# Patient Record
Sex: Male | Born: 1955 | Race: White | Hispanic: No | Smoking: Light tobacco smoker
Health system: Southern US, Community
[De-identification: ages and names within clinical notes are randomized; demographics above are authoritative.]

## PROBLEM LIST (undated history)

## (undated) DIAGNOSIS — G039 Meningitis, unspecified: Secondary | ICD-10-CM

## (undated) DIAGNOSIS — Z720 Tobacco use: Secondary | ICD-10-CM

## (undated) DIAGNOSIS — E039 Hypothyroidism, unspecified: Secondary | ICD-10-CM

## (undated) DIAGNOSIS — J189 Pneumonia, unspecified organism: Secondary | ICD-10-CM

## (undated) DIAGNOSIS — I471 Supraventricular tachycardia, unspecified: Secondary | ICD-10-CM

## (undated) DIAGNOSIS — I639 Cerebral infarction, unspecified: Secondary | ICD-10-CM

## (undated) DIAGNOSIS — I739 Peripheral vascular disease, unspecified: Secondary | ICD-10-CM

## (undated) DIAGNOSIS — I10 Essential (primary) hypertension: Secondary | ICD-10-CM

## (undated) HISTORY — DX: Meningitis, unspecified: G03.9

## (undated) HISTORY — PX: NO PAST SURGERIES: SHX2092

## (undated) HISTORY — DX: Pneumonia, unspecified organism: J18.9

## (undated) HISTORY — DX: Essential (primary) hypertension: I10

---

## 1999-02-28 ENCOUNTER — Encounter: Payer: Self-pay | Admitting: Emergency Medicine

## 1999-02-28 ENCOUNTER — Emergency Department (HOSPITAL_COMMUNITY): Admission: EM | Admit: 1999-02-28 | Discharge: 1999-02-28 | Payer: Self-pay | Admitting: Emergency Medicine

## 2016-03-15 ENCOUNTER — Ambulatory Visit: Payer: Self-pay | Admitting: Physician Assistant

## 2016-03-21 ENCOUNTER — Encounter: Payer: Self-pay | Admitting: Physician Assistant

## 2016-03-21 ENCOUNTER — Ambulatory Visit: Payer: Self-pay | Admitting: Physician Assistant

## 2016-03-21 VITALS — BP 146/88 | HR 82 | Temp 98.1°F | Ht 68.0 in | Wt 221.6 lb

## 2016-03-21 DIAGNOSIS — R609 Edema, unspecified: Secondary | ICD-10-CM | POA: Insufficient documentation

## 2016-03-21 DIAGNOSIS — F17219 Nicotine dependence, cigarettes, with unspecified nicotine-induced disorders: Secondary | ICD-10-CM | POA: Insufficient documentation

## 2016-03-21 DIAGNOSIS — R030 Elevated blood-pressure reading, without diagnosis of hypertension: Secondary | ICD-10-CM

## 2016-03-21 DIAGNOSIS — E039 Hypothyroidism, unspecified: Secondary | ICD-10-CM

## 2016-03-21 DIAGNOSIS — I739 Peripheral vascular disease, unspecified: Secondary | ICD-10-CM | POA: Insufficient documentation

## 2016-03-21 DIAGNOSIS — E669 Obesity, unspecified: Secondary | ICD-10-CM

## 2016-03-21 NOTE — Patient Instructions (Signed)
Smoking Cessation, Tips for Success If you are ready to quit smoking, congratulations! You have chosen to help yourself be healthier. Cigarettes bring nicotine, tar, carbon monoxide, and other irritants into your body. Your lungs, heart, and blood vessels will be able to work better without these poisons. There are many different ways to quit smoking. Nicotine gum, nicotine patches, a nicotine inhaler, or nicotine nasal spray can help with physical craving. Hypnosis, support groups, and medicines help break the habit of smoking. WHAT THINGS CAN I DO TO MAKE QUITTING EASIER?  Here are some tips to help you quit for good:  Pick a date when you will quit smoking completely. Tell all of your friends and family about your plan to quit on that date.  Do not try to slowly cut down on the number of cigarettes you are smoking. Pick a quit date and quit smoking completely starting on that day.  Throw away all cigarettes.   Clean and remove all ashtrays from your home, work, and car.  On a card, write down your reasons for quitting. Carry the card with you and read it when you get the urge to smoke.  Cleanse your body of nicotine. Drink enough water and fluids to keep your urine clear or pale yellow. Do this after quitting to flush the nicotine from your body.  Learn to predict your moods. Do not let a bad situation be your excuse to have a cigarette. Some situations in your life might tempt you into wanting a cigarette.  Never have "just one" cigarette. It leads to wanting another and another. Remind yourself of your decision to quit.  Change habits associated with smoking. If you smoked while driving or when feeling stressed, try other activities to replace smoking. Stand up when drinking your coffee. Brush your teeth after eating. Sit in a different chair when you read the paper. Avoid alcohol while trying to quit, and try to drink fewer caffeinated beverages. Alcohol and caffeine may urge you to  smoke.  Avoid foods and drinks that can trigger a desire to smoke, such as sugary or spicy foods and alcohol.  Ask people who smoke not to smoke around you.  Have something planned to do right after eating or having a cup of coffee. For example, plan to take a walk or exercise.  Try a relaxation exercise to calm you down and decrease your stress. Remember, you may be tense and nervous for the first 2 weeks after you quit, but this will pass.  Find new activities to keep your hands busy. Play with a pen, coin, or rubber band. Doodle or draw things on paper.  Brush your teeth right after eating. This will help cut down on the craving for the taste of tobacco after meals. You can also try mouthwash.   Use oral substitutes in place of cigarettes. Try using lemon drops, carrots, cinnamon sticks, or chewing gum. Keep them handy so they are available when you have the urge to smoke.  When you have the urge to smoke, try deep breathing.  Designate your home as a nonsmoking area.  If you are a heavy smoker, ask your health care provider about a prescription for nicotine chewing gum. It can ease your withdrawal from nicotine.  Reward yourself. Set aside the cigarette money you save and buy yourself something nice.  Look for support from others. Join a support group or smoking cessation program. Ask someone at home or at work to help you with your plan   to quit smoking.  Always ask yourself, "Do I need this cigarette or is this just a reflex?" Tell yourself, "Today, I choose not to smoke," or "I do not want to smoke." You are reminding yourself of your decision to quit.  Do not replace cigarette smoking with electronic cigarettes (commonly called e-cigarettes). The safety of e-cigarettes is unknown, and some may contain harmful chemicals.  If you relapse, do not give up! Plan ahead and think about what you will do the next time you get the urge to smoke. HOW WILL I FEEL WHEN I QUIT SMOKING? You  may have symptoms of withdrawal because your body is used to nicotine (the addictive substance in cigarettes). You may crave cigarettes, be irritable, feel very hungry, cough often, get headaches, or have difficulty concentrating. The withdrawal symptoms are only temporary. They are strongest when you first quit but will go away within 10-14 days. When withdrawal symptoms occur, stay in control. Think about your reasons for quitting. Remind yourself that these are signs that your body is healing and getting used to being without cigarettes. Remember that withdrawal symptoms are easier to treat than the major diseases that smoking can cause.  Even after the withdrawal is over, expect periodic urges to smoke. However, these cravings are generally short lived and will go away whether you smoke or not. Do not smoke! WHAT RESOURCES ARE AVAILABLE TO HELP ME QUIT SMOKING? Your health care provider can direct you to community resources or hospitals for support, which may include:  Group support.  Education.  Hypnosis.  Therapy.   This information is not intended to replace advice given to you by your health care provider. Make sure you discuss any questions you have with your health care provider.   Document Released: 09/01/2004 Document Revised: 12/25/2014 Document Reviewed: 05/22/2013 Elsevier Interactive Patient Education 2016 [employer]  

## 2016-03-21 NOTE — Progress Notes (Signed)
BP 146/88 mmHg  Pulse 82  Temp(Src) 98.1 F (36.7 C)  Ht  (1.727 m)  Wt 221 lb 9.6 oz (100.517 kg)  BMI 33.70 kg/m2  SpO2 97%   Subjective:    Patient ID: David Mosley, male    DOB: 12-Jul-1956, 60 y.o.   MRN: [id number]  HPI: David Mosley is a 60 y.o. male presenting on 03/21/2016 for Hypothyroidism   HPI   Pt given iFOBT 08/17/2015 and never returned it  He says he is doing well mostly  Relevant past medical, surgical, family and social history reviewed and updated as indicated. Interim medical history since our last visit reviewed. Allergies and medications reviewed and updated.   Current outpatient prescriptions:  .  levothyroxine (SYNTHROID, LEVOTHROID) 100 MCG tablet, Take 100 mcg by mouth daily., Disp: , Rfl:    Review of Systems  Constitutional: Negative for fever, chills, diaphoresis, appetite change, fatigue and unexpected weight change.  HENT: Positive for hearing loss, sneezing and voice change. Negative for congestion, dental problem, drooling, ear pain, facial swelling, mouth sores, sore throat and trouble swallowing.   Eyes: Negative for pain, discharge, redness, itching and visual disturbance.  Respiratory: Negative for cough, choking, shortness of breath and wheezing.   Cardiovascular: Positive for palpitations and leg swelling. Negative for chest pain.  Gastrointestinal: Positive for constipation. Negative for vomiting, abdominal pain, diarrhea and blood in stool.  Endocrine: Negative for cold intolerance, heat intolerance and polydipsia.  Genitourinary: Negative for dysuria, hematuria and decreased urine volume.  Musculoskeletal: Positive for arthralgias. Negative for back pain and gait problem.  Skin: Negative for rash.  Allergic/Immunologic: Negative for environmental allergies.  Neurological: Negative for seizures, syncope, light-headedness and headaches.  Hematological: Negative for adenopathy.  Psychiatric/Behavioral: Negative for suicidal ideas,  dysphoric mood and agitation. The patient is not nervous/anxious.     Per HPI unless specifically indicated above     Objective:    BP 146/88 mmHg  Pulse 82  Temp(Src) 98.1 F (36.7 C)  Ht  (1.727 m)  Wt 221 lb 9.6 oz (100.517 kg)  BMI 33.70 kg/m2  SpO2 97%  Wt Readings from Last 3 Encounters:  03/21/16 221 lb 9.6 oz (100.517 kg)    Physical Exam  Constitutional: He is oriented to person, place, and time. He appears well-developed and well-nourished.  HENT:  Head: Normocephalic and atraumatic.  Neck: Neck supple.  Cardiovascular: Normal rate and regular rhythm.   Pulses:      Dorsalis pedis pulses are 2+ on the right side, and 2+ on the left side.       Posterior tibial pulses are 2+ on the right side, and 2+ on the left side.  Pulmonary/Chest: Effort normal and breath sounds normal. He has no wheezes.  Abdominal: Soft. Bowel sounds are normal. There is no hepatosplenomegaly. There is no tenderness.  Musculoskeletal:       Right lower leg: He exhibits edema.       Left lower leg: He exhibits edema.  Varicosities BLE, L >> R  Lymphadenopathy:    He has no cervical adenopathy.  Neurological: He is alert and oriented to person, place, and time.  Skin: Skin is warm and dry.  Psychiatric: He has a normal mood and affect. His behavior is normal.  Vitals reviewed.    No results found for this or any previous visit.    Assessment & Plan:   Encounter Diagnoses  Name Primary?  . Hypothyroidism, unspecified hypothyroidism type Yes  . Edema,  unspecified type   . PVD (peripheral vascular disease) (HCC)   . Cigarette nicotine dependence with nicotine-induced disorder   . Obesity, unspecified   . Elevated blood pressure reading without diagnosis of hypertension     -Order Echo due to edema -counseled on Smoking cessation- pt not interested in stopping -Gave [employer] discount application.  Pt is to notify our office when he turns it in so his echo can be scheduled -pt to  Elevate legs. Walk regularly -f/u 1 month to  recheck bp and review echo.  Check tsh before appointment.

## 2016-04-12 ENCOUNTER — Other Ambulatory Visit: Payer: Self-pay

## 2016-04-12 DIAGNOSIS — E039 Hypothyroidism, unspecified: Secondary | ICD-10-CM

## 2016-04-12 DIAGNOSIS — R030 Elevated blood-pressure reading, without diagnosis of hypertension: Secondary | ICD-10-CM

## 2016-04-12 DIAGNOSIS — I739 Peripheral vascular disease, unspecified: Secondary | ICD-10-CM

## 2016-04-12 DIAGNOSIS — R609 Edema, unspecified: Secondary | ICD-10-CM

## 2016-04-20 ENCOUNTER — Ambulatory Visit: Payer: Self-pay | Admitting: Physician Assistant

## 2016-04-20 VITALS — BP 160/80 | HR 72 | Temp 98.4°F | Ht 68.0 in | Wt 221.6 lb

## 2016-04-20 DIAGNOSIS — R609 Edema, unspecified: Secondary | ICD-10-CM

## 2016-04-20 DIAGNOSIS — I10 Essential (primary) hypertension: Secondary | ICD-10-CM

## 2016-04-20 DIAGNOSIS — F17219 Nicotine dependence, cigarettes, with unspecified nicotine-induced disorders: Secondary | ICD-10-CM

## 2016-04-20 MED ORDER — LISINOPRIL 10 MG PO TABS: 10.0000 mg | ORAL_TABLET | Freq: Every day | ORAL | Status: DC

## 2016-04-20 MED ORDER — LEVOTHYROXINE SODIUM 100 MCG PO TABS: 100.0000 ug | ORAL_TABLET | Freq: Every day | ORAL | Status: DC

## 2016-04-20 NOTE — Progress Notes (Signed)
BP 160/80 mmHg  Pulse 72  Temp(Src) 98.4 F (36.9 C)  Ht 5\' 8"  (1.727 m)  Wt 221 lb 9.6 oz (100.517 kg)  BMI 33.70 kg/m2  SpO2 98%   Subjective:    Patient ID: David Mosley, male    DOB: 08/08/1956, 60 y.o.   MRN: [id number]  HPI: David Mosley is a 60 y.o. male presenting on 04/20/2016 for Hypertension and Edema   HPI  Pt says he was on medication for bp but it was a long time ago and ihe doesn't remember what it was.  He is still smoking  Pt says he sent his son to turn in the [employer] discount application right after last OV.  He says he hasn't heard anything on it yet.  Relevant past medical, surgical, family and social history reviewed and updated as indicated. Interim medical history since our last visit reviewed. Allergies and medications reviewed and updated.   Current outpatient prescriptions:  .  levothyroxine (SYNTHROID, LEVOTHROID) 100 MCG tablet, Take 100 mcg by mouth daily., Disp: , Rfl:    Review of Systems  Constitutional: Negative for fever, chills, diaphoresis, appetite change, fatigue and unexpected weight change.  HENT: Negative for congestion, dental problem, drooling, ear pain, facial swelling, hearing loss, mouth sores, sneezing, sore throat, trouble swallowing and voice change.   Eyes: Negative for pain, discharge, redness, itching and visual disturbance.  Respiratory: Negative for cough, choking, shortness of breath and wheezing.   Cardiovascular: Negative for chest pain, palpitations and leg swelling.  Gastrointestinal: Negative for vomiting, abdominal pain, diarrhea, constipation and blood in stool.  Endocrine: Negative for cold intolerance, heat intolerance and polydipsia.  Genitourinary: Negative for dysuria, hematuria and decreased urine volume.  Musculoskeletal: Negative for back pain, arthralgias and gait problem.  Skin: Negative for rash.  Allergic/Immunologic: Negative for environmental allergies.  Neurological: Negative for seizures, syncope,  light-headedness and headaches.  Hematological: Negative for adenopathy.  Psychiatric/Behavioral: Negative for suicidal ideas, dysphoric mood and agitation. The patient is not nervous/anxious.        Per HPI unless specifically indicated above     Objective:    BP 160/80 mmHg  Pulse 72  Temp(Src) 98.4 F (36.9 C)  Ht 5\' 8"  (1.727 m)  Wt 221 lb 9.6 oz (100.517 kg)  BMI 33.70 kg/m2  SpO2 98%  Wt Readings from Last 3 Encounters:  04/20/16 221 lb 9.6 oz (100.517 kg)  03/21/16 221 lb 9.6 oz (100.517 kg)    Physical Exam  Constitutional: He is oriented to person, place, and time. He appears well-developed and well-nourished.  HENT:  Head: Normocephalic and atraumatic.  Neck: Neck supple.  Cardiovascular: Normal rate and regular rhythm.   Pulmonary/Chest: Effort normal and breath sounds normal. He has no wheezes.  Abdominal: Soft. Bowel sounds are normal. There is no hepatosplenomegaly. There is no tenderness.  Musculoskeletal: He exhibits edema.  Lymphadenopathy:    He has no cervical adenopathy.  Neurological: He is alert and oriented to person, place, and time.  Skin: Skin is warm and dry.  Psychiatric: He has a normal mood and affect. His behavior is normal.  Vitals reviewed.   No results found for this or any previous visit.    Assessment & Plan:   Encounter Diagnoses  Name Primary?  . Essential hypertension, benign Yes  . Edema, unspecified type   . Cigarette nicotine dependence with nicotine-induced disorder    -rx lisinopril -Get labs drawn -Schedule echo -F/u 1 month recheck bp and review labs, echo

## 2016-04-21 ENCOUNTER — Telehealth: Payer: Self-pay | Admitting: Student

## 2016-04-21 LAB — COMPLETE METABOLIC PANEL WITH GFR
ALT: 27 U/L (ref 9–46)
AST: 24 U/L (ref 10–35)
Albumin: 3.6 g/dL (ref 3.6–5.1)
Alkaline Phosphatase: 45 U/L (ref 40–115)
BUN: 15 mg/dL (ref 7–25)
CO2: 29 mmol/L (ref 20–31)
Calcium: 8.9 mg/dL (ref 8.6–10.3)
Chloride: 100 mmol/L (ref 98–110)
Creat: 1.12 mg/dL (ref 0.70–1.33)
GFR, EST AFRICAN AMERICAN: 83 mL/min (ref >=60)
GFR, Est Non African American: 72 mL/min (ref >=60)
Glucose, Bld: 95 mg/dL (ref 65–99)
Potassium: 4.5 mmol/L (ref 3.5–5.3)
Sodium: 137 mmol/L (ref 135–146)
Total Bilirubin: 0.5 mg/dL (ref 0.2–1.2)
Total Protein: 6.9 g/dL (ref 6.1–8.1)

## 2016-04-21 LAB — TSH: TSH: 1.87 mIU/L (ref 0.40–4.50)

## 2016-04-21 NOTE — Telephone Encounter (Signed)
-----   Message from Jacquelin HawkingShannon McElroy, [place] sent at 04/20/2016  1:59 PM EDT ----- Please schedule his echo- he says he turned in his [employer] discount application. thanks

## 2016-04-21 NOTE — Telephone Encounter (Signed)
[facility] and is to register at 1015. Pt has been notified of appt via phone.

## 2016-04-22 ENCOUNTER — Encounter: Payer: Self-pay | Admitting: Physician Assistant

## 2016-04-26 ENCOUNTER — Ambulatory Visit (HOSPITAL_COMMUNITY)
Admission: RE | Admit: 2016-04-26 | Discharge: 2016-04-26 | Disposition: A | Discharge status: Home / Self Care | Payer: Self-pay | Source: Ambulatory Visit | Attending: Physician Assistant | Admitting: Physician Assistant

## 2016-04-26 DIAGNOSIS — I517 Cardiomegaly: Secondary | ICD-10-CM | POA: Insufficient documentation

## 2016-04-26 DIAGNOSIS — R609 Edema, unspecified: Secondary | ICD-10-CM | POA: Insufficient documentation

## 2016-04-26 DIAGNOSIS — I059 Rheumatic mitral valve disease, unspecified: Secondary | ICD-10-CM | POA: Insufficient documentation

## 2016-04-26 DIAGNOSIS — Z720 Tobacco use: Secondary | ICD-10-CM | POA: Insufficient documentation

## 2016-04-26 DIAGNOSIS — I358 Other nonrheumatic aortic valve disorders: Secondary | ICD-10-CM | POA: Insufficient documentation

## 2016-05-11 ENCOUNTER — Other Ambulatory Visit: Payer: Self-pay | Admitting: Physician Assistant

## 2016-05-23 ENCOUNTER — Ambulatory Visit: Payer: Self-pay | Admitting: Physician Assistant

## 2016-05-23 ENCOUNTER — Other Ambulatory Visit: Payer: Self-pay | Admitting: Physician Assistant

## 2016-05-23 MED ORDER — LEVOTHYROXINE SODIUM 100 MCG PO TABS: 100.0000 ug | ORAL_TABLET | Freq: Every day | ORAL | Status: DC

## 2016-05-29 ENCOUNTER — Ambulatory Visit: Payer: Self-pay | Admitting: Physician Assistant

## 2016-05-29 ENCOUNTER — Encounter: Payer: Self-pay | Admitting: Physician Assistant

## 2016-05-29 VITALS — BP 150/90 | HR 83 | Temp 98.1°F | Ht 68.0 in | Wt 223.8 lb

## 2016-05-29 DIAGNOSIS — I739 Peripheral vascular disease, unspecified: Secondary | ICD-10-CM

## 2016-05-29 DIAGNOSIS — I10 Essential (primary) hypertension: Secondary | ICD-10-CM | POA: Insufficient documentation

## 2016-05-29 DIAGNOSIS — F17219 Nicotine dependence, cigarettes, with unspecified nicotine-induced disorders: Secondary | ICD-10-CM

## 2016-05-29 DIAGNOSIS — R609 Edema, unspecified: Secondary | ICD-10-CM

## 2016-05-29 NOTE — Progress Notes (Signed)
BP 150/90 mmHg  Pulse 83  Temp(Src) 98.1 F (36.7 C)  Ht  (1.727 m)  Wt 223 lb 12.8 oz (101.515 kg)  BMI 34.04 kg/m2  SpO2 97%   Subjective:    Patient ID: David Mosley, male    DOB: 1956-06-22, 60 y.o.   MRN: [id number]  HPI: David Mosley is a 60 y.o. male presenting on 05/29/2016 for Hypertension   HPI Pt attributes high bp today to he just ate bacn and sausage  Relevant past medical, surgical, family and social history reviewed and updated as indicated. Interim medical history since our last visit reviewed. Allergies and medications reviewed and updated.   Current outpatient prescriptions:  .  levothyroxine (SYNTHROID, LEVOTHROID) 100 MCG tablet, Take 1 tablet (100 mcg total) by mouth daily., Disp: 30 tablet, Rfl: 1 .  lisinopril (ZESTRIL) 10 MG tablet, Take 1 tablet (10 mg total) by mouth daily., Disp: 30 tablet, Rfl: 1   Review of Systems  Constitutional: Negative for fever, chills, diaphoresis, appetite change, fatigue and unexpected weight change.  HENT: Negative for congestion, dental problem, drooling, ear pain, facial swelling, hearing loss, mouth sores, sneezing, sore throat, trouble swallowing and voice change.   Eyes: Negative for pain, discharge, redness, itching and visual disturbance.  Respiratory: Negative for cough, choking, shortness of breath and wheezing.   Cardiovascular: Positive for leg swelling. Negative for chest pain and palpitations.  Gastrointestinal: Negative for vomiting, abdominal pain, diarrhea, constipation and blood in stool.  Endocrine: Negative for cold intolerance, heat intolerance and polydipsia.  Genitourinary: Negative for dysuria, hematuria and decreased urine volume.  Musculoskeletal: Negative for back pain, arthralgias and gait problem.  Skin: Negative for rash.  Allergic/Immunologic: Negative for environmental allergies.  Neurological: Negative for seizures, syncope, light-headedness and headaches.  Hematological: Negative for  adenopathy.  Psychiatric/Behavioral: Negative for suicidal ideas, dysphoric mood and agitation. The patient is not nervous/anxious.     Per HPI unless specifically indicated above     Objective:    BP 150/90 mmHg  Pulse 83  Temp(Src) 98.1 F (36.7 C)  Ht  (1.727 m)  Wt 223 lb 12.8 oz (101.515 kg)  BMI 34.04 kg/m2  SpO2 97%  Wt Readings from Last 3 Encounters:  05/29/16 223 lb 12.8 oz (101.515 kg)  04/20/16 221 lb 9.6 oz (100.517 kg)  03/21/16 221 lb 9.6 oz (100.517 kg)    Physical Exam  Constitutional: He is oriented to person, place, and time. He appears well-developed and well-nourished.  HENT:  Head: Normocephalic and atraumatic.  Neck: Neck supple.  Cardiovascular: Normal rate and regular rhythm.   Pulmonary/Chest: Effort normal and breath sounds normal. He has no wheezes.  Abdominal: Soft. Bowel sounds are normal. There is no hepatosplenomegaly. There is no tenderness.  Musculoskeletal: He exhibits edema (trace - 1+ BLE edema).  Lymphadenopathy:    He has no cervical adenopathy.  Neurological: He is alert and oriented to person, place, and time.  Skin: Skin is warm and dry.  Psychiatric: He has a normal mood and affect. His behavior is normal.  Vitals reviewed.   Results for orders placed or performed in visit on 04/12/16  COMPLETE METABOLIC PANEL WITH GFR  Result Value Ref Range   Sodium 137 135 - 146 mmol/L   Potassium 4.5 3.5 - 5.3 mmol/L   Chloride 100 98 - 110 mmol/L   CO2 29 20 - 31 mmol/L   Glucose, Bld 95 65 - 99 mg/dL   BUN 15 7 - 25 mg/dL  Creat 1.12 0.70 - 1.33 mg/dL   Total Bilirubin 0.5 0.2 - 1.2 mg/dL   Alkaline Phosphatase 45 40 - 115 U/L   AST 24 10 - 35 U/L   ALT 27 9 - 46 U/L   Total Protein 6.9 6.1 - 8.1 g/dL   Albumin 3.6 3.6 - 5.1 g/dL   Calcium 8.9 8.6 - 52.810.3 mg/dL   GFR, Est African American 83 >=60 mL/min   GFR, Est Non African American 72 >=60 mL/min  TSH  Result Value Ref Range   TSH 1.87 0.40 - 4.50 mIU/L       Assessment & Plan:   Encounter Diagnoses  Name Primary?  . Edema, unspecified type Yes  . Essential hypertension, benign   . PVD (peripheral vascular disease) (HCC)   . Cigarette nicotine dependence with nicotine-induced disorder     -reviewed echo results with pt  -Counseled on diet (ie avoid sausage and bacon) -Continue current meds -counseled on diet and exercise for edema and recommended elevate legs when seated -F/u for recheck bp 1 month

## 2016-05-29 NOTE — Patient Instructions (Signed)
Heart-Healthy Eating Plan °Many factors influence your heart health, including eating and exercise habits. Heart (coronary) risk increases with abnormal blood fat (lipid) levels. Heart-healthy meal planning includes limiting unhealthy fats, increasing healthy fats, and making other small dietary changes. This includes maintaining a healthy body weight to help keep lipid levels within a normal range. °WHAT IS MY PLAN?  °Your health care provider recommends that you: °· Get no more than _________% of the total calories in your daily diet from fat. °· Limit your intake of saturated fat to less than _________% of your total calories each day. °· Limit the amount of cholesterol in your diet to less than _________ mg per day. °WHAT TYPES OF FAT SHOULD I CHOOSE? °· Choose healthy fats more often. Choose monounsaturated and polyunsaturated fats, such as olive oil and canola oil, flaxseeds, walnuts, almonds, and seeds. °· Eat more omega-3 fats. Good choices include salmon, mackerel, sardines, tuna, flaxseed oil, and ground flaxseeds. Aim to eat fish at least two times each week. °· Limit saturated fats. Saturated fats are primarily found in animal products, such as meats, butter, and cream. Plant sources of saturated fats include palm oil, palm kernel oil, and coconut oil. °· Avoid foods with partially hydrogenated oils in them. These contain trans fats. Examples of foods that contain trans fats are stick margarine, some tub margarines, cookies, crackers, and other baked goods. °WHAT GENERAL GUIDELINES DO I NEED TO FOLLOW? °· Check food labels carefully to identify foods with trans fats or high amounts of saturated fat. °· Fill one half of your plate with vegetables and green salads. Eat 4-5 servings of vegetables per day. A serving of vegetables equals 1 cup of raw leafy vegetables, ½ cup of raw or cooked cut-up vegetables, or ½ cup of vegetable juice. °· Fill one fourth of your plate with whole grains. Look for the word  "whole" as the first word in the ingredient list. °· Fill one fourth of your plate with lean protein foods. °· Eat 4-5 servings of fruit per day. A serving of fruit equals one medium whole fruit, ¼ cup of dried fruit, ½ cup of fresh, frozen, or canned fruit, or ½ cup of 100% fruit juice. °· Eat more foods that contain soluble fiber. Examples of foods that contain this type of fiber are apples, broccoli, carrots, beans, peas, and barley. Aim to get 20-30 g of fiber per day. °· Eat more home-cooked food and less restaurant, buffet, and fast food. °· Limit or avoid alcohol. °· Limit foods that are high in starch and sugar. °· Avoid fried foods. °· Cook foods by using methods other than frying. Baking, boiling, grilling, and broiling are all great options. Other fat-reducing suggestions include: °¨ Removing the skin from poultry. °¨ Removing all visible fats from meats. °¨ Skimming the fat off of stews, soups, and gravies before serving them. °¨ Steaming vegetables in water or broth. °· Lose weight if you are overweight. Losing just 5-10% of your initial body weight can help your overall health and prevent diseases such as diabetes and heart disease. °· Increase your consumption of nuts, legumes, and seeds to 4-5 servings per week. One serving of dried beans or legumes equals ½ cup after being cooked, one serving of nuts equals 1½ ounces, and one serving of seeds equals ½ ounce or 1 tablespoon. °· You may need to monitor your salt (sodium) intake, especially if you have high blood pressure. Talk with your health care provider or dietitian to get   more information about reducing sodium. °WHAT FOODS CAN I EAT? °Grains °Breads, including French, white, pita, wheat, raisin, rye, oatmeal, and Italian. Tortillas that are neither fried nor made with lard or trans fat. Low-fat rolls, including hotdog and hamburger buns and English muffins. Biscuits. Muffins. Waffles. Pancakes. Light popcorn. Whole-grain cereals. Flatbread. Melba  toast. Pretzels. Breadsticks. Rusks. Low-fat snacks and crackers, including oyster, saltine, matzo, graham, animal, and rye. Rice and pasta, including brown rice and those that are made with whole wheat. °Vegetables °All vegetables. °Fruits °All fruits, but limit coconut. °Meats and Other Protein Sources °Lean, well-trimmed beef, veal, pork, and lamb. Chicken and turkey without skin. All fish and shellfish. Wild duck, rabbit, pheasant, and venison. Egg whites or low-cholesterol egg substitutes. Dried beans, peas, lentils, and tofu. Seeds and most nuts. °Dairy °Low-fat or nonfat cheeses, including ricotta, string, and mozzarella. Skim or 1% milk that is liquid, powdered, or evaporated. Buttermilk that is made with low-fat milk. Nonfat or low-fat yogurt. °Beverages °Mineral water. Diet carbonated beverages. °Sweets and Desserts °Sherbets and fruit ices. Honey, jam, marmalade, jelly, and syrups. Meringues and gelatins. Pure sugar candy, such as hard candy, jelly beans, gumdrops, mints, marshmallows, and small amounts of dark chocolate. Angel food cake. °Eat all sweets and desserts in moderation. °Fats and Oils °Nonhydrogenated (trans-free) margarines. Vegetable oils, including soybean, sesame, sunflower, olive, peanut, safflower, corn, canola, and cottonseed. Salad dressings or mayonnaise that are made with a vegetable oil. Limit added fats and oils that you use for cooking, baking, salads, and as spreads. °Other °Cocoa powder. Coffee and tea. All seasonings and condiments. °The items listed above may not be a complete list of recommended foods or beverages. Contact your dietitian for more options. °WHAT FOODS ARE NOT RECOMMENDED? °Grains °Breads that are made with saturated or trans fats, oils, or whole milk. Croissants. Butter rolls. Cheese breads. Sweet rolls. Donuts. Buttered popcorn. Chow mein noodles. High-fat crackers, such as cheese or butter crackers. °Meats and Other Protein Sources °Fatty meats, such as  hotdogs, short ribs, sausage, spareribs, bacon, ribeye roast or steak, and mutton. High-fat deli meats, such as salami and bologna. Caviar. Domestic duck and goose. Organ meats, such as kidney, liver, sweetbreads, brains, gizzard, chitterlings, and heart. °Dairy °Cream, sour cream, cream cheese, and creamed cottage cheese. Whole milk cheeses, including blue (bleu), Monterey Jack, Brie, [place], American, Havarti, Swiss, cheddar, Camembert, and [place].  Whole or 2% milk that is liquid, evaporated, or condensed. Whole buttermilk. Cream sauce or high-fat cheese sauce. Yogurt that is made from whole milk. °Beverages °Regular sodas and drinks with added sugar. °Sweets and Desserts °Frosting. Pudding. Cookies. Cakes other than angel food cake. Candy that has milk chocolate or white chocolate, hydrogenated fat, butter, coconut, or unknown ingredients. Buttered syrups. Full-fat ice cream or ice cream drinks. °Fats and Oils °Gravy that has suet, meat fat, or shortening. Cocoa butter, hydrogenated oils, palm oil, coconut oil, palm kernel oil. These can often be found in baked products, candy, fried foods, nondairy creamers, and whipped toppings. Solid fats and shortenings, including bacon fat, salt pork, lard, and butter. Nondairy cream substitutes, such as coffee creamers and sour cream substitutes. Salad dressings that are made of unknown oils, cheese, or sour cream. °The items listed above may not be a complete list of foods and beverages to avoid. Contact your dietitian for more information. °  °This information is not intended to replace advice given to you by your health care provider. Make sure you discuss any questions you have with your health   care provider. °  °Document Released: 09/12/2008 Document Revised: 12/25/2014 Document Reviewed: 05/28/2014 °Elsevier Interactive Patient Education ©2016 [employer] ° °

## 2016-06-17 ENCOUNTER — Other Ambulatory Visit: Payer: Self-pay | Admitting: Physician Assistant

## 2016-06-28 ENCOUNTER — Encounter: Payer: Self-pay | Admitting: Physician Assistant

## 2016-06-28 ENCOUNTER — Ambulatory Visit: Payer: Self-pay | Admitting: Physician Assistant

## 2016-06-28 VITALS — BP 112/66 | HR 81 | Temp 98.1°F | Ht 68.0 in | Wt 220.0 lb

## 2016-06-28 DIAGNOSIS — I10 Essential (primary) hypertension: Secondary | ICD-10-CM

## 2016-06-28 DIAGNOSIS — E039 Hypothyroidism, unspecified: Secondary | ICD-10-CM

## 2016-06-28 DIAGNOSIS — F17210 Nicotine dependence, cigarettes, uncomplicated: Secondary | ICD-10-CM

## 2016-06-28 MED ORDER — LISINOPRIL 10 MG PO TABS: 10.0000 mg | ORAL_TABLET | Freq: Every day | ORAL | Status: DC

## 2016-06-28 NOTE — Progress Notes (Signed)
   BP 112/66 mmHg  Pulse 81  Temp(Src) 98.1 F (36.7 C)  Ht 5\' 8"  (1.727 m)  Wt 220 lb (99.791 kg)  BMI 33.46 kg/m2  SpO2 98%   Subjective:    Patient ID: David Mosley, male    DOB: 11/15/1956, 60 y.o.   MRN: [id number]  HPI: David Mosley is a 60 y.o. male presenting on 06/28/2016 for Hypertension   HPI   Pt is feeling well today.  He says he is eating low-sodium diet now.  Relevant past medical, surgical, family and social history reviewed and updated as indicated. Interim medical history since our last visit reviewed. Allergies and medications reviewed and updated.  Current outpatient prescriptions:  .  Fexofenadine HCl (ALLEGRA PO), Take 2 tablets by mouth daily., Disp: , Rfl:  .  levothyroxine (SYNTHROID, LEVOTHROID) 100 MCG tablet, TAKE ONE TABLET BY MOUTH ONCE DAILY, Disp: 30 tablet, Rfl: 0 .  lisinopril (PRINIVIL,ZESTRIL) 10 MG tablet, Take 1 tablet (10 mg total) by mouth daily., Disp: 30 tablet, Rfl: 3  Review of Systems  Constitutional: Negative for fever, chills, diaphoresis, appetite change, fatigue and unexpected weight change.  HENT: Negative for congestion, dental problem, drooling, ear pain, facial swelling, hearing loss, mouth sores, sneezing, sore throat, trouble swallowing and voice change.   Eyes: Negative for pain, discharge, redness, itching and visual disturbance.  Respiratory: Negative for cough, choking, shortness of breath and wheezing.   Cardiovascular: Negative for chest pain, palpitations and leg swelling.  Gastrointestinal: Negative for vomiting, abdominal pain, diarrhea, constipation and blood in stool.  Endocrine: Negative for cold intolerance, heat intolerance and polydipsia.  Genitourinary: Negative for dysuria, hematuria and decreased urine volume.  Musculoskeletal: Negative for back pain, arthralgias and gait problem.  Skin: Negative for rash.  Allergic/Immunologic: Positive for environmental allergies.  Neurological: Negative for seizures,  syncope, light-headedness and headaches.  Hematological: Negative for adenopathy.  Psychiatric/Behavioral: Negative for suicidal ideas, dysphoric mood and agitation. The patient is not nervous/anxious.     Per HPI unless specifically indicated above     Objective:    BP 112/66 mmHg  Pulse 81  Temp(Src) 98.1 F (36.7 C)  Ht 5\' 8"  (1.727 m)  Wt 220 lb (99.791 kg)  BMI 33.46 kg/m2  SpO2 98%  Wt Readings from Last 3 Encounters:  06/28/16 220 lb (99.791 kg)  05/29/16 223 lb 12.8 oz (101.515 kg)  04/20/16 221 lb 9.6 oz (100.517 kg)    Physical Exam  Constitutional: He is oriented to person, place, and time. He appears well-developed and well-nourished.  HENT:  Head: Normocephalic and atraumatic.  Neck: Neck supple.  Cardiovascular: Normal rate and regular rhythm.   Pulmonary/Chest: Effort normal and breath sounds normal. He has no wheezes.  Abdominal: Soft. Bowel sounds are normal. There is no hepatosplenomegaly. There is no tenderness.  Musculoskeletal: He exhibits no edema.  Lymphadenopathy:    He has no cervical adenopathy.  Neurological: He is alert and oriented to person, place, and time.  Skin: Skin is warm and dry.  Psychiatric: He has a normal mood and affect. His behavior is normal.  Vitals reviewed.       Assessment & Plan:   Encounter Diagnoses  Name Primary?  . Essential hypertension, benign Yes  . Hypothyroidism, unspecified hypothyroidism type   . Cigarette nicotine dependence without complication     -continue current medications. Stay off salt -f/u 3 months.  RTO sooner prn.

## 2016-07-14 ENCOUNTER — Other Ambulatory Visit: Payer: Self-pay | Admitting: Physician Assistant

## 2016-09-18 ENCOUNTER — Ambulatory Visit: Payer: Self-pay

## 2016-09-28 ENCOUNTER — Ambulatory Visit: Payer: Self-pay | Admitting: Physician Assistant

## 2016-10-03 ENCOUNTER — Ambulatory Visit: Payer: Self-pay | Admitting: Physician Assistant

## 2016-10-03 ENCOUNTER — Encounter: Payer: Self-pay | Admitting: Physician Assistant

## 2016-10-03 VITALS — BP 150/72 | HR 77 | Temp 98.1°F | Ht 68.0 in | Wt 218.6 lb

## 2016-10-03 DIAGNOSIS — F17210 Nicotine dependence, cigarettes, uncomplicated: Secondary | ICD-10-CM

## 2016-10-03 DIAGNOSIS — I10 Essential (primary) hypertension: Secondary | ICD-10-CM

## 2016-10-03 DIAGNOSIS — I739 Peripheral vascular disease, unspecified: Secondary | ICD-10-CM

## 2016-10-03 DIAGNOSIS — Z125 Encounter for screening for malignant neoplasm of prostate: Secondary | ICD-10-CM

## 2016-10-03 DIAGNOSIS — K219 Gastro-esophageal reflux disease without esophagitis: Secondary | ICD-10-CM

## 2016-10-03 DIAGNOSIS — L0291 Cutaneous abscess, unspecified: Secondary | ICD-10-CM

## 2016-10-03 DIAGNOSIS — E039 Hypothyroidism, unspecified: Secondary | ICD-10-CM

## 2016-10-03 LAB — HEMOGLOBIN: HEMOGLOBIN: 13.6 g/dL (ref 13.2–17.1)

## 2016-10-03 MED ORDER — SULFAMETHOXAZOLE-TRIMETHOPRIM 800-160 MG PO TABS: 1.0000 | ORAL_TABLET | Freq: Two times a day (BID) | ORAL | 0 refills | Status: AC

## 2016-10-03 MED ORDER — RANITIDINE HCL 300 MG PO TABS: 300.0000 mg | ORAL_TABLET | Freq: Every day | ORAL | 4 refills | Status: DC

## 2016-10-03 NOTE — Progress Notes (Signed)
BP (!) 150/72 (BP Location: Left Arm, Patient Position: Sitting, Cuff Size: Normal)   Pulse 77   Temp 98.1 F (36.7 C)   Ht 5\' 8"  (1.727 m)   Wt 218 lb 9.6 oz (99.2 kg)   SpO2 98%   BMI 33.24 kg/m    Subjective:    Patient ID: David Mosley, male    DOB: 02/05/1956, 60 y.o.   MRN: [id number]  HPI: David Mosley is a 60 y.o. male presenting on 10/03/2016 for Hypertension and Thyroid Problem   HPI  Pt says he is not eating salt except what is already in food.  Pt c/o "boil" on back that began hurting 2-3 days ago.  Discussed risks/benefits of I&D and pt requests to proceed.  Informed consent is signed and witnessed by his ex-wife who is with him today.   Relevant past medical, surgical, family and social history reviewed and updated as indicated. Interim medical history since our last visit reviewed. Allergies and medications reviewed and updated.   Current Outpatient Prescriptions:  .  Fexofenadine HCl (ALLEGRA PO), Take 2 tablets by mouth daily., Disp: , Rfl:  .  levothyroxine (SYNTHROID, LEVOTHROID) 100 MCG tablet, TAKE ONE TABLET BY MOUTH ONCE DAILY, Disp: 30 tablet, Rfl: 4 .  lisinopril (PRINIVIL,ZESTRIL) 10 MG tablet, Take 1 tablet (10 mg total) by mouth daily., Disp: 30 tablet, Rfl: 3  Review of Systems  Constitutional: Positive for fatigue. Negative for appetite change, chills, diaphoresis, fever and unexpected weight change.  HENT: Positive for hearing loss and sneezing. Negative for congestion, dental problem, drooling, ear pain, facial swelling, mouth sores, sore throat, trouble swallowing and voice change.   Eyes: Negative for pain, discharge, redness, itching and visual disturbance.  Respiratory: Negative for cough, choking, shortness of breath and wheezing.   Cardiovascular: Positive for leg swelling. Negative for chest pain and palpitations.  Gastrointestinal: Negative for abdominal pain, blood in stool, constipation, diarrhea and vomiting.  Endocrine: Negative for  cold intolerance, heat intolerance and polydipsia.  Genitourinary: Negative for decreased urine volume, dysuria and hematuria.  Musculoskeletal: Positive for arthralgias. Negative for back pain and gait problem.  Skin: Negative for rash.  Allergic/Immunologic: Negative for environmental allergies.  Neurological: Negative for seizures, syncope, light-headedness and headaches.  Hematological: Negative for adenopathy.  Psychiatric/Behavioral: Negative for agitation, dysphoric mood and suicidal ideas. The patient is not nervous/anxious.     Per HPI unless specifically indicated above     Objective:    BP (!) 150/72 (BP Location: Left Arm, Patient Position: Sitting, Cuff Size: Normal)   Pulse 77   Temp 98.1 F (36.7 C)   Ht 5\' 8"  (1.727 m)   Wt 218 lb 9.6 oz (99.2 kg)   SpO2 98%   BMI 33.24 kg/m   Wt Readings from Last 3 Encounters:  10/03/16 218 lb 9.6 oz (99.2 kg)  06/28/16 220 lb (99.8 kg)  05/29/16 223 lb 12.8 oz (101.5 kg)    Physical Exam  Constitutional: He is oriented to person, place, and time. He appears well-developed and well-nourished.  HENT:  Head: Normocephalic and atraumatic.  Neck: Neck supple.  Cardiovascular: Normal rate and regular rhythm.   Pulmonary/Chest: Effort normal and breath sounds normal. He has no wheezes.  Abdominal: Soft. Bowel sounds are normal. There is no hepatosplenomegaly. There is no tenderness.  Musculoskeletal: He exhibits edema (trace BLE edema).  Lymphadenopathy:    He has no cervical adenopathy.  Neurological: He is alert and oriented to person, place, and time.  Skin:  Skin is warm and dry.     1.5 cm abscess.  Area cleaned with hibiclens. Instilled 1% lidocaine. Opened with #11 blade. Small pus expressed. Packed with 1/4 inch gauze.  Dry sterile dressing applied. Pt tolerated well and left room ambulating without assistance. He states improvement in pain level.   Psychiatric: He has a normal mood and affect. His behavior is normal.    Vitals reviewed.       Assessment & Plan:    Encounter Diagnoses  Name Primary?  . Essential hypertension, benign Yes  . PVD (peripheral vascular disease) (HCC)   . Cigarette nicotine dependence without complication   . Cutaneous abscess, unspecified site   . Gastroesophageal reflux disease, esophagitis presence not specified   . Hypothyroidism, unspecified type   . Screening for prostate cancer      -pt counseled to apply warm compresses to back/abscess.  rx septra. Remove packing 24-48 hours. Tylenol if needed. -check labs -follow up in 6 weeks to recheck the bp.  No changes in medication today.  RTO sooner prn

## 2016-10-03 NOTE — Patient Instructions (Signed)

## 2016-10-04 LAB — BASIC METABOLIC PANEL
BUN: 10 mg/dL (ref 7–25)
CHLORIDE: 105 mmol/L (ref 98–110)
CO2: 27 mmol/L (ref 20–31)
Calcium: 9.2 mg/dL (ref 8.6–10.3)
Creat: 1.17 mg/dL (ref 0.70–1.33)
Glucose, Bld: 106 mg/dL — ABNORMAL HIGH (ref 65–99)
POTASSIUM: 4.3 mmol/L (ref 3.5–5.3)
SODIUM: 138 mmol/L (ref 135–146)

## 2016-10-04 LAB — PSA: PSA: 1.3 ng/mL (ref <=4.0)

## 2016-10-04 LAB — TSH: TSH: 2.69 m[IU]/L (ref 0.40–4.50)

## 2016-11-03 ENCOUNTER — Other Ambulatory Visit: Payer: Self-pay | Admitting: Physician Assistant

## 2016-11-14 ENCOUNTER — Ambulatory Visit: Payer: Self-pay | Admitting: Physician Assistant

## 2016-11-30 ENCOUNTER — Ambulatory Visit: Payer: Self-pay | Admitting: Physician Assistant

## 2016-11-30 ENCOUNTER — Encounter: Payer: Self-pay | Admitting: Physician Assistant

## 2016-11-30 VITALS — BP 140/78 | HR 70 | Temp 98.1°F | Wt 218.5 lb

## 2016-11-30 DIAGNOSIS — K219 Gastro-esophageal reflux disease without esophagitis: Secondary | ICD-10-CM

## 2016-11-30 DIAGNOSIS — E039 Hypothyroidism, unspecified: Secondary | ICD-10-CM

## 2016-11-30 DIAGNOSIS — I10 Essential (primary) hypertension: Secondary | ICD-10-CM

## 2016-11-30 DIAGNOSIS — F17210 Nicotine dependence, cigarettes, uncomplicated: Secondary | ICD-10-CM

## 2016-11-30 MED ORDER — RANITIDINE HCL 300 MG PO TABS: 300.0000 mg | ORAL_TABLET | Freq: Every day | ORAL | 4 refills | Status: DC

## 2016-11-30 MED ORDER — LISINOPRIL 10 MG PO TABS: 10.0000 mg | ORAL_TABLET | Freq: Every day | ORAL | 3 refills | Status: DC

## 2016-11-30 MED ORDER — LEVOTHYROXINE SODIUM 100 MCG PO TABS: 100.0000 ug | ORAL_TABLET | Freq: Every day | ORAL | 4 refills | Status: DC

## 2016-11-30 NOTE — Progress Notes (Signed)
BP 140/78 (BP Location: Left Arm, Patient Position: Sitting, Cuff Size: Large)   Pulse 70   Temp 98.1 F (36.7 C)   Wt 218 lb 8 oz (99.1 kg)   SpO2 97%   BMI 33.22 kg/m    Subjective:    Patient ID: David Mosley, male    DOB: 03/15/1956, 60 y.o.   MRN: [id number]  HPI: David Mosley is a 60 y.o. male presenting on 11/30/2016 for Follow-up   HPI  Pt took his last lisinopril yesterday so he has not had his dose yet today.   He is feeling well.  Relevant past medical, surgical, family and social history reviewed and updated as indicated. Interim medical history since our last visit reviewed. Allergies and medications reviewed and updated.   Current Outpatient Prescriptions:  .  levothyroxine (SYNTHROID, LEVOTHROID) 100 MCG tablet, TAKE ONE TABLET BY MOUTH ONCE DAILY, Disp: 30 tablet, Rfl: 4 .  ranitidine (ZANTAC) 300 MG tablet, Take 1 tablet (300 mg total) by mouth at bedtime., Disp: 30 tablet, Rfl: 4 .  lisinopril (PRINIVIL,ZESTRIL) 10 MG tablet, TAKE ONE TABLET BY MOUTH ONCE DAILY (Patient not taking: Reported on 11/30/2016), Disp: 30 tablet, Rfl: 3   Review of Systems  Constitutional: Negative for appetite change, chills, diaphoresis, fatigue, fever and unexpected weight change.  HENT: Negative for congestion, dental problem, drooling, ear pain, facial swelling, hearing loss, mouth sores, sneezing, sore throat, trouble swallowing and voice change.   Eyes: Positive for visual disturbance. Negative for pain, discharge, redness and itching.  Respiratory: Negative for cough, choking, shortness of breath and wheezing.   Cardiovascular: Negative for chest pain, palpitations and leg swelling.  Gastrointestinal: Negative for abdominal pain, blood in stool, constipation, diarrhea and vomiting.  Endocrine: Negative for cold intolerance, heat intolerance and polydipsia.  Genitourinary: Negative for decreased urine volume, dysuria and hematuria.  Musculoskeletal: Negative for arthralgias,  back pain and gait problem.  Skin: Negative for rash.  Allergic/Immunologic: Negative for environmental allergies.  Neurological: Negative for seizures, syncope, light-headedness and headaches.  Hematological: Negative for adenopathy.  Psychiatric/Behavioral: Negative for agitation, dysphoric mood and suicidal ideas. The patient is not nervous/anxious.     Per HPI unless specifically indicated above     Objective:    BP 140/78 (BP Location: Left Arm, Patient Position: Sitting, Cuff Size: Large)   Pulse 70   Temp 98.1 F (36.7 C)   Wt 218 lb 8 oz (99.1 kg)   SpO2 97%   BMI 33.22 kg/m   Wt Readings from Last 3 Encounters:  11/30/16 218 lb 8 oz (99.1 kg)  10/03/16 218 lb 9.6 oz (99.2 kg)  06/28/16 220 lb (99.8 kg)    Physical Exam  Constitutional: He is oriented to person, place, and time. He appears well-developed and well-nourished.  HENT:  Head: Normocephalic and atraumatic.  Neck: Neck supple.  Cardiovascular: Normal rate and regular rhythm.   Pulmonary/Chest: Effort normal and breath sounds normal. He has no wheezes.  Abdominal: Soft. Bowel sounds are normal. There is no hepatosplenomegaly. There is no tenderness.  Musculoskeletal: He exhibits no edema.  Lymphadenopathy:    He has no cervical adenopathy.  Neurological: He is alert and oriented to person, place, and time.  Skin: Skin is warm and dry.  Psychiatric: He has a normal mood and affect. His behavior is normal.  Vitals reviewed.       Assessment & Plan:   Encounter Diagnoses  Name Primary?  . Essential hypertension, benign Yes  . Hypothyroidism, unspecified  type   . Gastroesophageal reflux disease, esophagitis presence not specified   . Cigarette nicotine dependence without complication     -Pt counseled to Go to eye dr to get vision/glasses checked -pt to continue current medications. Refills given -F/u 3 months with labs before appt

## 2017-02-20 ENCOUNTER — Other Ambulatory Visit: Payer: Self-pay

## 2017-02-20 DIAGNOSIS — I10 Essential (primary) hypertension: Secondary | ICD-10-CM

## 2017-02-20 DIAGNOSIS — E039 Hypothyroidism, unspecified: Secondary | ICD-10-CM

## 2017-02-23 LAB — BASIC METABOLIC PANEL
BUN: 13 mg/dL (ref 7–25)
CHLORIDE: 103 mmol/L (ref 98–110)
CO2: 26 mmol/L (ref 20–31)
Calcium: 8.9 mg/dL (ref 8.6–10.3)
Creat: 1.4 mg/dL — ABNORMAL HIGH (ref 0.70–1.33)
Glucose, Bld: 94 mg/dL (ref 65–99)
POTASSIUM: 4.6 mmol/L (ref 3.5–5.3)
SODIUM: 135 mmol/L (ref 135–146)

## 2017-02-23 LAB — TSH: TSH: 3.67 m[IU]/L (ref 0.40–4.50)

## 2017-03-01 ENCOUNTER — Ambulatory Visit: Payer: Self-pay | Admitting: Physician Assistant

## 2017-03-01 ENCOUNTER — Encounter: Payer: Self-pay | Admitting: Physician Assistant

## 2017-03-01 VITALS — BP 164/82 | HR 71 | Temp 98.1°F | Wt 227.2 lb

## 2017-03-01 DIAGNOSIS — K219 Gastro-esophageal reflux disease without esophagitis: Secondary | ICD-10-CM

## 2017-03-01 DIAGNOSIS — E039 Hypothyroidism, unspecified: Secondary | ICD-10-CM

## 2017-03-01 DIAGNOSIS — R002 Palpitations: Secondary | ICD-10-CM

## 2017-03-01 DIAGNOSIS — R609 Edema, unspecified: Secondary | ICD-10-CM

## 2017-03-01 DIAGNOSIS — F17210 Nicotine dependence, cigarettes, uncomplicated: Secondary | ICD-10-CM

## 2017-03-01 DIAGNOSIS — I10 Essential (primary) hypertension: Secondary | ICD-10-CM

## 2017-03-01 MED ORDER — METOPROLOL TARTRATE 25 MG PO TABS: 12.5000 mg | ORAL_TABLET | Freq: Two times a day (BID) | ORAL | 1 refills | Status: DC

## 2017-03-01 NOTE — Progress Notes (Signed)
BP (!) 164/82 (BP Location: Left Arm, Patient Position: Sitting, Cuff Size: Normal)   Pulse 71   Temp 98.1 F (36.7 C)   Wt 227 lb 4 oz (103.1 kg)   SpO2 96%   BMI 34.55 kg/m    Subjective:    Patient ID: David Mosley, male    DOB: 06/22/1956, 61 y.o.   MRN: [id number]  HPI: David NewcomerDonnie Mosley is a 61 y.o. male presenting on 03/01/2017 for Hypertension and Thyroid Problem   HPI   Pt c/o palpitations.  His Ex-wife who is with him today states his  pulse is 140 sometimes when he is complaining of palpitations.  Pt states he got adenosine while incarcerated.  ("they gave me a shot that killed me").  Suspect history of SVT although records unavailable  Pt drinks a lot of caffeine- coffee and tea.   Pt had echo last year for edema  Relevant past medical, surgical, family and social history reviewed and updated as indicated. Interim medical history since our last visit reviewed. Allergies and medications reviewed and updated.   Current Outpatient Prescriptions:  .  Fexofenadine HCl (ALLEGRA ALLERGY PO), Take 1 tablet by mouth as needed. , Disp: , Rfl:  .  levothyroxine (SYNTHROID, LEVOTHROID) 100 MCG tablet, Take 1 tablet (100 mcg total) by mouth daily., Disp: 30 tablet, Rfl: 4 .  lisinopril (PRINIVIL,ZESTRIL) 10 MG tablet, Take 1 tablet (10 mg total) by mouth daily., Disp: 30 tablet, Rfl: 3 .  ranitidine (ZANTAC) 300 MG tablet, Take 1 tablet (300 mg total) by mouth at bedtime., Disp: 30 tablet, Rfl: 4  Review of Systems  Constitutional: Negative for appetite change, chills, diaphoresis, fatigue, fever and unexpected weight change.  HENT: Positive for hearing loss. Negative for congestion, dental problem, drooling, ear pain, facial swelling, mouth sores, sneezing, sore throat, trouble swallowing and voice change.   Eyes: Positive for visual disturbance. Negative for pain, discharge, redness and itching.  Respiratory: Negative for cough, choking, shortness of breath and wheezing.    Cardiovascular: Negative for chest pain, palpitations and leg swelling.  Gastrointestinal: Negative for abdominal pain, blood in stool, constipation, diarrhea and vomiting.  Endocrine: Negative for cold intolerance, heat intolerance and polydipsia.  Genitourinary: Negative for decreased urine volume, dysuria and hematuria.  Musculoskeletal: Positive for arthralgias. Negative for back pain and gait problem.  Skin: Negative for rash.  Allergic/Immunologic: Negative for environmental allergies.  Neurological: Negative for seizures, syncope, light-headedness and headaches.  Hematological: Negative for adenopathy.  Psychiatric/Behavioral: Negative for agitation, dysphoric mood and suicidal ideas. The patient is not nervous/anxious.     Per HPI unless specifically indicated above     Objective:    BP (!) 164/82 (BP Location: Left Arm, Patient Position: Sitting, Cuff Size: Normal)   Pulse 71   Temp 98.1 F (36.7 C)   Wt 227 lb 4 oz (103.1 kg)   SpO2 96%   BMI 34.55 kg/m   Wt Readings from Last 3 Encounters:  03/01/17 227 lb 4 oz (103.1 kg)  11/30/16 218 lb 8 oz (99.1 kg)  10/03/16 218 lb 9.6 oz (99.2 kg)    Physical Exam  Constitutional: He is oriented to person, place, and time. He appears well-developed and well-nourished.  HENT:  Head: Normocephalic and atraumatic.  Neck: Neck supple.  Cardiovascular: Normal rate and regular rhythm.   Pulmonary/Chest: Effort normal and breath sounds normal. He has no wheezes.  Abdominal: Soft. Bowel sounds are normal. There is no hepatosplenomegaly. There is no tenderness.  Musculoskeletal:  He exhibits edema (LLE edema).  Lymphadenopathy:    He has no cervical adenopathy.  Neurological: He is alert and oriented to person, place, and time.  Skin: Skin is warm and dry.  Psychiatric: He has a normal mood and affect. His behavior is normal.  Vitals reviewed.     EKG-  Sinus rhythm. 1st deg AV block. No previous for comparison  Results for  orders placed or performed in visit on 02/20/17  TSH  Result Value Ref Range   TSH 3.67 0.40 - 4.50 mIU/L  Basic Metabolic Panel (BMET)  Result Value Ref Range   Sodium 135 135 - 146 mmol/L   Potassium 4.6 3.5 - 5.3 mmol/L   Chloride 103 98 - 110 mmol/L   CO2 26 20 - 31 mmol/L   Glucose, Bld 94 65 - 99 mg/dL   BUN 13 7 - 25 mg/dL   Creat 1.61 (H) 0.96 - 1.33 mg/dL   Calcium 8.9 8.6 - 04.5 mg/dL      Assessment & Plan:   Encounter Diagnoses  Name Primary?  . Essential hypertension, benign Yes  . Palpitations   . Hypothyroidism, unspecified type   . Cigarette nicotine dependence without complication   . Edema, unspecified type   . Gastroesophageal reflux disease, esophagitis presence not specified     -reviewed labs with pt -pt counseled to Avoid caffeine -pt counseled to Increase water -Refer to cardiology -Add metoprolol - gave low-dose as pt worried about medication making him feel bad -pt to continue current medications -pt was given [facility] discount application -pt to follow up 1 month.  RTO sooner prn worsening or new symptoms -pt was counseled to go to ER if feels bad with palpitations and fast pulse rate

## 2017-03-13 ENCOUNTER — Encounter: Payer: Self-pay | Admitting: Cardiovascular Disease

## 2017-03-28 ENCOUNTER — Encounter: Payer: Self-pay | Admitting: Cardiovascular Disease

## 2017-04-05 ENCOUNTER — Ambulatory Visit: Payer: Self-pay | Admitting: Physician Assistant

## 2017-04-19 ENCOUNTER — Encounter: Payer: Self-pay | Admitting: Physician Assistant

## 2017-07-11 ENCOUNTER — Encounter: Payer: Self-pay | Admitting: Physician Assistant

## 2017-07-11 ENCOUNTER — Other Ambulatory Visit (HOSPITAL_COMMUNITY)
Admission: RE | Admit: 2017-07-11 | Discharge: 2017-07-11 | Disposition: A | Discharge status: Home / Self Care | Payer: Self-pay | Source: Ambulatory Visit | Attending: Physician Assistant | Admitting: Physician Assistant

## 2017-07-11 ENCOUNTER — Ambulatory Visit: Payer: Self-pay | Admitting: Physician Assistant

## 2017-07-11 VITALS — BP 148/78 | HR 79 | Temp 98.4°F | Ht 68.0 in | Wt 218.5 lb

## 2017-07-11 DIAGNOSIS — L819 Disorder of pigmentation, unspecified: Secondary | ICD-10-CM

## 2017-07-11 DIAGNOSIS — I10 Essential (primary) hypertension: Secondary | ICD-10-CM

## 2017-07-11 DIAGNOSIS — F17210 Nicotine dependence, cigarettes, uncomplicated: Secondary | ICD-10-CM

## 2017-07-11 DIAGNOSIS — E039 Hypothyroidism, unspecified: Secondary | ICD-10-CM

## 2017-07-11 LAB — BASIC METABOLIC PANEL
ANION GAP: 7 (ref 5–15)
BUN: 15 mg/dL (ref 6–20)
CHLORIDE: 102 mmol/L (ref 101–111)
CO2: 24 mmol/L (ref 22–32)
Calcium: 9.2 mg/dL (ref 8.9–10.3)
Creatinine, Ser: 1.34 mg/dL — ABNORMAL HIGH (ref 0.61–1.24)
GFR calc Af Amer: >60 mL/min (ref >60)
GFR, EST NON AFRICAN AMERICAN: 56 mL/min — AB (ref >60)
GLUCOSE: 107 mg/dL — AB (ref 65–99)
POTASSIUM: 3.8 mmol/L (ref 3.5–5.1)
Sodium: 133 mmol/L — ABNORMAL LOW (ref 135–145)

## 2017-07-11 MED ORDER — LISINOPRIL 20 MG PO TABS: 20.0000 mg | ORAL_TABLET | Freq: Every day | ORAL | 1 refills | Status: DC

## 2017-07-11 NOTE — Progress Notes (Signed)
BP (!) 148/78 (BP Location: Left Arm, Patient Position: Sitting, Cuff Size: Large)   Pulse 79   Temp 98.4 F (36.9 C) (Other (Comment))   Ht 5\' 8"  (1.727 m)   Wt 218 lb 8 oz (99.1 kg)   SpO2 97%   BMI 33.22 kg/m    Subjective:    Patient ID: David Mosley, male    DOB: 04/20/1956, 61 y.o.   MRN: [id number]  HPI: David NewcomerDonnie Handyside is a 61 y.o. male presenting on 07/11/2017 for Follow-up   HPI  Pt says the he only has the palpitations every once in a while. Maybe once/month.    Pt was referred to cardiology for palpitations/suspected SVT in march and pt wouldn't return their call/attempts to contact him to scheduled appt.  Pt says he didn't want to go to the cardiologist.  Pt didn't follow up with me in April either.   Pt still smoking.  He took his metoprolol twice only.  He says it made him sob so he didn't take it again.    Relevant past medical, surgical, family and social history reviewed and updated as indicated. Interim medical history since our last visit reviewed. Allergies and medications reviewed and updated.   Current Outpatient Prescriptions:  .  Fexofenadine HCl (ALLEGRA ALLERGY PO), Take 1 tablet by mouth as needed. , Disp: , Rfl:  .  levothyroxine (SYNTHROID, LEVOTHROID) 100 MCG tablet, Take 1 tablet (100 mcg total) by mouth daily., Disp: 30 tablet, Rfl: 4 .  lisinopril (PRINIVIL,ZESTRIL) 10 MG tablet, Take 1 tablet (10 mg total) by mouth daily., Disp: 30 tablet, Rfl: 3 .  ranitidine (ZANTAC) 300 MG tablet, Take 1 tablet (300 mg total) by mouth at bedtime., Disp: 30 tablet, Rfl: 4 .  metoprolol tartrate (LOPRESSOR) 25 MG tablet, Take 0.5 tablets (12.5 mg total) by mouth 2 (two) times daily. (Patient not taking: Reported on 07/11/2017), Disp: 30 tablet, Rfl: 1   Review of Systems  Constitutional: Negative for appetite change, chills, diaphoresis, fatigue, fever and unexpected weight change.  HENT: Negative for congestion, dental problem, drooling, ear pain, facial  swelling, hearing loss, mouth sores, sneezing, sore throat, trouble swallowing and voice change.   Eyes: Positive for visual disturbance. Negative for pain, discharge, redness and itching.  Respiratory: Negative for cough, choking, shortness of breath and wheezing.   Cardiovascular: Positive for leg swelling. Negative for chest pain and palpitations.  Gastrointestinal: Negative for abdominal pain, blood in stool, constipation, diarrhea and vomiting.  Endocrine: Negative for cold intolerance, heat intolerance and polydipsia.  Genitourinary: Negative for decreased urine volume, dysuria and hematuria.  Musculoskeletal: Negative for arthralgias, back pain and gait problem.  Skin: Negative for rash.  Allergic/Immunologic: Negative for environmental allergies.  Neurological: Negative for seizures, syncope, light-headedness and headaches.  Hematological: Negative for adenopathy.  Psychiatric/Behavioral: Negative for agitation, dysphoric mood and suicidal ideas. The patient is not nervous/anxious.     Per HPI unless specifically indicated above     Objective:    BP (!) 148/78 (BP Location: Left Arm, Patient Position: Sitting, Cuff Size: Large)   Pulse 79   Temp 98.4 F (36.9 C) (Other (Comment))   Ht 5\' 8"  (1.727 m)   Wt 218 lb 8 oz (99.1 kg)   SpO2 97%   BMI 33.22 kg/m   Wt Readings from Last 3 Encounters:  07/11/17 218 lb 8 oz (99.1 kg)  03/01/17 227 lb 4 oz (103.1 kg)  11/30/16 218 lb 8 oz (99.1 kg)    Physical  Exam  Constitutional: He is oriented to person, place, and time. He appears well-developed and well-nourished.  HENT:  Head: Normocephalic and atraumatic.  Neck: Neck supple.  Cardiovascular: Normal rate and regular rhythm.   Pulmonary/Chest: Effort normal and breath sounds normal. He has no wheezes.  Abdominal: Soft. Bowel sounds are normal. There is no hepatosplenomegaly. There is no tenderness.  Musculoskeletal: He exhibits no edema.  Lymphadenopathy:    He has no  cervical adenopathy.  Neurological: He is alert and oriented to person, place, and time.  Skin: Skin is warm and dry. Lesion noted.  Larger than 1 cm irregular skin lesion over L tragus  Psychiatric: He has a normal mood and affect. His behavior is normal.  Vitals reviewed.       Assessment & Plan:   Encounter Diagnoses  Name Primary?  . Essential hypertension, benign Yes  . Hypothyroidism, unspecified type   . Cigarette nicotine dependence without complication   . Atypical pigmented skin lesion      -will Refer to dermatology for L ear lesion -will Check bmp today.  Will call pt with results -increase lisinopril for bp -pt to follow up in one month to recheck bp.  Pt counseled to RTO sooner for any more abnormal heart beating

## 2017-08-11 ENCOUNTER — Other Ambulatory Visit: Payer: Self-pay | Admitting: Physician Assistant

## 2017-08-15 ENCOUNTER — Ambulatory Visit: Payer: Self-pay | Admitting: Physician Assistant

## 2017-08-22 ENCOUNTER — Encounter: Payer: Self-pay | Admitting: Physician Assistant

## 2017-12-22 ENCOUNTER — Other Ambulatory Visit: Payer: Self-pay | Admitting: Physician Assistant

## 2018-01-29 ENCOUNTER — Encounter: Payer: Self-pay | Admitting: Physician Assistant

## 2018-01-29 ENCOUNTER — Other Ambulatory Visit (HOSPITAL_COMMUNITY)
Admission: RE | Admit: 2018-01-29 | Discharge: 2018-01-29 | Disposition: A | Discharge status: Home / Self Care | Payer: Self-pay | Source: Ambulatory Visit | Attending: Physician Assistant | Admitting: Physician Assistant

## 2018-01-29 ENCOUNTER — Ambulatory Visit: Payer: Self-pay | Admitting: Physician Assistant

## 2018-01-29 VITALS — BP 175/99 | HR 79 | Temp 97.7°F | Ht 68.0 in | Wt 216.0 lb

## 2018-01-29 DIAGNOSIS — F17210 Nicotine dependence, cigarettes, uncomplicated: Secondary | ICD-10-CM

## 2018-01-29 DIAGNOSIS — I10 Essential (primary) hypertension: Secondary | ICD-10-CM

## 2018-01-29 DIAGNOSIS — Z125 Encounter for screening for malignant neoplasm of prostate: Secondary | ICD-10-CM

## 2018-01-29 DIAGNOSIS — L309 Dermatitis, unspecified: Secondary | ICD-10-CM

## 2018-01-29 DIAGNOSIS — E039 Hypothyroidism, unspecified: Secondary | ICD-10-CM

## 2018-01-29 DIAGNOSIS — I8393 Asymptomatic varicose veins of bilateral lower extremities: Secondary | ICD-10-CM

## 2018-01-29 LAB — BASIC METABOLIC PANEL
ANION GAP: 10 (ref 5–15)
BUN: 13 mg/dL (ref 6–20)
CALCIUM: 9.3 mg/dL (ref 8.9–10.3)
CO2: 24 mmol/L (ref 22–32)
Chloride: 101 mmol/L (ref 101–111)
Creatinine, Ser: 1.34 mg/dL — ABNORMAL HIGH (ref 0.61–1.24)
GFR, EST NON AFRICAN AMERICAN: 56 mL/min — AB (ref >60)
Glucose, Bld: 91 mg/dL (ref 65–99)
POTASSIUM: 3.8 mmol/L (ref 3.5–5.1)
Sodium: 135 mmol/L (ref 135–145)

## 2018-01-29 LAB — TSH: TSH: 5.965 u[IU]/mL — AB (ref 0.350–4.500)

## 2018-01-29 MED ORDER — LISINOPRIL 20 MG PO TABS: 20.0000 mg | ORAL_TABLET | Freq: Every day | ORAL | 1 refills | Status: DC

## 2018-01-29 MED ORDER — TRIAMCINOLONE ACETONIDE 0.1 % EX OINT: 1.0000 "application " | TOPICAL_OINTMENT | Freq: Two times a day (BID) | CUTANEOUS | 0 refills | Status: DC

## 2018-01-29 MED ORDER — LEVOTHYROXINE SODIUM 100 MCG PO TABS: 100.0000 ug | ORAL_TABLET | Freq: Every day | ORAL | 1 refills | Status: DC

## 2018-01-29 NOTE — Progress Notes (Signed)
BP (!) 175/99 (BP Location: Right Arm, Patient Position: Sitting, Cuff Size: Normal)   Pulse 79   Temp 97.7 F (36.5 C)   Ht 5\' 8"  (1.727 m)   Wt 216 lb (98 kg)   SpO2 97%   BMI 32.84 kg/m    Subjective:    Patient ID: David Mosley, male    DOB: September 21, 1956, 62 y.o.   MRN: [id number]  HPI: David Mosley is a 62 y.o. male presenting on 01/29/2018 for Follow-up   HPI   Pt hasn't been in to office since july.  He was due for follow up august 2018.   Relevant past medical, surgical, family and social history reviewed and updated as indicated. Interim medical history since our last visit reviewed. Allergies and medications reviewed and updated.  CURRENT MEDS: Allegra Zantac  Review of Systems  Constitutional: Negative for appetite change, chills, diaphoresis, fatigue, fever and unexpected weight change.  HENT: Negative for congestion, dental problem, drooling, ear pain, facial swelling, hearing loss, mouth sores, sneezing, sore throat, trouble swallowing and voice change.   Eyes: Positive for visual disturbance. Negative for pain, discharge, redness and itching.  Respiratory: Negative for cough, choking, shortness of breath and wheezing.   Cardiovascular: Negative for chest pain, palpitations and leg swelling.  Gastrointestinal: Negative for abdominal pain, blood in stool, constipation, diarrhea and vomiting.  Endocrine: Negative for cold intolerance, heat intolerance and polydipsia.  Genitourinary: Negative for decreased urine volume, dysuria and hematuria.  Musculoskeletal: Negative for arthralgias, back pain and gait problem.  Skin: Negative for rash.  Allergic/Immunologic: Negative for environmental allergies.  Neurological: Negative for seizures, syncope, light-headedness and headaches.  Hematological: Negative for adenopathy.  Psychiatric/Behavioral: Negative for agitation, dysphoric mood and suicidal ideas. The patient is not nervous/anxious.     Per HPI unless  specifically indicated above     Objective:    BP (!) 175/99 (BP Location: Right Arm, Patient Position: Sitting, Cuff Size: Normal)   Pulse 79   Temp 97.7 F (36.5 C)   Ht 5\' 8"  (1.727 m)   Wt 216 lb (98 kg)   SpO2 97%   BMI 32.84 kg/m   Wt Readings from Last 3 Encounters:  01/29/18 216 lb (98 kg)  07/11/17 218 lb 8 oz (99.1 kg)  03/01/17 227 lb 4 oz (103.1 kg)    Physical Exam  Constitutional: He is oriented to person, place, and time. He appears well-developed and well-nourished.  HENT:  Head: Normocephalic and atraumatic.  Neck: Neck supple.  Cardiovascular: Normal rate and regular rhythm.  Pulses:      Dorsalis pedis pulses are 2+ on the right side, and 2+ on the left side.  Varicosities BLE  Pulmonary/Chest: Effort normal and breath sounds normal. He has no wheezes.  Abdominal: Soft. Bowel sounds are normal. There is no hepatosplenomegaly. There is no tenderness.  Musculoskeletal: He exhibits no edema.       Feet:  Skin L medial ankle is dry, cracking, irritated.  No discrete lesion.  The area is mildly swelled.  No erythema.   Lymphadenopathy:    He has no cervical adenopathy.  Neurological: He is alert and oriented to person, place, and time.  Skin: Skin is warm and dry.  Psychiatric: He has a normal mood and affect. His behavior is normal.  Vitals reviewed.       Assessment & Plan:    Encounter Diagnoses  Name Primary?  . Essential hypertension Yes  . Hypothyroidism, unspecified type   . Screening  for prostate cancer   . Cigarette nicotine dependence without complication   . Dermatitis   . Varicose veins of both lower extremities, unspecified whether complicated     -Check bmp, tsh, psa -rx lisinopril, levothyroxine -rx TAC ointment for L ankle and pt counseled to elevate the foot when seated -counseled smoking cessation -pt to follow up 4 weeks. RTO sooner prn

## 2018-01-30 LAB — PSA: Prostatic Specific Antigen: 2.53 ng/mL (ref 0.00–4.00)

## 2018-02-26 ENCOUNTER — Encounter: Payer: Self-pay | Admitting: Physician Assistant

## 2018-02-26 ENCOUNTER — Ambulatory Visit: Payer: Self-pay | Admitting: Physician Assistant

## 2018-02-26 VITALS — BP 176/86 | HR 87 | Temp 97.9°F | Ht 68.0 in | Wt 215.0 lb

## 2018-02-26 DIAGNOSIS — N189 Chronic kidney disease, unspecified: Secondary | ICD-10-CM

## 2018-02-26 DIAGNOSIS — I10 Essential (primary) hypertension: Secondary | ICD-10-CM

## 2018-02-26 DIAGNOSIS — F17210 Nicotine dependence, cigarettes, uncomplicated: Secondary | ICD-10-CM

## 2018-02-26 DIAGNOSIS — E039 Hypothyroidism, unspecified: Secondary | ICD-10-CM

## 2018-02-26 MED ORDER — AMLODIPINE BESYLATE 5 MG PO TABS: 5.0000 mg | ORAL_TABLET | Freq: Every day | ORAL | 1 refills | Status: DC

## 2018-02-26 NOTE — Progress Notes (Signed)
BP (!) 176/86 (BP Location: Right Arm, Patient Position: Sitting, Cuff Size: Normal)   Pulse 87   Temp 97.9 F (36.6 C)   Ht 5\' 8"  (1.727 m)   Wt 215 lb (97.5 kg)   SpO2 95%   BMI 32.69 kg/m    Subjective:    Patient ID: David Mosley, male    DOB: 1956-09-18, 62 y.o.   MRN: [id number]  HPI: David Mosley is a 62 y.o. male presenting on 02/26/2018 for Hypertension (pt states he forgot to take his bp med this morning.) and Dermatitis   HPI   He has been taking his meds, just not today  he States ankle improved  Relevant past medical, surgical, family and social history reviewed and updated as indicated. Interim medical history since our last visit reviewed. Allergies and medications reviewed and updated.   Current Outpatient Medications:  .  Fexofenadine HCl (ALLEGRA ALLERGY PO), Take 1 tablet by mouth as needed. , Disp: , Rfl:  .  levothyroxine (SYNTHROID, LEVOTHROID) 100 MCG tablet, Take 1 tablet (100 mcg total) by mouth daily., Disp: 30 tablet, Rfl: 1 .  lisinopril (PRINIVIL,ZESTRIL) 20 MG tablet, Take 1 tablet (20 mg total) by mouth daily., Disp: 30 tablet, Rfl: 1 .  triamcinolone ointment (KENALOG) 0.1 %, Apply 1 application topically 2 (two) times daily., Disp: 30 g, Rfl: 0 .  ranitidine (ZANTAC) 300 MG tablet, Take 1 tablet (300 mg total) by mouth at bedtime. (Patient not taking: Reported on 02/26/2018), Disp: 30 tablet, Rfl: 4   Review of Systems  Constitutional: Negative for appetite change, chills, diaphoresis, fatigue, fever and unexpected weight change.  HENT: Negative for congestion, dental problem, drooling, ear pain, facial swelling, hearing loss, mouth sores, sneezing, sore throat, trouble swallowing and voice change.   Eyes: Positive for visual disturbance. Negative for pain, discharge, redness and itching.  Respiratory: Negative for cough, choking, shortness of breath and wheezing.   Cardiovascular: Positive for leg swelling. Negative for chest pain and  palpitations.  Gastrointestinal: Negative for abdominal pain, blood in stool, constipation, diarrhea and vomiting.  Endocrine: Negative for cold intolerance, heat intolerance and polydipsia.  Genitourinary: Negative for decreased urine volume, dysuria and hematuria.  Musculoskeletal: Negative for arthralgias, back pain and gait problem.  Skin: Negative for rash.  Allergic/Immunologic: Negative for environmental allergies.  Neurological: Negative for seizures, syncope, light-headedness and headaches.  Hematological: Negative for adenopathy.  Psychiatric/Behavioral: Negative for agitation, dysphoric mood and suicidal ideas. The patient is not nervous/anxious.     Per HPI unless specifically indicated above     Objective:    BP (!) 176/86 (BP Location: Right Arm, Patient Position: Sitting, Cuff Size: Normal)   Pulse 87   Temp 97.9 F (36.6 C)   Ht 5\' 8"  (1.727 m)   Wt 215 lb (97.5 kg)   SpO2 95%   BMI 32.69 kg/m   Wt Readings from Last 3 Encounters:  02/26/18 215 lb (97.5 kg)  01/29/18 216 lb (98 kg)  07/11/17 218 lb 8 oz (99.1 kg)    Physical Exam  Constitutional: He is oriented to person, place, and time. He appears well-developed and well-nourished.  HENT:  Head: Normocephalic and atraumatic.  Neck: Neck supple.  Cardiovascular: Normal rate and regular rhythm.  Pulmonary/Chest: Effort normal and breath sounds normal. He has no wheezes.  Abdominal: Soft. Bowel sounds are normal. There is no hepatosplenomegaly. There is no tenderness.  Musculoskeletal: He exhibits no edema.  Lymphadenopathy:    He has no cervical adenopathy.  Neurological: He is alert and oriented to person, place, and time.  Skin: Skin is warm and dry.  Skin L medial ankle still hyperpigmented but no cracking.  Appears smooth and like it is improved  Psychiatric: He has a normal mood and affect. His behavior is normal.  Vitals reviewed.   Results for orders placed or performed during the hospital  encounter of 01/29/18  PSA  Result Value Ref Range   Prostatic Specific Antigen 2.53 0.00 - 4.00 ng/mL  Basic metabolic panel  Result Value Ref Range   Sodium 135 135 - 145 mmol/L   Potassium 3.8 3.5 - 5.1 mmol/L   Chloride 101 101 - 111 mmol/L   CO2 24 22 - 32 mmol/L   Glucose, Bld 91 65 - 99 mg/dL   BUN 13 6 - 20 mg/dL   Creatinine, Ser 1.611.34 (H) 0.61 - 1.24 mg/dL   Calcium 9.3 8.9 - 09.610.3 mg/dL   GFR calc non Af Amer 56 (L) >60 mL/min   GFR calc Af Amer >60 >60 mL/min   Anion gap 10 5 - 15  TSH  Result Value Ref Range   TSH 5.965 (H) 0.350 - 4.500 uIU/mL      Assessment & Plan:    Encounter Diagnoses  Name Primary?  . Essential hypertension Yes  . Hypothyroidism, unspecified type   . Chronic kidney disease, unspecified CKD stage   . Cigarette nicotine dependence without complication     -Reviewed labs with pt -Add amlodipine -Continue lisinopril -counseled pt on need to control BP in light of impaired renal function -follow up 4 weeks to recheck bp.  RTO sooner prn

## 2018-03-26 ENCOUNTER — Other Ambulatory Visit: Payer: Self-pay

## 2018-03-26 ENCOUNTER — Encounter (HOSPITAL_COMMUNITY): Payer: Self-pay | Admitting: *Deleted

## 2018-03-26 ENCOUNTER — Emergency Department (HOSPITAL_COMMUNITY): Payer: Self-pay

## 2018-03-26 ENCOUNTER — Encounter: Payer: Self-pay | Admitting: Physician Assistant

## 2018-03-26 ENCOUNTER — Ambulatory Visit: Payer: Self-pay | Admitting: Physician Assistant

## 2018-03-26 ENCOUNTER — Inpatient Hospital Stay (HOSPITAL_COMMUNITY)
Admission: EM | Admit: 2018-03-26 | Discharge: 2018-03-27 | DRG: 310 | Disposition: A | Discharge status: Home / Self Care | Payer: Self-pay | Attending: Internal Medicine | Admitting: Internal Medicine

## 2018-03-26 VITALS — BP 131/100 | HR 141 | Temp 98.1°F | Wt 219.8 lb

## 2018-03-26 DIAGNOSIS — I1310 Hypertensive heart and chronic kidney disease without heart failure, with stage 1 through stage 4 chronic kidney disease, or unspecified chronic kidney disease: Secondary | ICD-10-CM | POA: Diagnosis present

## 2018-03-26 DIAGNOSIS — Z79890 Hormone replacement therapy (postmenopausal): Secondary | ICD-10-CM

## 2018-03-26 DIAGNOSIS — Z79899 Other long term (current) drug therapy: Secondary | ICD-10-CM

## 2018-03-26 DIAGNOSIS — R000 Tachycardia, unspecified: Secondary | ICD-10-CM

## 2018-03-26 DIAGNOSIS — N189 Chronic kidney disease, unspecified: Secondary | ICD-10-CM | POA: Diagnosis present

## 2018-03-26 DIAGNOSIS — I10 Essential (primary) hypertension: Secondary | ICD-10-CM

## 2018-03-26 DIAGNOSIS — F17210 Nicotine dependence, cigarettes, uncomplicated: Secondary | ICD-10-CM | POA: Diagnosis present

## 2018-03-26 DIAGNOSIS — Z8042 Family history of malignant neoplasm of prostate: Secondary | ICD-10-CM

## 2018-03-26 DIAGNOSIS — Z9114 Patient's other noncompliance with medication regimen: Secondary | ICD-10-CM

## 2018-03-26 DIAGNOSIS — R609 Edema, unspecified: Secondary | ICD-10-CM

## 2018-03-26 DIAGNOSIS — Z820 Family history of epilepsy and other diseases of the nervous system: Secondary | ICD-10-CM

## 2018-03-26 DIAGNOSIS — Z880 Allergy status to penicillin: Secondary | ICD-10-CM

## 2018-03-26 DIAGNOSIS — Z8661 Personal history of infections of the central nervous system: Secondary | ICD-10-CM

## 2018-03-26 DIAGNOSIS — E039 Hypothyroidism, unspecified: Secondary | ICD-10-CM | POA: Diagnosis present

## 2018-03-26 DIAGNOSIS — I739 Peripheral vascular disease, unspecified: Secondary | ICD-10-CM | POA: Diagnosis present

## 2018-03-26 DIAGNOSIS — I471 Supraventricular tachycardia, unspecified: Secondary | ICD-10-CM | POA: Diagnosis present

## 2018-03-26 DIAGNOSIS — Z716 Tobacco abuse counseling: Secondary | ICD-10-CM

## 2018-03-26 DIAGNOSIS — R600 Localized edema: Secondary | ICD-10-CM | POA: Diagnosis present

## 2018-03-26 DIAGNOSIS — I472 Ventricular tachycardia: Secondary | ICD-10-CM | POA: Diagnosis present

## 2018-03-26 HISTORY — DX: Hypothyroidism, unspecified: E03.9

## 2018-03-26 LAB — COMPREHENSIVE METABOLIC PANEL
ALBUMIN: 3.8 g/dL (ref 3.5–5.0)
ALK PHOS: 40 U/L (ref 38–126)
ALT: 43 U/L (ref 17–63)
ANION GAP: 10 (ref 5–15)
AST: 33 U/L (ref 15–41)
BUN: 9 mg/dL (ref 6–20)
CO2: 23 mmol/L (ref 22–32)
Calcium: 9 mg/dL (ref 8.9–10.3)
Chloride: 102 mmol/L (ref 101–111)
Creatinine, Ser: 1.24 mg/dL (ref 0.61–1.24)
GFR calc Af Amer: >60 mL/min (ref >60)
GFR calc non Af Amer: >60 mL/min (ref >60)
GLUCOSE: 95 mg/dL (ref 65–99)
Potassium: 3.5 mmol/L (ref 3.5–5.1)
SODIUM: 135 mmol/L (ref 135–145)
Total Bilirubin: 0.5 mg/dL (ref 0.3–1.2)
Total Protein: 7.2 g/dL (ref 6.5–8.1)

## 2018-03-26 LAB — I-STAT CHEM 8, ED
BUN: 8 mg/dL (ref 6–20)
CHLORIDE: 104 mmol/L (ref 101–111)
Calcium, Ion: 1.19 mmol/L (ref 1.15–1.40)
Creatinine, Ser: 1.3 mg/dL — ABNORMAL HIGH (ref 0.61–1.24)
GLUCOSE: 92 mg/dL (ref 65–99)
HCT: 40 % (ref 39.0–52.0)
Hemoglobin: 13.6 g/dL (ref 13.0–17.0)
POTASSIUM: 3.7 mmol/L (ref 3.5–5.1)
Sodium: 140 mmol/L (ref 135–145)
TCO2: 24 mmol/L (ref 22–32)

## 2018-03-26 LAB — CBC WITH DIFFERENTIAL/PLATELET
BASOS ABS: 0.1 10*3/uL (ref 0.0–0.1)
BASOS PCT: 1 %
EOS ABS: 0.5 10*3/uL (ref 0.0–0.7)
Eosinophils Relative: 6 %
HCT: 39.9 % (ref 39.0–52.0)
HEMOGLOBIN: 13 g/dL (ref 13.0–17.0)
Lymphocytes Relative: 26 %
Lymphs Abs: 2 10*3/uL (ref 0.7–4.0)
MCH: 29.9 pg (ref 26.0–34.0)
MCHC: 32.6 g/dL (ref 30.0–36.0)
MCV: 91.7 fL (ref 78.0–100.0)
MONOS PCT: 9 %
Monocytes Absolute: 0.7 10*3/uL (ref 0.1–1.0)
NEUTROS PCT: 58 %
Neutro Abs: 4.5 10*3/uL (ref 1.7–7.7)
Platelets: 171 10*3/uL (ref 150–400)
RBC: 4.35 MIL/uL (ref 4.22–5.81)
RDW: 14.6 % (ref 11.5–15.5)
WBC: 7.8 10*3/uL (ref 4.0–10.5)

## 2018-03-26 LAB — MAGNESIUM: Magnesium: 1.9 mg/dL (ref 1.7–2.4)

## 2018-03-26 LAB — TROPONIN I

## 2018-03-26 LAB — I-STAT TROPONIN, ED: Troponin i, poc: 0 ng/mL (ref 0.00–0.08)

## 2018-03-26 LAB — PHOSPHORUS: Phosphorus: 3.5 mg/dL (ref 2.5–4.6)

## 2018-03-26 LAB — TSH: TSH: 4.697 u[IU]/mL — ABNORMAL HIGH (ref 0.350–4.500)

## 2018-03-26 MED ORDER — DILTIAZEM HCL-DEXTROSE 100-5 MG/100ML-% IV SOLN (PREMIX)
5.0000 mg/h | Freq: Once | INTRAVENOUS | Status: AC
  Administered 2018-03-26: 5 mg/h via INTRAVENOUS

## 2018-03-26 MED ORDER — DILTIAZEM HCL-DEXTROSE 100-5 MG/100ML-% IV SOLN (PREMIX)
INTRAVENOUS | Status: AC
  Administered 2018-03-26: 5 mg/h via INTRAVENOUS
  Filled 2018-03-26: qty 100

## 2018-03-26 MED ORDER — ZOLPIDEM TARTRATE 5 MG PO TABS
5.0000 mg | ORAL_TABLET | Freq: Every evening | ORAL | Status: DC | PRN
  Administered 2018-03-26: 5 mg via ORAL
  Filled 2018-03-26: qty 1

## 2018-03-26 MED ORDER — GI COCKTAIL ~~LOC~~: 30.0000 mL | Freq: Four times a day (QID) | ORAL | Status: DC | PRN

## 2018-03-26 MED ORDER — HYDRALAZINE HCL 20 MG/ML IJ SOLN: 10.0000 mg | Freq: Three times a day (TID) | INTRAMUSCULAR | Status: DC | PRN

## 2018-03-26 MED ORDER — MORPHINE SULFATE (PF) 2 MG/ML IV SOLN: 2.0000 mg | INTRAVENOUS | Status: DC | PRN

## 2018-03-26 MED ORDER — METOPROLOL TARTRATE 5 MG/5ML IV SOLN: 5.0000 mg | Freq: Four times a day (QID) | INTRAVENOUS | Status: DC | PRN

## 2018-03-26 MED ORDER — LEVOTHYROXINE SODIUM 100 MCG PO TABS
100.0000 ug | ORAL_TABLET | Freq: Every day | ORAL | Status: DC
  Administered 2018-03-27: 100 ug via ORAL
  Filled 2018-03-26: qty 1

## 2018-03-26 MED ORDER — ONDANSETRON HCL 4 MG/2ML IJ SOLN: 4.0000 mg | Freq: Four times a day (QID) | INTRAMUSCULAR | Status: DC | PRN

## 2018-03-26 MED ORDER — SODIUM CHLORIDE 0.9 % IV SOLN
INTRAVENOUS | Status: AC
  Administered 2018-03-26: 21:00:00 via INTRAVENOUS

## 2018-03-26 MED ORDER — DILTIAZEM HCL-DEXTROSE 100-5 MG/100ML-% IV SOLN (PREMIX)
INTRAVENOUS | Status: AC
  Administered 2018-03-26: 21:00:00
  Filled 2018-03-26: qty 100

## 2018-03-26 MED ORDER — ADENOSINE 6 MG/2ML IV SOLN
INTRAVENOUS | Status: AC
  Filled 2018-03-26: qty 10

## 2018-03-26 MED ORDER — ENOXAPARIN SODIUM 40 MG/0.4ML ~~LOC~~ SOLN
40.0000 mg | SUBCUTANEOUS | Status: DC
  Administered 2018-03-26: 40 mg via SUBCUTANEOUS
  Filled 2018-03-26: qty 0.4

## 2018-03-26 MED ORDER — LISINOPRIL 10 MG PO TABS
20.0000 mg | ORAL_TABLET | Freq: Every day | ORAL | Status: DC
  Administered 2018-03-26: 20 mg via ORAL
  Filled 2018-03-26 (×2): qty 2

## 2018-03-26 MED ORDER — ADENOSINE 6 MG/2ML IV SOLN
6.0000 mg | Freq: Once | INTRAVENOUS | Status: AC
  Administered 2018-03-26: 6 mg via INTRAVENOUS

## 2018-03-26 MED ORDER — METOPROLOL TARTRATE 5 MG/5ML IV SOLN
5.0000 mg | Freq: Once | INTRAVENOUS | Status: AC
  Administered 2018-03-26: 5 mg via INTRAVENOUS
  Filled 2018-03-26: qty 5

## 2018-03-26 MED ORDER — DILTIAZEM HCL-DEXTROSE 100-5 MG/100ML-% IV SOLN (PREMIX)
5.0000 mg/h | Freq: Once | INTRAVENOUS | Status: AC
  Administered 2018-03-26: 15 mg/h via INTRAVENOUS
  Filled 2018-03-26: qty 100

## 2018-03-26 MED ORDER — DILTIAZEM HCL 25 MG/5ML IV SOLN
10.0000 mg | Freq: Once | INTRAVENOUS | Status: AC
  Administered 2018-03-26: 10 mg via INTRAVENOUS

## 2018-03-26 MED ORDER — IBUPROFEN 400 MG PO TABS
200.0000 mg | ORAL_TABLET | Freq: Four times a day (QID) | ORAL | Status: DC | PRN
  Administered 2018-03-27: 400 mg via ORAL
  Filled 2018-03-26: qty 1

## 2018-03-26 MED ORDER — ACETAMINOPHEN 325 MG PO TABS: 650.0000 mg | ORAL_TABLET | ORAL | Status: DC | PRN

## 2018-03-26 MED ORDER — POTASSIUM CHLORIDE CRYS ER 20 MEQ PO TBCR
30.0000 meq | EXTENDED_RELEASE_TABLET | Freq: Once | ORAL | Status: AC
  Administered 2018-03-26: 30 meq via ORAL
  Filled 2018-03-26: qty 1

## 2018-03-26 MED ORDER — ADENOSINE 6 MG/2ML IV SOLN
12.0000 mg | Freq: Once | INTRAVENOUS | Status: AC
  Administered 2018-03-26: 12 mg via INTRAVENOUS

## 2018-03-26 NOTE — ED Notes (Signed)
Pt keeps converting to a NSR , rate in the 60's.  Will stay  NSR for minutes and then go back into a sinus tach in the 120's.  Pt no distress.

## 2018-03-26 NOTE — Progress Notes (Signed)
   BP (!) 131/100 (BP Location: Right Arm, Patient Position: Sitting, Cuff Size: Normal)   Pulse (!) 141   Temp 98.1 F (36.7 C)   Wt 219 lb 12 oz (99.7 kg)   SpO2 97%   BMI 33.41 kg/m    Subjective:    Patient ID: David Mosley, male    DOB: 08/17/1956, 62 y.o.   MRN: [id number]  HPI: David Mosley is a 62 y.o. male presenting on 03/26/2018 for Hypertension   HPI   Pt in for his regular follow up on HTN.  He was started on amlodipine at last OV but he says he stopped taking it due to LE edema.    Pt says he has been feeling dizzy when he bends over for about the past 2 weeks but he doesn't think it is his sinuses.  He denies CP or SOB.   He says that sometimes he has been having HA.    Relevant past medical, surgical, family and social history reviewed and updated as indicated. Interim medical history since our last visit reviewed. Allergies and medications reviewed and updated.  CURRENT MEDS: Levothyroxine 100mcg po qd Lisinopril 20mg  po qd norvasc 5mg  po qd- PT NOT TAKING Ranitidine 300 mg po qhs TAC ointment prn Allegra allergy OTC prn  Review of Systems  Per HPI unless specifically indicated above     Objective:    BP (!) 131/100 (BP Location: Right Arm, Patient Position: Sitting, Cuff Size: Normal)   Pulse (!) 141   Temp 98.1 F (36.7 C)   Wt 219 lb 12 oz (99.7 kg)   SpO2 97%   BMI 33.41 kg/m   Wt Readings from Last 3 Encounters:  03/26/18 219 lb 12 oz (99.7 kg)  02/26/18 215 lb (97.5 kg)  01/29/18 216 lb (98 kg)    Physical Exam  Constitutional: He is oriented to person, place, and time. He appears well-developed and well-nourished.  HENT:  Head: Normocephalic and atraumatic.  Neck: Neck supple.  Cardiovascular: Regular rhythm. Tachycardia present.  Pulmonary/Chest: Effort normal.  Musculoskeletal: He exhibits edema.  Neurological: He is alert and oriented to person, place, and time.  Skin: Skin is warm and dry.  Psychiatric: He has a normal mood and  affect. His behavior is normal.  Nursing note and vitals reviewed.  EKG- svt likely, fast and abnormal- significant change from EKG done 2018 that was normal     Assessment & Plan:   Encounter Diagnoses  Name Primary?  . Tachycardia Yes  . Essential hypertension   . Hypothyroidism, unspecified type   . Edema, unspecified type      -discussed with pt abnormal EKG and recommendation to call EMS for transport to [facility].  Pt refused EMS but agree to have his sister drive him straight to the ER from our office.  Pt's sister is aware that he needs to go directly to ER without detours or delays.

## 2018-03-26 NOTE — H&P (Addendum)
[facility] History and Physical  David Mosley ZHY:[id number] DOB: 11/09/1956 DOA: 03/26/2018  Referring physician:  PCP: Patient, No Pcp Per   Chief Complaint: "My doctor made me promise to come."  HPI: David NewcomerDonnie Mosley is a 62 y.o. male medical history significant for hypertension and thyroid disease patient states that he came to the emergency room only at the urging of his primary care doctor.  Went to his primary's office for basic follow-up visit.  Patient has been asymptomatic.  Check and he was found to be tachycardic.  Physician also I checked him out and so that heart rate was in the extreme range and urged him to go by EMS.  Patient declined having his sister drive him to the ER.  ED course: Patient given multiple doses of adenosine, Cardizem and Lopressor with heart rate decreased from the 140s to the 120s.  Hospitalist consulted for admission.   Review of Systems:  As per HPI otherwise 10 point review of systems negative.    Past Medical History:  Diagnosis Date  . Hypertension   . Meningitis   . Pneumonia   . Thyroid disease    Hypothyroid   History reviewed. No pertinent surgical history. Social History:  reports that he has been smoking cigarettes.  He has a 29.25 pack-year smoking history. He has never used smokeless tobacco. He reports that he does not drink alcohol or use drugs.  Allergies  Allergen Reactions  . Penicillins Itching    Has patient had a PCN reaction causing immediate rash, facial/tongue/throat swelling, SOB or lightheadedness with hypotension: No Has patient had a PCN reaction causing severe rash involving mucus membranes or skin necrosis: No Has patient had a PCN reaction that required hospitalization: No Has patient had a PCN reaction occurring within the last 10 years: No\ If all of the above answers are "NO", then may proceed with Cephalosporin use.    Family History  Problem Relation Age of Onset  . Alzheimer's disease Mother   . Cancer  Father        Prostate     Prior to Admission medications   Medication Sig Start Date End Date Taking? Authorizing Provider  levothyroxine (SYNTHROID, LEVOTHROID) 100 MCG tablet Take 1 tablet (100 mcg total) by mouth daily. 01/29/18  Yes Jacquelin HawkingMcElroy, Shannon, PA-C  lisinopril (PRINIVIL,ZESTRIL) 20 MG tablet Take 1 tablet (20 mg total) by mouth daily. 01/29/18  Yes Jacquelin HawkingMcElroy, Shannon, PA-C  amLODipine (NORVASC) 5 MG tablet Take 1 tablet (5 mg total) by mouth daily. Patient not taking: Reported on 03/26/2018 02/26/18   Jacquelin HawkingMcElroy, Shannon, PA-C   Physical Exam: Vitals:   03/26/18 1734 03/26/18 1800 03/26/18 1825 03/26/18 1830  BP: (!) 147/111 113/85 (!) 151/91 136/86  Pulse: (!) 126 64 66 (!) 125  Resp: 17 19 15 16   Temp:      TempSrc:      SpO2: 95% 98% 99% 98%  Weight:      Height:        Wt Readings from Last 3 Encounters:  03/26/18 99.3 kg (219 lb)  03/26/18 99.7 kg (219 lb 12 oz)  02/26/18 97.5 kg (215 lb)    General:  Appears calm and comfortable; A&Ox3 Eyes:  PERRL, EOMI, normal lids, iris ENT:  grossly normal hearing, lips & tongue Neck:  no LAD, masses or thyromegaly Cardiovascular:  tachycardia, no m/r/g. No LE edema.  Respiratory:  CTA bilaterally, no w/r/r. Normal respiratory effort. Abdomen:  soft, ntnd Skin:  no rash or induration seen  on limited exam Musculoskeletal:  grossly normal tone BUE/BLE Psychiatric:  grossly normal mood and affect, speech fluent and appropriate Neurologic:  CN 2-12 grossly intact, moves all extremities in coordinated fashion.          Labs on Admission:  Basic Metabolic Panel: Recent Labs  Lab 03/26/18 1545 03/26/18 1550  NA 135 140  K 3.5 3.7  CL 102 104  CO2 23  --   GLUCOSE 95 92  BUN 9 8  CREATININE 1.24 1.30*  CALCIUM 9.0  --    Liver Function Tests: Recent Labs  Lab 03/26/18 1545  AST 33  ALT 43  ALKPHOS 40  BILITOT 0.5  PROT 7.2  ALBUMIN 3.8   No results for input(s): LIPASE, AMYLASE in the last 168 hours. No  results for input(s): AMMONIA in the last 168 hours. CBC: Recent Labs  Lab 03/26/18 1545 03/26/18 1550  WBC 7.8  --   NEUTROABS 4.5  --   HGB 13.0 13.6  HCT 39.9 40.0  MCV 91.7  --   PLT 171  --    Cardiac Enzymes: No results for input(s): CKTOTAL, CKMB, CKMBINDEX, TROPONINI in the last 168 hours.  BNP (last 3 results) No results for input(s): BNP in the last 8760 hours.  ProBNP (last 3 results) No results for input(s): PROBNP in the last 8760 hours.   Serum creatinine: 1.3 mg/dL (H) 09/81/19 1478 Estimated creatinine clearance: 71.4 mL/min (A)  CBG: No results for input(s): GLUCAP in the last 168 hours.  Radiological Exams on Admission: Dg Chest Portable 1 View  Result Date: 03/26/2018 CLINICAL DATA:  Irregular heartbeat. EXAM: PORTABLE CHEST 1 VIEW COMPARISON:  Chest x-ray report 02/28/1999. FINDINGS: Cardiomegaly. Mild pulmonary venous congestion bilateral interstitial prominence. Findings suggest mild CHF. Tiny right-sided pleural effusion may be present. No pneumothorax. IMPRESSION: Cardiomegaly with mild pulmonary venous congestion, bilateral interstitial prominence, and small right pleural suggesting mild CHF. Electronically Signed   By: Maisie Fus  Register   On: 03/26/2018 15:58    EKG: Independently reviewed. SVT.  Assessment/Plan Active Problems:   SVT (supraventricular tachycardia) (HCC)   SVT (resolved) Serial trop Check Mag, Phos, iCa Prn lopressor for tachycardia ECHO ordered Consider cardio consult in AM  Hypertension When necessary hydralazine 10 mg IV as needed for severe blood pressure Cont lisinopril  Low Thyroid Checked TSH Cont synthorid  CKD Monitor Cr daily Cr at baseline,  1.2-1.3  Code Status: FC  DVT Prophylaxis: lovenox Family Communication: sister Disposition Plan: Pending Improvement  Status: inpt  Haydee Salter, MD Family Medicine [facility] [email or web address] Password [email or web address]

## 2018-03-26 NOTE — ED Provider Notes (Signed)
[facility] EMERGENCY DEPARTMENT Provider Note   CSN: [id number] Arrival date & time: 03/26/18  1421     History   Chief Complaint Chief Complaint  Patient presents with  . Irregular Heartbeat    HPI Whalen Trompeter is a 62 y.o. male.  Patient states that he went to the doctor's office today and they noted his heart was beating fast  The history is provided by the patient.  Palpitations   This is a new problem. The current episode started more than 1 week ago. The problem occurs constantly. The problem has not changed since onset.The problem is associated with stress. Pertinent negatives include no diaphoresis, no chest pain, no abdominal pain, no headaches, no back pain and no cough.    Past Medical History:  Diagnosis Date  . Hypertension   . Meningitis   . Pneumonia   . Thyroid disease    Hypothyroid    Patient Active Problem List   Diagnosis Date Noted  . SVT (supraventricular tachycardia) (HCC) 03/26/2018  . Essential hypertension, benign 05/29/2016  . Thyroid activity decreased 03/21/2016  . Edema 03/21/2016  . PVD (peripheral vascular disease) (HCC) 03/21/2016  . Cigarette nicotine dependence with nicotine-induced disorder 03/21/2016    History reviewed. No pertinent surgical history.      Home Medications    Prior to Admission medications   Medication Sig Start Date End Date Taking? Authorizing Provider  levothyroxine (SYNTHROID, LEVOTHROID) 100 MCG tablet Take 1 tablet (100 mcg total) by mouth daily. 01/29/18  Yes Jacquelin Hawking, PA-C  lisinopril (PRINIVIL,ZESTRIL) 20 MG tablet Take 1 tablet (20 mg total) by mouth daily. 01/29/18  Yes Jacquelin Hawking, PA-C  amLODipine (NORVASC) 5 MG tablet Take 1 tablet (5 mg total) by mouth daily. Patient not taking: Reported on 03/26/2018 02/26/18   Jacquelin Hawking, PA-C    Family History Family History  Problem Relation Age of Onset  . Alzheimer's disease Mother   . Cancer Father        Prostate    Social  History Social History   Tobacco Use  . Smoking status: Current Every Day Smoker    Packs/day: 0.75    Years: 39.00    Pack years: 29.25    Types: Cigarettes  . Smokeless tobacco: Never Used  Substance Use Topics  . Alcohol use: No    Comment: previous heavy drinker. quit 2001  . Drug use: No    Types: Marijuana     Allergies   Penicillins   Review of Systems Review of Systems  Constitutional: Negative for appetite change, diaphoresis and fatigue.  HENT: Negative for congestion, ear discharge and sinus pressure.   Eyes: Negative for discharge.  Respiratory: Negative for cough.   Cardiovascular: Positive for palpitations. Negative for chest pain.  Gastrointestinal: Negative for abdominal pain and diarrhea.  Genitourinary: Negative for frequency and hematuria.  Musculoskeletal: Negative for back pain.  Skin: Negative for rash.  Neurological: Negative for seizures and headaches.  Psychiatric/Behavioral: Negative for hallucinations.     Physical Exam Updated Vital Signs BP (!) 147/111   Pulse (!) 126   Temp 98.5 F (36.9 C) (Oral)   Resp 17   Ht 5\' 10"  (1.778 m)   Wt 99.3 kg (219 lb)   SpO2 95%   BMI 31.42 kg/m   Physical Exam  Constitutional: He is oriented to person, place, and time. He appears well-developed.  HENT:  Head: Normocephalic.  Eyes: Conjunctivae and EOM are normal. No scleral icterus.  Neck: Neck supple.  No thyromegaly present.  Cardiovascular: Exam reveals no gallop and no friction rub.  No murmur heard. Tachycardia  Pulmonary/Chest: No stridor. He has no wheezes. He has no rales. He exhibits no tenderness.  Abdominal: He exhibits no distension. There is no tenderness. There is no rebound.  Musculoskeletal: Normal range of motion. He exhibits no edema.  Lymphadenopathy:    He has no cervical adenopathy.  Neurological: He is oriented to person, place, and time. He exhibits normal muscle tone. Coordination normal.  Skin: No rash noted. No  erythema.  Psychiatric: He has a normal mood and affect. His behavior is normal.     ED Treatments / Results  Labs (all labs ordered are listed, but only abnormal results are displayed) Labs Reviewed  TSH - Abnormal; Notable for the following components:      Result Value   TSH 4.697 (*)    All other components within normal limits  I-STAT CHEM 8, ED - Abnormal; Notable for the following components:   Creatinine, Ser 1.30 (*)    All other components within normal limits  CBC WITH DIFFERENTIAL/PLATELET  COMPREHENSIVE METABOLIC PANEL  I-STAT TROPONIN, ED    EKG   Radiology Dg Chest Portable 1 View  Result Date: 03/26/2018 CLINICAL DATA:  Irregular heartbeat. EXAM: PORTABLE CHEST 1 VIEW COMPARISON:  Chest x-ray report 02/28/1999. FINDINGS: Cardiomegaly. Mild pulmonary venous congestion bilateral interstitial prominence. Findings suggest mild CHF. Tiny right-sided pleural effusion may be present. No pneumothorax. IMPRESSION: Cardiomegaly with mild pulmonary venous congestion, bilateral interstitial prominence, and small right pleural suggesting mild CHF. Electronically Signed   By: Maisie Fushomas  Register   On: 03/26/2018 15:58    Procedures Procedures (including critical care time)  Medications Ordered in ED Medications  metoprolol tartrate (LOPRESSOR) injection 5 mg (5 mg Intravenous Given 03/26/18 1538)  adenosine (ADENOCARD) 6 MG/2ML injection 6 mg (6 mg Intravenous Given 03/26/18 1600)  adenosine (ADENOCARD) 6 MG/2ML injection 12 mg (12 mg Intravenous Given 03/26/18 1611)  diltiazem (CARDIZEM) injection 10 mg (10 mg Intravenous Given 03/26/18 1620)  diltiazem (CARDIZEM) 100 mg in dextrose 5% 100mL (1 mg/mL) infusion (15 mg/hr Intravenous Rate/Dose Change 03/26/18 1705)  diltiazem (CARDIZEM) injection 10 mg (10 mg Intravenous Given 03/26/18 1628)     Initial Impression / Assessment and Plan / ED Course  I have reviewed the triage vital signs and the nursing notes.  Pertinent labs & imaging  results that were available during my care of the patient were reviewed by me and considered in my medical decision making (see chart for details).   CRITICAL CARE Performed by: Bethann BerkshireJoseph Remington Skalsky Total critical care time:40 minutes Critical care time was exclusive of separately billable procedures and treating other patients. Critical care was necessary to treat or prevent imminent or life-threatening deterioration. Critical care was time spent personally by me on the following activities: development of treatment plan with patient and/or surrogate as well as nursing, discussions with consultants, evaluation of patient's response to treatment, examination of patient, obtaining history from patient or surrogate, ordering and performing treatments and interventions, ordering and review of laboratory studies, ordering and review of radiographic studies, pulse oximetry and re-evaluation of patient's condition. Patient has a tachycardia that I suspect is in SVT.  He was given adenosine 6 mg and his rate slowed down and then went right back to the fast rate same thing happened with adenosine 12 mg.  He was also given Lopressor with an hour result.  Patient then was placed on Cardizem and his rate  slowed down to 120 while he was on a drip.  Went into a sinus rhythm for a short period time rate about 62.  Patient is presently on a Cardizem drip and has a rate of 120.  He will be admitted to medicine for further workup of SVT Final Clinical Impressions(s) / ED Diagnoses   Final diagnoses:  None    ED Discharge Orders    None       Bethann Berkshire, MD 03/26/18 1749

## 2018-03-26 NOTE — Progress Notes (Signed)
Pt refused to change to antimicrobial gown. Pt also is having periods where his oxygen saturation drops into the mid 80s. He refuses to wear any oxygen. When attempting to give him a nasal cannula to wear he states, "I dont need that shit". When told his oxygen levels were getting lower than the nursing staff was comfortable with he states ' I dont care".

## 2018-03-26 NOTE — ED Triage Notes (Addendum)
[facility] and was told to come to the ED for evaluation of irregular heartbeat. [facility] and pt was found to have Atrial Flutter with RVR, HR 138bpm. Denies chest pain and SOB.

## 2018-03-26 NOTE — Progress Notes (Signed)
Pt currently on Cardizem drip at 15mg /hr. He has periods of NSR with 1st degree AV block at a rate between 58-65 bpm. Then rate and rhythm will switch to a junctional tachycardia at a rate of 120-130 bpm. Pt remains on drip and cardiac monitoring.

## 2018-03-27 ENCOUNTER — Encounter (HOSPITAL_COMMUNITY): Payer: Self-pay | Admitting: Student

## 2018-03-27 ENCOUNTER — Inpatient Hospital Stay (HOSPITAL_COMMUNITY): Payer: Self-pay

## 2018-03-27 ENCOUNTER — Other Ambulatory Visit: Payer: Self-pay | Admitting: Physician Assistant

## 2018-03-27 DIAGNOSIS — I340 Nonrheumatic mitral (valve) insufficiency: Secondary | ICD-10-CM

## 2018-03-27 DIAGNOSIS — I472 Ventricular tachycardia: Secondary | ICD-10-CM

## 2018-03-27 DIAGNOSIS — I471 Supraventricular tachycardia: Principal | ICD-10-CM

## 2018-03-27 LAB — MAGNESIUM: Magnesium: 2 mg/dL (ref 1.7–2.4)

## 2018-03-27 LAB — TROPONIN I: Troponin I: <0.03 ng/mL (ref <0.03)

## 2018-03-27 LAB — ECHOCARDIOGRAM COMPLETE
HEIGHTINCHES: 70 in
WEIGHTICAEL: 3403.9 [oz_av]

## 2018-03-27 LAB — MRSA PCR SCREENING: MRSA by PCR: NEGATIVE

## 2018-03-27 MED ORDER — DILTIAZEM HCL-DEXTROSE 100-5 MG/100ML-% IV SOLN (PREMIX)
5.0000 mg/h | INTRAVENOUS | Status: DC
  Administered 2018-03-27: 12.5 mg/h via INTRAVENOUS
  Administered 2018-03-27: 15 mg/h via INTRAVENOUS
  Filled 2018-03-27: qty 100

## 2018-03-27 MED ORDER — METOPROLOL SUCCINATE ER 25 MG PO TB24: 25.0000 mg | ORAL_TABLET | Freq: Every day | ORAL | 0 refills | Status: DC

## 2018-03-27 MED ORDER — TRIAMCINOLONE ACETONIDE 0.025 % EX OINT: 1.0000 "application " | TOPICAL_OINTMENT | Freq: Two times a day (BID) | CUTANEOUS | 0 refills | Status: DC

## 2018-03-27 MED ORDER — LISINOPRIL 20 MG PO TABS
20.0000 mg | ORAL_TABLET | Freq: Every day | ORAL | 1 refills | Status: DC
Start: 2018-03-27 — End: 2018-03-31

## 2018-03-27 MED ORDER — LEVOTHYROXINE SODIUM 100 MCG PO TABS: 100.0000 ug | ORAL_TABLET | Freq: Every day | ORAL | 1 refills | Status: DC

## 2018-03-27 MED ORDER — METOPROLOL SUCCINATE ER 25 MG PO TB24
25.0000 mg | ORAL_TABLET | Freq: Every day | ORAL | Status: DC
  Administered 2018-03-27: 25 mg via ORAL
  Filled 2018-03-27: qty 1

## 2018-03-27 NOTE — Discharge Summary (Signed)
Physician Discharge Summary  David Mosley ZOX:[id number] DOB: [birth year] DOA: 03/26/2018  PCP: Jacquelin Hawking, PA-C  Admit date: 03/26/2018  Discharge date: 03/27/2018  Admitted From:Home  Disposition:  Home  Recommendations for Outpatient Follow-up:  1. Follow up with PCP in 1 month 2. Follow up with EP Dr. Ladona Ridgel on 4/25 as scheduled  Home Health:N/A  Equipment/Devices:N/A  Discharge Condition:Stable  CODE STATUS: Full  Diet recommendation: Heart Healthy  Brief/Interim Summary:  David Mosley is a 62 y.o. male medical history significant for hypertension and thyroid disease patient states that he came to the emergency room only at the urging of his primary care doctor.  He was noted to be tachycardic during the visit and therefore was recommended to come to the ED where he was noted to have multiple episodes of SVT as well as some episodes of V. tach.  He was given adenosine, Lopressor and was started on a Cardizem drip and admitted to the stepdown unit.  He appears to have remained in sinus rhythm and has been seen in consultation with cardiology with recommendations to remain on Toprol-XL and cut down on his caffeine intake as he does drink up to 10 cups of coffee a day.  He has been scheduled a follow-up in 2 weeks with EP physician Dr. Ladona Ridgel.  2D echocardiogram has been performed with further evaluation of the study in the outpatient setting recommended.  Patient generally tends to be noncompliant with his medications and he is unsure of how willing he is to cut back on his caffeine intake and actually take this medication and follow-up.  He is otherwise stable for discharge at this time and would like to go home.   Discharge Diagnoses:  Active Problems:   SVT (supraventricular tachycardia) (HCC)  1. Paroxysmal SVT/episodes of V. tach.  Continue to work on reduction of caffeine intake.  2D echo performed during this hospitalization which will be reviewed in the outpatient setting  with Dr. Ladona Ridgel in the next 2 weeks.  He will be discharged on Toprol-XL 25 mg daily at this time. 2. Hypertension.  Continue home lisinopril and amlodipine with good control noted. 3. Hypothyroidism.  TSH noted to be slightly elevated at 4.697.  We will not make any further adjustments to Synthroid at this time given paroxysmal SVT issue.  This can be addressed in the outpatient setting once arrhythmia is controlled and patient's caffeine intake is lowered. 4. Tobacco abuse.  Cessation has been advised, but once again patient does not seem willing to cut down.  Discharge Instructions  Discharge Instructions    Call MD for:  difficulty breathing, headache or visual disturbances   Complete by:  As directed    Call MD for:  extreme fatigue   Complete by:  As directed    Diet - low sodium heart healthy   Complete by:  As directed    Increase activity slowly   Complete by:  As directed      Allergies as of 03/27/2018      Reactions   Penicillins Itching   Has patient had a PCN reaction causing immediate rash, facial/tongue/throat swelling, SOB or lightheadedness with hypotension: No Has patient had a PCN reaction causing severe rash involving mucus membranes or skin necrosis: No Has patient had a PCN reaction that required hospitalization: No Has patient had a PCN reaction occurring within the last 10 years: No\ If all of the above answers are "NO", then may proceed with Cephalosporin use.  Medication List    TAKE these medications   amLODipine 5 MG tablet Commonly known as:  NORVASC Take 1 tablet (5 mg total) by mouth daily.   levothyroxine 100 MCG tablet Commonly known as:  SYNTHROID, LEVOTHROID Take 1 tablet (100 mcg total) by mouth daily.   lisinopril 20 MG tablet Commonly known as:  PRINIVIL,ZESTRIL Take 1 tablet (20 mg total) by mouth daily.   metoprolol succinate 25 MG 24 hr tablet Commonly known as:  TOPROL-XL Take 1 tablet (25 mg total) by mouth daily. Start  taking on:  03/28/2018      Follow-up Information    Marinus Mawaylor, Gregg W, MD Follow up on 04/11/2018.   Specialty:  Cardiology Why:  Cardiology Hospital Follow-Up on 04/11/2018 at 2:00PM.  Contact information: [address] [place]  [id number] [phone]        Jacquelin HawkingMcElroy, Shannon, PA-C Follow up in 1 month(s).   Specialty:  Physician Assistant Contact information: [address] [place] [address] [phone]          Allergies  Allergen Reactions  . Penicillins Itching    Has patient had a PCN reaction causing immediate rash, facial/tongue/throat swelling, SOB or lightheadedness with hypotension: No Has patient had a PCN reaction causing severe rash involving mucus membranes or skin necrosis: No Has patient had a PCN reaction that required hospitalization: No Has patient had a PCN reaction occurring within the last 10 years: No\ If all of the above answers are "NO", then may proceed with Cephalosporin use.    Consultations:  Cardiology Dr. Diona BrownerMcDowell   Procedures/Studies: Dg Chest Portable 1 View  Result Date: 03/26/2018 CLINICAL DATA:  Irregular heartbeat. EXAM: PORTABLE CHEST 1 VIEW COMPARISON:  Chest x-ray report 02/28/1999. FINDINGS: Cardiomegaly. Mild pulmonary venous congestion bilateral interstitial prominence. Findings suggest mild CHF. Tiny right-sided pleural effusion may be present. No pneumothorax. IMPRESSION: Cardiomegaly with mild pulmonary venous congestion, bilateral interstitial prominence, and small right pleural suggesting mild CHF. Electronically Signed   By: Maisie Fushomas  Register   On: 03/26/2018 15:58     Discharge Exam: Vitals:   03/27/18 0500 03/27/18 0800  BP: 116/75   Pulse: 60   Resp: 19   Temp:  98.3 F (36.8 C)  SpO2: (!) 89%    Vitals:   03/27/18 0430 03/27/18 0445 03/27/18 0500 03/27/18 0800  BP: 115/69 121/71 116/75   Pulse: (!) 56 (!) 58 60   Resp: 17 18 19    Temp:    98.3 F (36.8 C)  TempSrc:    Oral  SpO2: 90% 91% (!) 89%    Weight:   96.5 kg (212 lb 11.9 oz)   Height:        General: Pt is alert, awake, not in acute distress Cardiovascular: RRR, S1/S2 +, no rubs, no gallops Respiratory: CTA bilaterally, no wheezing, no rhonchi Abdominal: Soft, NT, ND, bowel sounds + Extremities: no edema, no cyanosis    The results of significant diagnostics from this hospitalization (including imaging, microbiology, ancillary and laboratory) are listed below for reference.     Microbiology: Recent Results (from the past 240 hour(s))  MRSA PCR Screening     Status: None   Collection Time: 03/26/18  8:39 PM  Result Value Ref Range Status   MRSA by PCR NEGATIVE NEGATIVE Final    Comment:        The GeneXpert MRSA Assay (FDA approved for NASAL specimens only), is one component of a comprehensive MRSA colonization surveillance program. It is not intended  to diagnose MRSA infection nor to guide or monitor treatment for MRSA infections. Performed at [facility], [address] [place], [place] [address]      Labs: BNP (last 3 results) No results for input(s): BNP in the last 8760 hours. Basic Metabolic Panel: Recent Labs  Lab 03/26/18 1545 03/26/18 1550 03/26/18 1937 03/27/18 0416  NA 135 140  --   --   K 3.5 3.7  --   --   CL 102 104  --   --   CO2 23  --   --   --   GLUCOSE 95 92  --   --   BUN 9 8  --   --   CREATININE 1.24 1.30*  --   --   CALCIUM 9.0  --   --   --   MG  --   --  1.9 2.0  PHOS  --   --  3.5  --    Liver Function Tests: Recent Labs  Lab 03/26/18 1545  AST 33  ALT 43  ALKPHOS 40  BILITOT 0.5  PROT 7.2  ALBUMIN 3.8   No results for input(s): LIPASE, AMYLASE in the last 168 hours. No results for input(s): AMMONIA in the last 168 hours. CBC: Recent Labs  Lab 03/26/18 1545 03/26/18 1550  WBC 7.8  --   NEUTROABS 4.5  --   HGB 13.0 13.6  HCT 39.9 40.0  MCV 91.7  --   PLT 171  --    Cardiac Enzymes: Recent Labs  Lab 03/26/18 1937 03/26/18 2158 03/27/18 0104  03/27/18 0416  TROPONINI <0.03 <0.03 <0.03 <0.03   BNP: Invalid input(s): POCBNP CBG: No results for input(s): GLUCAP in the last 168 hours. D-Dimer No results for input(s): DDIMER in the last 72 hours. Hgb A1c No results for input(s): HGBA1C in the last 72 hours. Lipid Profile No results for input(s): CHOL, HDL, LDLCALC, TRIG, CHOLHDL, LDLDIRECT in the last 72 hours. Thyroid function studies Recent Labs    03/26/18 1615  TSH 4.697*   Anemia work up No results for input(s): VITAMINB12, FOLATE, FERRITIN, TIBC, IRON, RETICCTPCT in the last 72 hours. Urinalysis No results found for: COLORURINE, APPEARANCEUR, LABSPEC, PHURINE, GLUCOSEU, HGBUR, BILIRUBINUR, KETONESUR, PROTEINUR, UROBILINOGEN, NITRITE, LEUKOCYTESUR Sepsis Labs Invalid input(s): PROCALCITONIN,  WBC,  LACTICIDVEN Microbiology Recent Results (from the past 240 hour(s))  MRSA PCR Screening     Status: None   Collection Time: 03/26/18  8:39 PM  Result Value Ref Range Status   MRSA by PCR NEGATIVE NEGATIVE Final    Comment:        The GeneXpert MRSA Assay (FDA approved for NASAL specimens only), is one component of a comprehensive MRSA colonization surveillance program. It is not intended to diagnose MRSA infection nor to guide or monitor treatment for MRSA infections. Performed at [facility], [address] [place], [place] [address]      Time coordinating discharge: 35 minutes  SIGNED:   Erick Blinks, DO [facility] 03/27/2018, 10:58 AM Pager [phone]  If 7PM-7AM, please contact night-coverage [email or web address] Password [email or web address]

## 2018-03-27 NOTE — Consult Note (Addendum)
Cardiology Consult    Patient ID: David Mosley; [id number]; [birth year]   Admit date: 03/26/2018 Date of Consult: 03/27/2018  Primary Care Provider: Jacquelin Hawking, PA-C Consulting Cardiologist: New to [facility] - Dr. Diona Browner  Patient Profile    David Mosley is a 62 y.o. male with past medical history of apparent PSVT, HTN, and Hypothyroidism who is being seen today for the evaluation of recurrent tachycardia at the request of Dr. Sherryll Burger.   History of Present Illness    David Mosley evaluated by his PCP yesterday afternoon and reported feeling dizzy when he would bend over for the past several weeks. Denied any associated chest pain or dyspnea. An EKG was performed in the setting of his tachycardia and showed an atrial tachycardia with heart rate of 139, therefore he was referred to the ED for further evaluation.   While in the ED, heart rate remain elevated in the 130's-150's. Adenosine 6 mg was administered with a transient decrease in his heart rate. Was given Adenosine 12 mg with a similar effect and was therefore started on Cardizem for rate control. Since, he has been monitored on telemetry and HR has been predominantly in the 50's to 60's with transient episodes of tachycardia with HR in the 120's to 130's.    Initial labs showed WBC 7.8, Hgb 13.0, platelets 171, Na+ 135, K+ 3.5, glucose 95, and creatinine 1.24. Initial and cyclic troponin values have been negative. TSH slightly elevated to 4.697. CXR shows cardiomegaly with mild pulmonary venous congestion and bilateral interstitial prominence suggestive of mild CHF.  In talking with the patient today, he reports having intermittent palpitations for the past several years. Reports these can last for a few seconds and then spontaneously resolved. Denies any associated chest pain, lightheadedness, dizziness, or presyncope. No recent orthopnea or PND.  He does notice occasional lower extremity edema which he feels is secondary to his blood pressure  medications. He was previously prescribed Amlodipine and Lisinopril for blood pressure control but says he has been unable to tolerate those. No known history of CAD, HLD or Type II DM.  He does smoke 15-20 cigarettes per day. Denies any alcohol use. Consumes between 5-11 cups of coffee per day.   Past Medical History:  Diagnosis Date  . Hypertension   . Hypothyroidism   . Meningitis   . Pneumonia     Past Surgical History:  Procedure Laterality Date  . NO PAST SURGERIES       Home Medications:  Prior to Admission medications   Medication Sig Start Date End Date Taking? Authorizing Provider  levothyroxine (SYNTHROID, LEVOTHROID) 100 MCG tablet Take 1 tablet (100 mcg total) by mouth daily. 01/29/18  Yes Jacquelin Hawking, PA-C  lisinopril (PRINIVIL,ZESTRIL) 20 MG tablet Take 1 tablet (20 mg total) by mouth daily. 01/29/18  Yes Jacquelin Hawking, PA-C  amLODipine (NORVASC) 5 MG tablet Take 1 tablet (5 mg total) by mouth daily. Patient not taking: Reported on 03/26/2018 02/26/18   Jacquelin Hawking, PA-C    Inpatient Medications: Scheduled Meds: . enoxaparin (LOVENOX) injection  40 mg Subcutaneous Q24H  . levothyroxine  100 mcg Oral QAC breakfast  . lisinopril  20 mg Oral Daily  . metoprolol succinate  25 mg Oral Daily   Continuous Infusions:  PRN Meds: acetaminophen, gi cocktail, hydrALAZINE, ibuprofen, metoprolol tartrate, ondansetron (ZOFRAN) IV, zolpidem  Allergies:    Allergies  Allergen Reactions  . Penicillins Itching    Has patient had a PCN reaction causing immediate rash, facial/tongue/throat swelling,  SOB or lightheadedness with hypotension: No Has patient had a PCN reaction causing severe rash involving mucus membranes or skin necrosis: No Has patient had a PCN reaction that required hospitalization: No Has patient had a PCN reaction occurring within the last 10 years: No\ If all of the above answers are "NO", then may proceed with Cephalosporin use.    Social  History:   Social History   Socioeconomic History  . Marital status: Divorced    Spouse name: Not on file  . Number of children: Not on file  . Years of education: Not on file  . Highest education level: Not on file  Occupational History  . Not on file  Social Needs  . Financial resource strain: Not on file  . Food insecurity:    Worry: Not on file    Inability: Not on file  . Transportation needs:    Medical: Not on file    Non-medical: Not on file  Tobacco Use  . Smoking status: Current Every Day Smoker    Packs/day: 0.75    Years: 39.00    Pack years: 29.25    Types: Cigarettes  . Smokeless tobacco: Never Used  Substance and Sexual Activity  . Alcohol use: No    Comment: previous heavy drinker. quit 2001  . Drug use: No    Types: Marijuana  . Sexual activity: Not on file  Lifestyle  . Physical activity:    Days per week: Not on file    Minutes per session: Not on file  . Stress: Not on file  Relationships  . Social connections:    Talks on phone: Not on file    Gets together: Not on file    Attends religious service: Not on file    Active member of club or organization: Not on file    Attends meetings of clubs or organizations: Not on file    Relationship status: Not on file  . Intimate partner violence:    Fear of current or ex partner: Not on file    Emotionally abused: Not on file    Physically abused: Not on file    Forced sexual activity: Not on file  Other Topics Concern  . Not on file  Social History Narrative  . Not on file     Family History:    Family History  Problem Relation Age of Onset  . Alzheimer's disease Mother   . Prostate cancer Father   . Arrhythmia Sister       Review of Systems    General:  No chills, fever, night sweats or weight changes.  Cardiovascular:  No chest pain, dyspnea on exertion, edema, orthopnea,  paroxysmal nocturnal dyspnea. Positive for palpitations.  Dermatological: No rash, lesions/masses Respiratory:  No cough, dyspnea Urologic: No hematuria, dysuria Abdominal:   No nausea, vomiting, diarrhea, bright red blood per rectum, melena, or hematemesis Neurologic:  No visual changes, wkns, changes in mental status. All other systems reviewed and are otherwise negative except as noted above.  Physical Exam/Data    Vitals:   03/27/18 0430 03/27/18 0445 03/27/18 0500 03/27/18 0800  BP: 115/69 121/71 116/75   Pulse: (!) 56 (!) 58 60   Resp: 17 18 19    Temp:    98.3 F (36.8 C)  TempSrc:    Oral  SpO2: 90% 91% (!) 89%   Weight:   212 lb 11.9 oz (96.5 kg)   Height:        Intake/Output Summary (Last 24  hours) at 03/27/2018 0947 Last data filed at 03/26/2018 2033 Gross per 24 hour  Intake 100 ml  Output -  Net 100 ml   Filed Weights   03/26/18 1516 03/26/18 2051 03/27/18 0500  Weight: 219 lb (99.3 kg) 212 lb 11.9 oz (96.5 kg) 212 lb 11.9 oz (96.5 kg)   Body mass index is 30.53 kg/m.   General: Pleasant, Caucasian male appearing in NAD Psych: Normal affect. Neuro: Alert and oriented X 3. Moves all extremities spontaneously. HEENT: Normal  Neck: Supple without bruits or JVD. Lungs:  Resp regular and unlabored, CTA without wheezing or rales. Heart: RRR, no s3, s4, or murmurs. Abdomen: Soft, non-tender, non-distended, BS + x 4.  Extremities: No clubbing or cyanosis. Trace lower extremity edema. DP/PT/Radials 2+ and equal bilaterally.  Telemetry:  Telemetry was personally reviewed and demonstrates: NSR, HR in 60's to 70's with intermittent bouts of tachycardia with HR in the 120's to 140's.    Labs/Studies     Relevant CV Studies:  Echocardiogram: 04/26/2016 Study Conclusions  - Left ventricle: The cavity size was normal. Wall thickness was   increased in a pattern of moderate LVH. Systolic function was   normal. The estimated ejection fraction was in the range of 55%   to 60%. Wall motion was normal; there were no regional wall   motion abnormalities. Left ventricular  diastolic function   parameters were normal. - Aortic valve: Mildly calcified annulus. Trileaflet; mildly   thickened leaflets. Valve area (VTI): 2.84 cm^2. Valve area   (Vmax): 2.87 cm^2. - Mitral valve: Mildly calcified annulus. Normal thickness leaflets  Laboratory Data:  Chemistry Recent Labs  Lab 03/26/18 1545 03/26/18 1550  NA 135 140  K 3.5 3.7  CL 102 104  CO2 23  --   GLUCOSE 95 92  BUN 9 8  CREATININE 1.24 1.30*  CALCIUM 9.0  --   GFRNONAA >60  --   GFRAA >60  --   ANIONGAP 10  --     Recent Labs  Lab 03/26/18 1545  PROT 7.2  ALBUMIN 3.8  AST 33  ALT 43  ALKPHOS 40  BILITOT 0.5   Hematology Recent Labs  Lab 03/26/18 1545 03/26/18 1550  WBC 7.8  --   RBC 4.35  --   HGB 13.0 13.6  HCT 39.9 40.0  MCV 91.7  --   MCH 29.9  --   MCHC 32.6  --   RDW 14.6  --   PLT 171  --    Cardiac Enzymes Recent Labs  Lab 03/26/18 1937 03/26/18 2158 03/27/18 0104 03/27/18 0416  TROPONINI <0.03 <0.03 <0.03 <0.03    Recent Labs  Lab 03/26/18 1548  TROPIPOC 0.00    BNPNo results for input(s): BNP, PROBNP in the last 168 hours.  DDimer No results for input(s): DDIMER in the last 168 hours.  Radiology/Studies:  Dg Chest Portable 1 View  Result Date: 03/26/2018 CLINICAL DATA:  Irregular heartbeat. EXAM: PORTABLE CHEST 1 VIEW COMPARISON:  Chest x-ray report 02/28/1999. FINDINGS: Cardiomegaly. Mild pulmonary venous congestion bilateral interstitial prominence. Findings suggest mild CHF. Tiny right-sided pleural effusion may be present. No pneumothorax. IMPRESSION: Cardiomegaly with mild pulmonary venous congestion, bilateral interstitial prominence, and small right pleural suggesting mild CHF. Electronically Signed   By: Maisie Fus  Register   On: 03/26/2018 15:58     Assessment & Plan    1. Paroxysmal SVT/ Possible Episodes of VT -The patient presented to the ED after being found to have an atrial  tachycardia while at his PCP's office. He does report of history  of intermittent palpitations occurring for several years.  He is unaware of any formal diagnosis but prior office notes mention SVT. - Initial labs show WBC 7.8, Hgb 13.0, platelets 171, Na+ 135, K+ 3.5, glucose 95, and creatinine 1.24. Initial and cyclic troponin values have been negative. TSH elevated to 4.697. - Heart rate temporarily improved with administration of Adenosine while in the ED.  He was started on IV Cardizem and heart rate has been variable in the 60's-80's with episodes of tachycardia into the 120's on telemetry.  His episodes of tachycardia on telemetry appear most consistent with SVT but he does have occasional episodes of a wide-complex tachycardia. Will further review telemetry and EKG tracings with MD.  - He insists on going home later today. Would wean down IV Cardizem and transition to Toprol-XL 25 mg daily. This can be further titrated as an outpatient.  An echocardiogram is pending to assess LV function and wall motion. Would anticipate further outpatient evaluation and likely EP referral if he can be compliant in the outpatient setting. He is consuming between 5-11 cups of coffee daily. Reduction of caffeine intake reviewed with the patient.   2. HTN - BP has been variable at 89/61-168/122 since admission.  BP improved to 116/75 on most recent check. He was previously prescribed Lisinopril and Amlodipine by his PCP but self-discontinued the medications due to side effects. - Will plan to stop Cardizem and transitioned to Toprol-XL as outlined above.  3. Hypothyroidism - TSH slightly elevated to 4.697. - per admitting team.  4. Tobacco Use - continues to smoke between 15-20 cigarettes per day. Cessation advised.    For questions or updates, please contact [facility] Please consult [email or web address] for contact info under Cardiology/STEMI.  Signed, Randall An, PA-C 03/27/2018, 9:47 AM Pager: [phone]   Attending note:  Patient seen and examined.   Records reviewed and case discussed with Ms. Iran Ouch PA-C. David Mosley presents with a history of hypertension and medication intolerances, hypothyroidism on Synthroid, long-standing tobacco abuse, and also a long-standing history of palpitations and what has been managed as PSVT intermittently.  He is now hospitalized with increasing episodes of dizziness and tachycardia.  Patient states that he has had intermittent palpitations for at least 10 years.  He does not report any sudden syncope with these episodes.  He does feel lightheaded sometimes and has a vague chest discomfort with more prolonged episodes.  He states that he drinks anywhere between 5 and 10 cups of coffee a day.  I reviewed his serial tracings including an old tracing from March 2018.  I also reviewed these tracings with Dr. Ladona Ridgel for an EP perspective.  Based on our discussion it looks like he has not only evidence of PSVT, but also intermittent ventricular tachycardia.  He does not have any obvious history of cardiomyopathy or known ischemic heart disease.  On examination this morning he appears comfortable.  He continues to have bursts of wide-complex tachycardia on telemetry consistent with his ECGs, is asymptomatic during these times.  Lungs exhibit decreased breath sounds without wheezing.  Cardiac exam reveals RRR without gallop.  Lab work reveals normal troponin I levels less than 0.03 on 4 occasions, hemoglobin 13.6, potassium 3.7, BUN 8, creatinine 1.3, TSH 4.697. Chest x-ray reports cardiomegaly with mild vascular congestion and a small right pleural effusion.  PSVT as well as suspected paroxysmal ventricular tachycardia.  I reviewed the patient's tracings with Dr. Ladona Ridgel.  Detailed discussion had with patient today.  He states that he plans to go home today.  He was also fairly frank in discussing his concerns about taking regular medications and hesitancy about follow-up for further cardiac assessment.  I have recommended that  at a minimum he cut back his caffeine use significantly, and we will start him on Toprol-XL 25 mg daily.  Will review his echocardiogram to assess cardiac structure and function.  We are also going to make him an office follow-up visit with Dr. Ladona Ridgelaylor to discuss options for further EP evaluation and management.  Ideally, he will need to have further ischemic testing as well if he agrees.  Jonelle SidleSamuel G. McDowell, M.D., F.A.C.C.

## 2018-03-27 NOTE — Progress Notes (Signed)
*  PRELIMINARY RESULTS* Echocardiogram 2D Echocardiogram has been performed.  Jeryl Columbialliott, Durk Carmen 03/27/2018, 9:11 AM

## 2018-03-28 ENCOUNTER — Other Ambulatory Visit: Payer: Self-pay

## 2018-03-28 ENCOUNTER — Emergency Department (HOSPITAL_COMMUNITY): Payer: Self-pay

## 2018-03-28 ENCOUNTER — Inpatient Hospital Stay (HOSPITAL_COMMUNITY)
Admission: EM | Admit: 2018-03-28 | Discharge: 2018-03-31 | DRG: 310 | Disposition: A | Discharge status: Home / Self Care | Payer: Self-pay | Attending: Internal Medicine | Admitting: Internal Medicine

## 2018-03-28 ENCOUNTER — Encounter (HOSPITAL_COMMUNITY): Payer: Self-pay

## 2018-03-28 DIAGNOSIS — Z8701 Personal history of pneumonia (recurrent): Secondary | ICD-10-CM

## 2018-03-28 DIAGNOSIS — I4510 Unspecified right bundle-branch block: Secondary | ICD-10-CM | POA: Diagnosis present

## 2018-03-28 DIAGNOSIS — Z720 Tobacco use: Secondary | ICD-10-CM | POA: Diagnosis present

## 2018-03-28 DIAGNOSIS — R000 Tachycardia, unspecified: Secondary | ICD-10-CM

## 2018-03-28 DIAGNOSIS — Z8249 Family history of ischemic heart disease and other diseases of the circulatory system: Secondary | ICD-10-CM

## 2018-03-28 DIAGNOSIS — F17210 Nicotine dependence, cigarettes, uncomplicated: Secondary | ICD-10-CM | POA: Diagnosis present

## 2018-03-28 DIAGNOSIS — Z8042 Family history of malignant neoplasm of prostate: Secondary | ICD-10-CM

## 2018-03-28 DIAGNOSIS — Z79890 Hormone replacement therapy (postmenopausal): Secondary | ICD-10-CM

## 2018-03-28 DIAGNOSIS — I472 Ventricular tachycardia, unspecified: Secondary | ICD-10-CM

## 2018-03-28 DIAGNOSIS — Z79899 Other long term (current) drug therapy: Secondary | ICD-10-CM

## 2018-03-28 DIAGNOSIS — Z7952 Long term (current) use of systemic steroids: Secondary | ICD-10-CM

## 2018-03-28 DIAGNOSIS — R740 Nonspecific elevation of levels of transaminase and lactic acid dehydrogenase [LDH]: Secondary | ICD-10-CM | POA: Diagnosis present

## 2018-03-28 DIAGNOSIS — I445 Left posterior fascicular block: Secondary | ICD-10-CM | POA: Diagnosis present

## 2018-03-28 DIAGNOSIS — Z888 Allergy status to other drugs, medicaments and biological substances status: Secondary | ICD-10-CM

## 2018-03-28 DIAGNOSIS — R7401 Elevation of levels of liver transaminase levels: Secondary | ICD-10-CM | POA: Diagnosis present

## 2018-03-28 DIAGNOSIS — G4730 Sleep apnea, unspecified: Secondary | ICD-10-CM | POA: Diagnosis present

## 2018-03-28 DIAGNOSIS — Z880 Allergy status to penicillin: Secondary | ICD-10-CM

## 2018-03-28 DIAGNOSIS — Z8661 Personal history of infections of the central nervous system: Secondary | ICD-10-CM

## 2018-03-28 DIAGNOSIS — T447X6A Underdosing of beta-adrenoreceptor antagonists, initial encounter: Secondary | ICD-10-CM | POA: Diagnosis present

## 2018-03-28 DIAGNOSIS — Y92009 Unspecified place in unspecified non-institutional (private) residence as the place of occurrence of the external cause: Secondary | ICD-10-CM

## 2018-03-28 DIAGNOSIS — I10 Essential (primary) hypertension: Secondary | ICD-10-CM | POA: Diagnosis present

## 2018-03-28 DIAGNOSIS — I739 Peripheral vascular disease, unspecified: Secondary | ICD-10-CM | POA: Diagnosis present

## 2018-03-28 DIAGNOSIS — E039 Hypothyroidism, unspecified: Secondary | ICD-10-CM | POA: Diagnosis present

## 2018-03-28 DIAGNOSIS — Z91128 Patient's intentional underdosing of medication regimen for other reason: Secondary | ICD-10-CM

## 2018-03-28 DIAGNOSIS — I4729 Other ventricular tachycardia: Secondary | ICD-10-CM

## 2018-03-28 DIAGNOSIS — I471 Supraventricular tachycardia: Secondary | ICD-10-CM | POA: Diagnosis present

## 2018-03-28 DIAGNOSIS — R079 Chest pain, unspecified: Secondary | ICD-10-CM

## 2018-03-28 HISTORY — DX: Tobacco use: Z72.0

## 2018-03-28 LAB — CBC
HCT: 40.9 % (ref 39.0–52.0)
Hemoglobin: 13.3 g/dL (ref 13.0–17.0)
MCH: 30 pg (ref 26.0–34.0)
MCHC: 32.5 g/dL (ref 30.0–36.0)
MCV: 92.1 fL (ref 78.0–100.0)
PLATELETS: 174 10*3/uL (ref 150–400)
RBC: 4.44 MIL/uL (ref 4.22–5.81)
RDW: 14.6 % (ref 11.5–15.5)
WBC: 7.5 10*3/uL (ref 4.0–10.5)

## 2018-03-28 LAB — COMPREHENSIVE METABOLIC PANEL
ALBUMIN: 3.3 g/dL — AB (ref 3.5–5.0)
ALT: 43 U/L (ref 17–63)
ALT: 52 U/L (ref 17–63)
ANION GAP: 8 (ref 5–15)
AST: 26 U/L (ref 15–41)
AST: 51 U/L — AB (ref 15–41)
Albumin: 3.7 g/dL (ref 3.5–5.0)
Alkaline Phosphatase: 44 U/L (ref 38–126)
Alkaline Phosphatase: 38 U/L (ref 38–126)
Anion gap: 9 (ref 5–15)
BUN: 13 mg/dL (ref 6–20)
BUN: 12 mg/dL (ref 6–20)
CHLORIDE: 105 mmol/L (ref 101–111)
CHLORIDE: 107 mmol/L (ref 101–111)
CO2: 22 mmol/L (ref 22–32)
CO2: 24 mmol/L (ref 22–32)
CREATININE: 1.2 mg/dL (ref 0.61–1.24)
Calcium: 9.2 mg/dL (ref 8.9–10.3)
Calcium: 8.6 mg/dL — ABNORMAL LOW (ref 8.9–10.3)
Creatinine, Ser: 1.18 mg/dL (ref 0.61–1.24)
GFR calc Af Amer: >60 mL/min (ref >60)
GFR calc non Af Amer: >60 mL/min (ref >60)
Glucose, Bld: 112 mg/dL — ABNORMAL HIGH (ref 65–99)
Glucose, Bld: 121 mg/dL — ABNORMAL HIGH (ref 65–99)
POTASSIUM: 4 mmol/L (ref 3.5–5.1)
Potassium: 3.5 mmol/L (ref 3.5–5.1)
Sodium: 138 mmol/L (ref 135–145)
Sodium: 137 mmol/L (ref 135–145)
Total Bilirubin: 0.2 mg/dL — ABNORMAL LOW (ref 0.3–1.2)
Total Bilirubin: 0.5 mg/dL (ref 0.3–1.2)
Total Protein: 7 g/dL (ref 6.5–8.1)
Total Protein: 6.5 g/dL (ref 6.5–8.1)

## 2018-03-28 LAB — TROPONIN I

## 2018-03-28 LAB — CALCIUM, IONIZED: CALCIUM, IONIZED, SERUM: 5 mg/dL (ref 4.5–5.6)

## 2018-03-28 LAB — HIV ANTIBODY (ROUTINE TESTING W REFLEX): HIV Screen 4th Generation wRfx: NONREACTIVE

## 2018-03-28 LAB — I-STAT TROPONIN, ED: TROPONIN I, POC: 0.01 ng/mL (ref 0.00–0.08)

## 2018-03-28 LAB — MAGNESIUM: MAGNESIUM: 2 mg/dL (ref 1.7–2.4)

## 2018-03-28 LAB — CBG MONITORING, ED: GLUCOSE-CAPILLARY: 96 mg/dL (ref 65–99)

## 2018-03-28 MED ORDER — ACETAMINOPHEN 325 MG PO TABS: 650.0000 mg | ORAL_TABLET | Freq: Four times a day (QID) | ORAL | Status: DC | PRN

## 2018-03-28 MED ORDER — MORPHINE SULFATE (PF) 4 MG/ML IV SOLN
INTRAVENOUS | Status: AC
  Filled 2018-03-28: qty 1

## 2018-03-28 MED ORDER — ENOXAPARIN SODIUM 40 MG/0.4ML ~~LOC~~ SOLN
40.0000 mg | SUBCUTANEOUS | Status: DC
  Administered 2018-03-28 – 2018-03-30 (×3): 40 mg via SUBCUTANEOUS
  Filled 2018-03-28 (×3): qty 0.4

## 2018-03-28 MED ORDER — ONDANSETRON HCL 4 MG/2ML IJ SOLN
INTRAMUSCULAR | Status: AC
  Administered 2018-03-28: 02:00:00
  Filled 2018-03-28: qty 2

## 2018-03-28 MED ORDER — ASPIRIN 81 MG PO CHEW
324.0000 mg | CHEWABLE_TABLET | Freq: Once | ORAL | Status: AC
  Administered 2018-03-28: 324 mg via ORAL
  Filled 2018-03-28: qty 4

## 2018-03-28 MED ORDER — MAGNESIUM SULFATE 4 GM/100ML IV SOLN
4.0000 g | Freq: Once | INTRAVENOUS | Status: AC
  Administered 2018-03-28: 4 g via INTRAVENOUS
  Filled 2018-03-28: qty 100

## 2018-03-28 MED ORDER — POTASSIUM CHLORIDE CRYS ER 20 MEQ PO TBCR
40.0000 meq | EXTENDED_RELEASE_TABLET | Freq: Once | ORAL | Status: AC
  Administered 2018-03-28: 40 meq via ORAL
  Filled 2018-03-28: qty 2

## 2018-03-28 MED ORDER — DILTIAZEM LOAD VIA INFUSION
10.0000 mg | Freq: Once | INTRAVENOUS | Status: AC
  Administered 2018-03-28: 10 mg via INTRAVENOUS
  Filled 2018-03-28: qty 10

## 2018-03-28 MED ORDER — ASPIRIN EC 81 MG PO TBEC
81.0000 mg | DELAYED_RELEASE_TABLET | Freq: Every day | ORAL | Status: DC
  Administered 2018-03-29 – 2018-03-31 (×3): 81 mg via ORAL
  Filled 2018-03-28 (×3): qty 1

## 2018-03-28 MED ORDER — ONDANSETRON HCL 4 MG/2ML IJ SOLN
4.0000 mg | Freq: Four times a day (QID) | INTRAMUSCULAR | Status: DC | PRN
Start: 2018-03-28 — End: 2018-03-31

## 2018-03-28 MED ORDER — ACETAMINOPHEN 650 MG RE SUPP: 650.0000 mg | Freq: Four times a day (QID) | RECTAL | Status: DC | PRN

## 2018-03-28 MED ORDER — LEVOTHYROXINE SODIUM 100 MCG PO TABS
100.0000 ug | ORAL_TABLET | Freq: Every day | ORAL | Status: DC
  Administered 2018-03-29 – 2018-03-31 (×3): 100 ug via ORAL
  Filled 2018-03-28 (×3): qty 1

## 2018-03-28 MED ORDER — ALPRAZOLAM 0.25 MG PO TABS
0.2500 mg | ORAL_TABLET | Freq: Two times a day (BID) | ORAL | Status: DC | PRN
Start: 2018-03-28 — End: 2018-03-31

## 2018-03-28 MED ORDER — METOPROLOL TARTRATE 5 MG/5ML IV SOLN
5.0000 mg | INTRAVENOUS | Status: AC | PRN
  Administered 2018-03-29 – 2018-03-30 (×3): 5 mg via INTRAVENOUS
  Filled 2018-03-28 (×3): qty 5

## 2018-03-28 MED ORDER — DILTIAZEM HCL-DEXTROSE 100-5 MG/100ML-% IV SOLN (PREMIX)
5.0000 mg/h | INTRAVENOUS | Status: DC
  Administered 2018-03-28 (×2): 15 mg/h via INTRAVENOUS
  Administered 2018-03-28: 5 mg/h via INTRAVENOUS
  Administered 2018-03-29 (×3): 15 mg/h via INTRAVENOUS
  Filled 2018-03-28 (×7): qty 100

## 2018-03-28 MED ORDER — ACETAMINOPHEN 325 MG PO TABS
650.0000 mg | ORAL_TABLET | Freq: Four times a day (QID) | ORAL | Status: DC | PRN
  Administered 2018-03-28 (×2): 650 mg via ORAL
  Filled 2018-03-28 (×2): qty 2

## 2018-03-28 MED ORDER — LEVOTHYROXINE SODIUM 100 MCG PO TABS: 100.0000 ug | ORAL_TABLET | Freq: Every day | ORAL | Status: DC

## 2018-03-28 NOTE — Progress Notes (Addendum)
Progress Note  Patient Name: Banyan Goodchild Date of Encounter: 03/28/2018  Consulting Cardiologist: New to [facility] - Dr. Diona Browner  Subjective   The patient reports having recurrent episodes of palpitations and chest discomfort upon returning home yesterday. Reports intermittent symptoms overnight and this morning. Is requesting to go outside to smoke a cigarette. Patient's two sons and daughter-in-law are at the bedside.   Inpatient Medications    Scheduled Meds: . [START ON 03/29/2018] aspirin EC  81 mg Oral Daily  . enoxaparin (LOVENOX) injection  40 mg Subcutaneous Q24H   Continuous Infusions: . diltiazem (CARDIZEM) infusion 15 mg/hr (03/28/18 0613)   PRN Meds: acetaminophen **OR** acetaminophen, ALPRAZolam, ondansetron (ZOFRAN) IV   Vital Signs    Vitals:   03/28/18 0630 03/28/18 0645 03/28/18 0700 03/28/18 0800  BP: (!) 130/97 (!) 132/98 (!) 125/97 (!) 143/93  Pulse: (!) 121 (!) 120 (!) 120 (!) 118  Resp: 16 12 16 18   Temp:      TempSrc:      SpO2: 95% 93% 91% 95%  Weight:      Height:        Intake/Output Summary (Last 24 hours) at 03/28/2018 0904 Last data filed at 03/28/2018 0804 Gross per 24 hour  Intake 100 ml  Output 1200 ml  Net -1100 ml   Filed Weights   03/28/18 0019  Weight: 220 lb (99.8 kg)    Telemetry    Wide-complex tachycardia, HR 133 with RBBB.  - Personally Reviewed  ECG    Episodes of NSR with HR in the 50's to 60's and frequent episodes of atrial tachycardia, HR into the 120's.  - Personally Reviewed  Physical Exam   General: Well developed, well nourished Caucasian male appearing in no acute distress. Head: Normocephalic, atraumatic.  Neck: Supple without bruits, JVD not elevated. Lungs:  Resp regular and unlabored, CTA without wheezing or rales. Heart: RRR, S1, S2, no S3, S4, or murmur; no rub. Abdomen: Soft, non-tender, non-distended with normoactive bowel sounds. No hepatomegaly. No rebound/guarding. No obvious abdominal  masses. Extremities: No clubbing, cyanosis, or lower extremity edema. Distal pedal pulses are 2+ bilaterally. Neuro: Alert and oriented X 3. Moves all extremities spontaneously. Psych: Normal affect.  Labs    Chemistry Recent Labs  Lab 03/26/18 1545 03/26/18 1550 03/28/18 0037  NA 135 140 138  K 3.5 3.7 3.5  CL 102 104 107  CO2 23  --  22  GLUCOSE 95 92 112*  BUN 9 8 13   CREATININE 1.24 1.30* 1.20  CALCIUM 9.0  --  9.2  PROT 7.2  --  7.0  ALBUMIN 3.8  --  3.7  AST 33  --  51*  ALT 43  --  52  ALKPHOS 40  --  44  BILITOT 0.5  --  0.2*  GFRNONAA >60  --  >60  GFRAA >60  --  >60  ANIONGAP 10  --  9     Hematology Recent Labs  Lab 03/26/18 1545 03/26/18 1550 03/28/18 0037  WBC 7.8  --  7.5  RBC 4.35  --  4.44  HGB 13.0 13.6 13.3  HCT 39.9 40.0 40.9  MCV 91.7  --  92.1  MCH 29.9  --  30.0  MCHC 32.6  --  32.5  RDW 14.6  --  14.6  PLT 171  --  174    Cardiac Enzymes Recent Labs  Lab 03/26/18 2158 03/27/18 0104 03/27/18 0416 03/28/18 0426  TROPONINI <0.03 <0.03 <0.03 <0.03  Recent Labs  Lab 03/26/18 1548 03/28/18 0044  TROPIPOC 0.00 0.01     Radiology    Dg Chest 2 View  Result Date: 03/28/2018 CLINICAL DATA:  Chest pain. EXAM: CHEST - 2 VIEW COMPARISON:  Radiographs 2 days prior FINDINGS: Upper normal heart size. Vascular congestion persists. Improved interstitial prominence from prior exam. Diminished right pleural effusion from prior. No confluent airspace disease. No pneumothorax. No acute osseous abnormality. IMPRESSION: Improved aeration from recent exam with mild residual vascular congestion. No new abnormality. Electronically Signed   By: Rubye Oaks M.D.   On: 03/28/2018 01:25   Dg Chest Portable 1 View  Result Date: 03/26/2018 CLINICAL DATA:  Irregular heartbeat. EXAM: PORTABLE CHEST 1 VIEW COMPARISON:  Chest x-ray report 02/28/1999. FINDINGS: Cardiomegaly. Mild pulmonary venous congestion bilateral interstitial prominence. Findings  suggest mild CHF. Tiny right-sided pleural effusion may be present. No pneumothorax. IMPRESSION: Cardiomegaly with mild pulmonary venous congestion, bilateral interstitial prominence, and small right pleural suggesting mild CHF. Electronically Signed   By: Maisie Fus  Register   On: 03/26/2018 15:58    Cardiac Studies   Echocardiogram: 03/27/2018: Study Conclusions  - Left ventricle: The cavity size was normal. Wall thickness was   increased in a pattern of mild LVH. The estimated ejection   fraction was 55%. Wall motion was normal; there were no regional   wall motion abnormalities. The study is not technically   sufficient to allow evaluation of LV diastolic function. - Aortic valve: Mildly calcified annulus. Trileaflet. - Mitral valve: Mildly calcified annulus. There was mild   regurgitation. - Right atrium: Central venous pressure (est): 8 mm Hg. - Atrial septum: No defect or patent foramen ovale was identified. - Tricuspid valve: There was trivial regurgitation. - Pulmonary arteries: Systolic pressure could not be accurately   estimated. - Pericardium, extracardiac: There was no pericardial effusion.  Patient Profile     62 y.o. male w/ PMH of HTN, Hypothyroidism, and tobacco use who was initially admitted from 4/9 - 03/27/2018 for evaluation of palpitations, found to have episodes of SVT (was consuming 5-11 cups of coffee daily) and prior episodes of VT noted on prior EKG tracings. Patient was insistent on discharge from the hospital, therefore he was started on BB therapy with outpatient EP evaluation arranged. He presented back to the ED later that evening for recurrent palpitations and episodes of chest discomfort.   Assessment & Plan    1. Paroxysmal SVT/ Episodes of VT - recently admitted for evaluation of palpitations and was noted to have episodes of pSVT on telemetry with prior EKG tracings concerning for episodes of VT. Initial plan was for outpatient EP evaluation but he  presented back with recurrent symptoms.  - labs show WBC 7.5, Hgb 13.3, K+ 3.5, and Mg 2.0. Keep K+ ~ 4.0 and Mg ~ 2.0.  Initial troponin negative. Recent echo showed a preserved EF of 55% with no regional WMA.  - currently on IV Cardizem with episodes of pSVT noted on telemetry with HR into the 120's at times. Would consider switching from Cardizem back to BB therapy. Discussed with the patient and his family transfer to [facility] for EP evaluation and further management of his arrhythmias (possible antiarrhythmic therapy versus EP Study) and they are in agreement with this. Will need further ischemic evaluation at some point as well given his wide-complex tachycardia. Card Master and admitting team made aware of the need for transfer.  2. HTN - BP stable at 116/73 - 160/119 since admission. Currently  on IV Cardizem at 15 mg/hr. PTA Lisinopril and Amlodipine currently held (patient had self-discontinued both medications prior to admission).   3. Hypothyroidism - TSH slightly elevated to 4.697 on 03/26/2018. Continue PTA Synthroid.  - per admitting team.  4. Tobacco Use - smokes between 15-20 cigarettes per day. Cessation was advised. Offered the patient a nicotine patch but he declines.   5. Nocturnal Respiratory Pauses - noted to have episodes of apnea while sleeping. Will need a sleep study as an outpatient.    For questions or updates, please contact [facility] Please consult [email or web address] for contact info under Cardiology/STEMI.   Lorri FrederickSigned, Brittany M Strader , PA-C 9:04 AM 03/28/2018 Pager: [phone]   Attending note:  Please refer to full consultation note from yesterday.  Patient re-presents to hospital after deciding to go home yesterday.  He continues to have palpitations and dizziness, no syncope.  Echocardiogram from yesterday indicated LVEF approximately 55% without regional wall motion abnormalities, also normal right ventricular contraction.  He was started on  beta-blocker with plan for further EP evaluation with Dr. Ladona Ridgelaylor as an outpatient.  ECGs were reviewed with Dr. Ladona Ridgelaylor yesterday.  Concern is that he has evidence of both PSVT and also paroxysmal VT.  He has a long-standing history of palpitations for at least 10 years, although they have been increasing in frequency of late.  He has not had frank syncope.  Plan at this time is to have [facility]  This will facilitate inpatient EP consultation.  I expect that he will need a full ischemic evaluation and likely EP study presuming he is willing to proceed and also comply with medical therapy and regular outpatient follow-up.  Jonelle SidleSamuel G. Felice Deem, M.D., F.A.C.C.

## 2018-03-28 NOTE — ED Notes (Signed)
Patient transported to X-ray 

## 2018-03-28 NOTE — Progress Notes (Signed)
[facility] to [facility]

## 2018-03-28 NOTE — H&P (Signed)
History and Physical    David NewcomerDonnie Mosley XBM:[id number] DOB: 09/12/1956 DOA: 03/28/2018  PCP: Jacquelin HawkingMcElroy, Shannon, PA-C   Patient coming from: Home.  I have personally briefly reviewed patient's old [facility]  Chief Complaint: Chest pain.  HPI: David Mosley is a 62 y.o. male with medical history significant of hypertension, hypothyroidism history of meningitis, history of pneumonia, tobacco use who was recently admitted and discharged from the hospital from 4/09 to 03/27/2018, into the emergency department the origin of his PCP who noticed that he was tachycardic during an office visit.  His rhythm showed multiple episodes of SVT as well as V. tach.  He was given adenosine in the ED on 0409 without significant results.  He was also given Lopressor, started on a Cardizem infusion and admitted to the hospital.  He was seen by cardiology, had a benign echocardiogram and discharged home yesterday with an appointment to see EP cardiologist (Dr. Ladona Ridgelaylor) in 2 weeks.    Tonight, the patient returns stating that when he got home he did not feel too well.  Later in the evening, while he was sitting watching TV he developed chest pain across the center of his ACW, he describes it as needle sticks.  He was associated with palpitations, dyspnea, lightheadedness, nausea and diaphoresis.  He also has developed an frontal headache.  He denies recent PND or orthopnea, but has frequent lower extremity edema.  Denies abdominal pain, emesis, diarrhea, melena or hematochezia.  No dysuria or frequency or hematuria.  Denies heat or cold intolerance.  No pole E aphasia, polydipsia or polyuria.  Denies itching or pruritus.  ED Course: Initial vital signs temperature 36.6C (97.8 F), pulse 133, respirations 18, blood pressure 160/1 90 mmHg and O2 sat 98% on room air.  He was given aspirin 324 mg p.o. x1, started on a Cardizem infusion, after being loaded with 10 mg IVP, in the emergency department.   He his heart rate has decreased from the 130s to the 60s now.  EKG show white QRS complex tachycardia at 133 bpm, RBBB and LPFB.  This is not significantly changed when compared to recent tracings.  I-STAT and serum troponin were normal.  CBC is normal.  CMP shows a glucose of 112 mg/dL and a mildly elevated AST at 51 units/L.  All other values are normal.  His portable chest radiograph showed cardiomegaly with mild pulmonary venous congestion, bilateral interstitial prominence and small right pleural suggesting mild CHF.  Review of Systems: As per HPI otherwise 10 point review of systems negative.   Past Medical History:  Diagnosis Date  . Hypertension   . Hypothyroidism   . Meningitis   . Pneumonia   . Tobacco use     Past Surgical History:  Procedure Laterality Date  . NO PAST SURGERIES       reports that he has been smoking cigarettes.  He has a 29.25 pack-year smoking history. He has never used smokeless tobacco. He reports that he does not drink alcohol or use drugs.  Allergies  Allergen Reactions  . Amlodipine Nausea Only, Anxiety and Palpitations  . Penicillins Itching    Has patient had a PCN reaction causing immediate rash, facial/tongue/throat swelling, SOB or lightheadedness with hypotension: No Has patient had a PCN reaction causing severe rash involving mucus membranes or skin necrosis: No Has patient had a PCN reaction that required hospitalization: No Has patient had a PCN reaction occurring within the  last 10 years: No\ If all of the above answers are "NO", then may proceed with Cephalosporin use.    Family History  Problem Relation Age of Onset  . Alzheimer's disease Mother   . Prostate cancer Father   . Arrhythmia Sister     Prior to Admission medications   Medication Sig Start Date End Date Taking? Authorizing Provider  levothyroxine (SYNTHROID, LEVOTHROID) 100 MCG tablet Take 1 tablet (100 mcg total) by mouth daily. 03/27/18  Yes Jacquelin Hawking, PA-C    lisinopril (PRINIVIL,ZESTRIL) 20 MG tablet Take 1 tablet (20 mg total) by mouth daily. 03/27/18  Yes Jacquelin Hawking, PA-C  metoprolol succinate (TOPROL-XL) 25 MG 24 hr tablet Take 1 tablet (25 mg total) by mouth daily. 03/28/18  Yes Shah, Pratik D, DO  amLODipine (NORVASC) 5 MG tablet Take 1 tablet (5 mg total) by mouth daily. Patient not taking: Reported on 03/26/2018 02/26/18   Jacquelin Hawking, PA-C  triamcinolone (KENALOG) 0.025 % ointment Apply 1 application topically 2 (two) times daily. 03/27/18   Jacquelin Hawking, PA-C    Physical Exam: Vitals:   03/28/18 0400 03/28/18 0430 03/28/18 0500 03/28/18 0600  BP: 116/85 121/73 132/81   Pulse: (!) 124 63 66   Resp: 17 15 13    Temp:    98.8 F (37.1 C)  TempSrc:      SpO2: 92% 91% 97%   Weight:      Height:        Constitutional: NAD, calm, comfortable Eyes: PERRL, lids and conjunctivae normal ENMT: Mucous membranes are moist. Posterior pharynx clear of any exudate or lesions.  Dentures present. Neck: normal, supple, no masses, no thyromegaly Respiratory: Decreased breath sounds on bases, clear to auscultation bilaterally, no wheezing, no crackles. Normal respiratory effort. No accessory muscle use.  Cardiovascular: Regular rate and rhythm, no murmurs / rubs / gallops. No extremity edema. 2+ pedal pulses. No carotid bruits.  Abdomen: Soft, no tenderness, no masses palpated. No hepatosplenomegaly. Bowel sounds positive.  Musculoskeletal: no clubbing / cyanosis.  Good ROM, no contractures. Normal muscle tone.  Skin: Positive hyperpigmented macules on forearms from long-term sunlight exposure damage. Neurologic: CN 2-12 grossly intact. Sensation intact, DTR normal. Strength 5/5 in all 4.  Psychiatric: Normal judgment and insight. Alert and oriented x 4. Normal mood.    Labs on Admission: I have personally reviewed following labs and imaging studies  CBC: Recent Labs  Lab 03/26/18 1545 03/26/18 1550 03/28/18 0037  WBC 7.8  --  7.5   NEUTROABS 4.5  --   --   HGB 13.0 13.6 13.3  HCT 39.9 40.0 40.9  MCV 91.7  --  92.1  PLT 171  --  174   Basic Metabolic Panel: Recent Labs  Lab 03/26/18 1545 03/26/18 1550 03/26/18 1937 03/27/18 0416 03/28/18 0037 03/28/18 0426  NA 135 140  --   --  138  --   K 3.5 3.7  --   --  3.5  --   CL 102 104  --   --  107  --   CO2 23  --   --   --  22  --   GLUCOSE 95 92  --   --  112*  --   BUN 9 8  --   --  13  --   CREATININE 1.24 1.30*  --   --  1.20  --   CALCIUM 9.0  --   --   --  9.2  --   MG  --   --  1.9 2.0  --  2.0  PHOS  --   --  3.5  --   --   --    GFR: Estimated Creatinine Clearance: 77.5 mL/min (by C-G formula based on SCr of 1.2 mg/dL). Liver Function Tests: Recent Labs  Lab 03/26/18 1545 03/28/18 0037  AST 33 51*  ALT 43 52  ALKPHOS 40 44  BILITOT 0.5 0.2*  PROT 7.2 7.0  ALBUMIN 3.8 3.7   No results for input(s): LIPASE, AMYLASE in the last 168 hours. No results for input(s): AMMONIA in the last 168 hours. Coagulation Profile: No results for input(s): INR, PROTIME in the last 168 hours. Cardiac Enzymes: Recent Labs  Lab 03/26/18 1937 03/26/18 2158 03/27/18 0104 03/27/18 0416 03/28/18 0426  TROPONINI <0.03 <0.03 <0.03 <0.03 <0.03   BNP (last 3 results) No results for input(s): PROBNP in the last 8760 hours. HbA1C: No results for input(s): HGBA1C in the last 72 hours. CBG: Recent Labs  Lab 03/28/18 0042  GLUCAP 96   Lipid Profile: No results for input(s): CHOL, HDL, LDLCALC, TRIG, CHOLHDL, LDLDIRECT in the last 72 hours. Thyroid Function Tests: Recent Labs    03/26/18 1615  TSH 4.697*   Anemia Panel: No results for input(s): VITAMINB12, FOLATE, FERRITIN, TIBC, IRON, RETICCTPCT in the last 72 hours. Urine analysis: No results found for: COLORURINE, APPEARANCEUR, LABSPEC, PHURINE, GLUCOSEU, HGBUR, BILIRUBINUR, KETONESUR, PROTEINUR, UROBILINOGEN, NITRITE, LEUKOCYTESUR  Radiological Exams on Admission: Dg Chest 2 View  Result Date:  03/28/2018 CLINICAL DATA:  Chest pain. EXAM: CHEST - 2 VIEW COMPARISON:  Radiographs 2 days prior FINDINGS: Upper normal heart size. Vascular congestion persists. Improved interstitial prominence from prior exam. Diminished right pleural effusion from prior. No confluent airspace disease. No pneumothorax. No acute osseous abnormality. IMPRESSION: Improved aeration from recent exam with mild residual vascular congestion. No new abnormality. Electronically Signed   By: Rubye Oaks M.D.   On: 03/28/2018 01:25   Dg Chest Portable 1 View  Result Date: 03/26/2018 CLINICAL DATA:  Irregular heartbeat. EXAM: PORTABLE CHEST 1 VIEW COMPARISON:  Chest x-ray report 02/28/1999. FINDINGS: Cardiomegaly. Mild pulmonary venous congestion bilateral interstitial prominence. Findings suggest mild CHF. Tiny right-sided pleural effusion may be present. No pneumothorax. IMPRESSION: Cardiomegaly with mild pulmonary venous congestion, bilateral interstitial prominence, and small right pleural suggesting mild CHF. Electronically Signed   By: Maisie Fus  Register   On: 03/26/2018 15:58   03/27/2018 echocardiogram ------------------------------------------------------------------- LV EF: 55%  ------------------------------------------------------------------- Indications:      Abnormal EKG 794.31.  ------------------------------------------------------------------- History:   PMH:  SVT, Edema.  Risk factors:  Current tobacco use. Hypertension.  ------------------------------------------------------------------- Study Conclusions  - Left ventricle: The cavity size was normal. Wall thickness was   increased in a pattern of mild LVH. The estimated ejection   fraction was 55%. Wall motion was normal; there were no regional   wall motion abnormalities. The study is not technically   sufficient to allow evaluation of LV diastolic function. - Aortic valve: Mildly calcified annulus. Trileaflet. - Mitral valve: Mildly  calcified annulus. There was mild   regurgitation. - Right atrium: Central venous pressure (est): 8 mm Hg. - Atrial septum: No defect or patent foramen ovale was identified. - Tricuspid valve: There was trivial regurgitation. - Pulmonary arteries: Systolic pressure could not be accurately   estimated. - Pericardium, extracardiac: There was no pericardial effusion.  EKG: Independently reviewed. Vent. 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  Assessment/Plan Principal Problem:   Wide-complex tachycardia (HCC) Heart rate now in the 60s or low 70s after Cardizem infusion started. Admit to stepdown/inpatient. Continue Cardizem infusion. Optimize potassium and supplement magnesium. Hold lisinopril and metoprolol while on Cardizem infusion. Amlodipine has been discontinued due to significant side effects per patient. Consult cardiology again this morning.  Active Problems:   Chest pain Continue cardiac telemetry. Trend troponin level. Aspirin was given earlier in the ER.    Essential hypertension, benign Per patient, he will not take amlodipine again. Currently on Cardizem infusion. Resume lisinopril and metoprolol, once of the diltiazem infusion. Monitor blood pressure, heart rate, electrolytes and renal function.    Hypothyroidism TSH done 2 days ago was 4.697 U/L. Continue levothyroxine 100 mcg p.o. daily.    Elevated AST (SGOT) We will follow-up to see if he starts trending up.    Tobacco use Declined nicotine replacement therapy. He was urged and encouraged to quit tobacco use.    Sleep apnea While sleeping in the ED, the staff and his son noticed respiratory pause of 20 seconds. I have advised the patient to follow-up with his PCP, so he can have a sleep study scheduled.  I have also  advised him to keep his oxygen on while in the hospital. Follow-up O2 sat while he is sleeping.  He may need to be discharged with an order for nocturnal oxygen.    DVT prophylaxis: Lovenox SQ. Code Status: Full code. Family Communication: His son was present in the ED room. Disposition Plan: Admit to stepdown to continue Cardizem infusion and reconsult cardiology. Consults called: Routine cardiology consult. Admission status: Inpatient/stepdown.   Bobette Mo MD [facility] Pager [phone].  If 7PM-7AM, please contact night-coverage [email or web address] Password [facility]  03/28/2018, 6:02 AM   This document was prepared using Dragon voice recognition software and may contain some unintended transcription errors.

## 2018-03-28 NOTE — Progress Notes (Signed)
Patient seen and evaluated this morning.  Reviewed admission H&P from earlier this morning by Dr. Robb Matarrtiz.  He returns to ED after discharge yesterday with recurrent palpitations as well as chest discomfort and was noted to have paroxysmal SVT as well as episodes of V. tach.  Due to recurrence of symptoms, cardiology has graciously reevaluated the patient and has [facility].  He remains currently on Cardizem drip at 15 mg/hr.  He is currently asymptomatic and otherwise hemodynamically stable.  Placed order for transfer to stepdown unit at [facility].

## 2018-03-28 NOTE — ED Provider Notes (Addendum)
[facility] EMERGENCY DEPARTMENT Provider Note   CSN: [id number] Arrival date & time: 03/28/18  0009     History   Chief Complaint Chief Complaint  Patient presents with  . Chest Pain    HPI Henderson NewcomerDonnie Bunker is a 62 y.o. male.  Patient presents to the emergency department for evaluation of chest pain and racing heartbeat.  Patient reports that he was in the hospital yesterday for same.  He reports that he has developed a pain across the center of his chest with a tingling at the top of his chest.  He has weakness, dizziness and nausea.  He feels like his heart is racing.  He was discharged to take Toprol-XL, but only took half a tablet because he reports that taking a full tablet makes him feel weak and short of breath.     Past Medical History:  Diagnosis Date  . Hypertension   . Hypothyroidism   . Meningitis   . Pneumonia     Patient Active Problem List   Diagnosis Date Noted  . Wide-complex tachycardia (HCC) 03/28/2018  . SVT (supraventricular tachycardia) (HCC) 03/26/2018  . Essential hypertension, benign 05/29/2016  . Thyroid activity decreased 03/21/2016  . Edema 03/21/2016  . PVD (peripheral vascular disease) (HCC) 03/21/2016  . Cigarette nicotine dependence with nicotine-induced disorder 03/21/2016    Past Surgical History:  Procedure Laterality Date  . NO PAST SURGERIES          Home Medications    Prior to Admission medications   Medication Sig Start Date End Date Taking? Authorizing Provider  levothyroxine (SYNTHROID, LEVOTHROID) 100 MCG tablet Take 1 tablet (100 mcg total) by mouth daily. 03/27/18  Yes Jacquelin HawkingMcElroy, Shannon, PA-C  lisinopril (PRINIVIL,ZESTRIL) 20 MG tablet Take 1 tablet (20 mg total) by mouth daily. 03/27/18  Yes Jacquelin HawkingMcElroy, Shannon, PA-C  metoprolol succinate (TOPROL-XL) 25 MG 24 hr tablet Take 1 tablet (25 mg total) by mouth daily. 03/28/18  Yes Shah, Pratik D, DO  amLODipine (NORVASC) 5 MG tablet Take 1 tablet (5 mg total) by mouth  daily. Patient not taking: Reported on 03/26/2018 02/26/18   Jacquelin HawkingMcElroy, Shannon, PA-C  triamcinolone (KENALOG) 0.025 % ointment Apply 1 application topically 2 (two) times daily. 03/27/18   Jacquelin HawkingMcElroy, Shannon, PA-C    Family History Family History  Problem Relation Age of Onset  . Alzheimer's disease Mother   . Prostate cancer Father   . Arrhythmia Sister     Social History Social History   Tobacco Use  . Smoking status: Current Every Day Smoker    Packs/day: 0.75    Years: 39.00    Pack years: 29.25    Types: Cigarettes  . Smokeless tobacco: Never Used  Substance Use Topics  . Alcohol use: No    Comment: previous heavy drinker. quit 2001  . Drug use: No    Types: Marijuana     Allergies   Penicillins   Review of Systems Review of Systems  Respiratory: Positive for shortness of breath.   Cardiovascular: Positive for chest pain and palpitations.  All other systems reviewed and are negative.    Physical Exam Updated Vital Signs BP 116/85   Pulse (!) 124   Temp 97.8 F (36.6 C) (Oral)   Resp 17   Ht 5\' 10"  (1.778 m)   Wt 99.8 kg (220 lb)   SpO2 92%   BMI 31.57 kg/m   Physical Exam  Constitutional: He is oriented to person, place, and time. He appears well-developed and well-nourished. No  distress.  HENT:  Head: Normocephalic and atraumatic.  Right Ear: Hearing normal.  Left Ear: Hearing normal.  Nose: Nose normal.  Mouth/Throat: Oropharynx is clear and moist and mucous membranes are normal.  Eyes: Pupils are equal, round, and reactive to light. Conjunctivae and EOM are normal.  Neck: Normal range of motion. Neck supple.  Cardiovascular: Regular rhythm, S1 normal and S2 normal. Tachycardia present. Exam reveals no gallop and no friction rub.  No murmur heard. Pulmonary/Chest: Effort normal and breath sounds normal. No respiratory distress. He exhibits no tenderness.  Abdominal: Soft. Normal appearance and bowel sounds are normal. There is no  hepatosplenomegaly. There is no tenderness. There is no rebound, no guarding, no tenderness at McBurney's point and negative Murphy's sign. No hernia.  Musculoskeletal: Normal range of motion.  Neurological: He is alert and oriented to person, place, and time. He has normal strength. No cranial nerve deficit or sensory deficit. Coordination normal. GCS eye subscore is 4. GCS verbal subscore is 5. GCS motor subscore is 6.  Skin: Skin is warm, dry and intact. No rash noted. No cyanosis.  Psychiatric: He has a normal mood and affect. His speech is normal and behavior is normal. Thought content normal.  Nursing note and vitals reviewed.    ED Treatments / Results  Labs (all labs ordered are listed, but only abnormal results are displayed) Labs Reviewed  COMPREHENSIVE METABOLIC PANEL - Abnormal; Notable for the following components:      Result Value   Glucose, Bld 112 (*)    AST 51 (*)    Total Bilirubin 0.2 (*)    All other components within normal limits  CBC  MAGNESIUM  TROPONIN I  TROPONIN I  CBG MONITORING, ED  I-STAT TROPONIN, ED  I-STAT TROPONIN, ED    EKG EKG Interpretation  Date/Time:  Thursday March 28 2018 00:18:58 EDT Ventricular Rate:  133 PR Interval:    QRS Duration: 136 QT Interval:  332 QTC Calculation: 494 R Axis:   111 Text Interpretation:  Wide QRS tachycardia 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 EXAM: CHEST - 2 VIEW COMPARISON:  Radiographs 2 days prior FINDINGS: Upper normal heart size. Vascular congestion persists. Improved interstitial prominence from prior exam. Diminished right pleural effusion from prior. No confluent airspace disease. No pneumothorax. No acute osseous abnormality. IMPRESSION: Improved aeration from recent exam with mild residual vascular congestion.  No new abnormality. Electronically Signed   By: Rubye Oaks M.D.   On: 03/28/2018 01:25   Dg Chest Portable 1 View  Result Date: 03/26/2018 CLINICAL DATA:  Irregular heartbeat. EXAM: PORTABLE CHEST 1 VIEW COMPARISON:  Chest x-ray report 02/28/1999. FINDINGS: Cardiomegaly. Mild pulmonary venous congestion bilateral interstitial prominence. Findings suggest mild CHF. Tiny right-sided pleural effusion may be present. No pneumothorax. IMPRESSION: Cardiomegaly with mild pulmonary venous congestion, bilateral interstitial prominence, and small right pleural suggesting mild CHF. Electronically Signed   By: Maisie Fus  Register   On: 03/26/2018 15:58    Procedures Procedures (including critical care time)  Medications Ordered in ED Medications  diltiazem (CARDIZEM) 1 mg/mL load via infusion 10 mg (10 mg Intravenous Bolus from Bag 03/28/18 0051)    And  diltiazem (CARDIZEM) 100 mg in dextrose 5% (1 mg/mL) infusion (15 mg/hr Intravenous Rate/Dose Change 03/28/18 0358)  magnesium sulfate IVPB 4 g 100 mL (  has no administration in time range)  potassium chloride SA (K-DUR,KLOR-CON) CR tablet 40 mEq (has no administration in time range)  acetaminophen (TYLENOL) tablet 650 mg (has no administration in time range)  ondansetron (ZOFRAN) injection 4 mg (has no administration in time range)  acetaminophen (TYLENOL) tablet 650 mg (has no administration in time range)    Or  acetaminophen (TYLENOL) suppository 650 mg (has no administration in time range)  enoxaparin (LOVENOX) injection 40 mg (has no administration in time range)  ALPRAZolam (XANAX) tablet 0.25 mg (has no administration in time range)  aspirin chewable tablet 324 mg (324 mg Oral Given 03/28/18 0050)  ondansetron (ZOFRAN) 4 MG/2ML injection (  Given 03/28/18 0150)     Initial Impression / Assessment and Plan / ED Course  I have reviewed the triage vital signs and the nursing notes.  Pertinent labs & imaging results that were available  during my care of the patient were reviewed by me and considered in my medical decision making (see chart for details).     Patient presents to the emergency department for evaluation of heart palpitations and chest pain.  Patient was hospitalized one day ago with similar symptoms.  He had what was felt to be SVT but also episodes of wide-complex tachycardia during his hospital stay.  Cardiology was on sure what the rhythm was, could not rule out V. tach.  Reading through the records, it appears that the patient insisted on leaving the hospital before workup was finished, therefore he was going to follow-up as an outpatient with EP.  Tonight he developed recurrent symptoms and came in with a heart rate in the 130s.  This was a wide-complex tachycardia.  No obvious ST segment changes.  This looks similar to the wide-complex tachycardia seen at previous visit.  This appears to be some type of aberrant conduction, I doubt it is V. tach at this time.  Because he did not respond to adenosine in the past, I opted to place him on Cardizem drip.  This did convert him into a sinus rhythm in the 60s, however, he intermittently goes back and forth between the normal sinus rhythm and a wide-complex tachycardia.  He is stable with both rhythms.  We will readmit to the hospital for further cardiology evaluation.  CRITICAL CARE Performed by: Gilda Crease   Total critical care time: 30 minutes  Critical care time was exclusive of separately billable procedures and treating other patients.  Critical care was necessary to treat or prevent imminent or life-threatening deterioration.  Critical care was time spent personally by me on the following activities: development of treatment plan with patient and/or surrogate as well as nursing, discussions with consultants, evaluation of patient's response to treatment, examination of patient, obtaining history from patient or surrogate, ordering and performing  treatments and interventions, ordering and review of laboratory studies, ordering and review of radiographic studies, pulse oximetry and re-evaluation of patient's condition.   Final Clinical Impressions(s) / ED Diagnoses   Final diagnoses:  Wide-complex tachycardia (HCC)  Chest pain, unspecified type    ED Discharge Orders    None       Gilda Crease, MD 03/28/18 1610    Gilda Crease, MD 05/07/18 [phone]

## 2018-03-28 NOTE — ED Triage Notes (Signed)
Pt arrived from home c/o chest pain. Pt was seen here on 03/26/18 for same and d/c home yesterday morning.

## 2018-03-29 DIAGNOSIS — Z720 Tobacco use: Secondary | ICD-10-CM

## 2018-03-29 DIAGNOSIS — I10 Essential (primary) hypertension: Secondary | ICD-10-CM

## 2018-03-29 MED ORDER — DILTIAZEM HCL ER COATED BEADS 180 MG PO CP24
300.0000 mg | ORAL_CAPSULE | Freq: Every day | ORAL | Status: DC
  Administered 2018-03-29: 300 mg via ORAL
  Filled 2018-03-29 (×2): qty 1

## 2018-03-29 MED ORDER — METOPROLOL SUCCINATE ER 25 MG PO TB24
25.0000 mg | ORAL_TABLET | Freq: Every day | ORAL | Status: DC
  Administered 2018-03-29: 25 mg via ORAL
  Filled 2018-03-29: qty 1

## 2018-03-29 NOTE — Progress Notes (Signed)
Patient ID: David Mosley, male   DOB: 09/06/1956, 62 y.o.   MRN: [id number]  PROGRESS NOTE    David Mosley  [id number] DOB: 08/27/1956 DOA: 03/28/2018 PCP: Jacquelin HawkingMcElroy, Shannon, PA-C   Brief Narrative:  62 year old male with history of hypertension, hypothyroidism, meningitis, pneumonia, tobacco use, recent diagnosis of SVT for which patient was admitted and discharged on 03/27/2018 for management hospital after cardiology evaluation presented on 03/28/1924 hospital with chest pain.  He was found to have wide-complex tachycardia and started on Cardizem drip.  Cardiology was consulted who [facility]  Assessment & Plan:   Principal Problem:   Wide-complex tachycardia (HCC) Active Problems:   Essential hypertension, benign   PSVT (paroxysmal supraventricular tachycardia) (HCC)   Hypothyroidism   Elevated AST (SGOT)   Chest pain   Tobacco use   Sleep apnea   Paroxysmal ventricular tachycardia (HCC)   Wide complex tachycardia -Currently on Cardizem drip.  Still having intermittent tachycardia with some chest pressure -Cardiology following.  Will follow recommendations  Chest pain -Probably secondary to above.  Cardiology following.  Troponins negative  Benign essential hypertension -Monitor blood pressure.  Continue Cardizem drip.  Lisinopril and metoprolol on hold.  Hypothyroidism -Continue levothyroxine  Tobacco abuse -Counseled about tobacco cessation.  Concern for sleep apnea -Outpatient follow-up with PCP regarding need for sleep study   DVT prophylaxis: Lovenox Code Status: Full Family Communication: Spoke to son at bedside Disposition Plan: Depends on clinical outcome  Consultants: Cardiology  Procedures: None  Antimicrobials: None   Subjective: Patient seen and examined at bedside.  He still has intermittent chest pressure.  No overnight fever, nausea or vomiting.  Objective: Vitals:   03/29/18 0020 03/29/18 0428 03/29/18 0820  03/29/18 1158  BP: 130/79 (!) 146/90 (!) 145/82 (!) 161/92  Pulse: 62 64 67 (!) 126  Resp: 18 17 17 19   Temp: 98.1 F (36.7 C) 97.8 F (36.6 C)    TempSrc: Oral Oral    SpO2: 92% 96% 97% 99%  Weight:  94.8 kg (209 lb)    Height:        Intake/Output Summary (Last 24 hours) at 03/29/2018 1351 Last data filed at 03/29/2018 1221 Gross per 24 hour  Intake 743.5 ml  Output 1400 ml  Net -656.5 ml   Filed Weights   03/28/18 0019 03/28/18 1856 03/29/18 0428  Weight: 99.8 kg (220 lb) 95.3 kg (210 lb) 94.8 kg (209 lb)    Examination:  General exam: Appears calm and comfortable  Respiratory system: Bilateral decreased breath sound at bases Cardiovascular system: S1 & S2 heard, intermittent tachycardia  gastrointestinal system: Abdomen is nondistended, soft and nontender. Normal bowel sounds heard. Extremities: No cyanosis, clubbing, edema    Data Reviewed: I have personally reviewed following labs and imaging studies  CBC: Recent Labs  Lab 03/26/18 1545 03/26/18 1550 03/28/18 0037  WBC 7.8  --  7.5  NEUTROABS 4.5  --   --   HGB 13.0 13.6 13.3  HCT 39.9 40.0 40.9  MCV 91.7  --  92.1  PLT 171  --  174   Basic Metabolic Panel: Recent Labs  Lab 03/26/18 1545 03/26/18 1550 03/26/18 1937 03/27/18 0416 03/28/18 0037 03/28/18 0426 03/28/18 1034  NA 135 140  --   --  138  --  137  K 3.5 3.7  --   --  3.5  --  4.0  CL 102 104  --   --  107  --  105  CO2  23  --   --   --  22  --  24  GLUCOSE 95 92  --   --  112*  --  121*  BUN 9 8  --   --  13  --  12  CREATININE 1.24 1.30*  --   --  1.20  --  1.18  CALCIUM 9.0  --   --   --  9.2  --  8.6*  MG  --   --  1.9 2.0  --  2.0  --   PHOS  --   --  3.5  --   --   --   --    GFR: Estimated Creatinine Clearance: 76.9 mL/min (by C-G formula based on SCr of 1.18 mg/dL). Liver Function Tests: Recent Labs  Lab 03/26/18 1545 03/28/18 0037 03/28/18 1034  AST 33 51* 26  ALT 43 52 43  ALKPHOS 40 44 38  BILITOT 0.5 0.2* 0.5    PROT 7.2 7.0 6.5  ALBUMIN 3.8 3.7 3.3*   No results for input(s): LIPASE, AMYLASE in the last 168 hours. No results for input(s): AMMONIA in the last 168 hours. Coagulation Profile: No results for input(s): INR, PROTIME in the last 168 hours. Cardiac Enzymes: Recent Labs  Lab 03/26/18 2158 03/27/18 0104 03/27/18 0416 03/28/18 0426 03/28/18 1034  TROPONINI <0.03 <0.03 <0.03 <0.03 <0.03   BNP (last 3 results) No results for input(s): PROBNP in the last 8760 hours. HbA1C: No results for input(s): HGBA1C in the last 72 hours. CBG: Recent Labs  Lab 03/28/18 0042  GLUCAP 96   Lipid Profile: No results for input(s): CHOL, HDL, LDLCALC, TRIG, CHOLHDL, LDLDIRECT in the last 72 hours. Thyroid Function Tests: Recent Labs    03/26/18 1615  TSH 4.697*   Anemia Panel: No results for input(s): VITAMINB12, FOLATE, FERRITIN, TIBC, IRON, RETICCTPCT in the last 72 hours. Sepsis Labs: No results for input(s): PROCALCITON, LATICACIDVEN in the last 168 hours.  Recent Results (from the past 240 hour(s))  MRSA PCR Screening     Status: None   Collection Time: 03/26/18  8:39 PM  Result Value Ref Range Status   MRSA by PCR NEGATIVE NEGATIVE Final    Comment:        The GeneXpert MRSA Assay (FDA approved for NASAL specimens only), is one component of a comprehensive MRSA colonization surveillance program. It is not intended to diagnose MRSA infection nor to guide or monitor treatment for MRSA infections. Performed at [facility], [address] [place], [place] [address]          Radiology Studies: Dg Chest 2 View  Result Date: 03/28/2018 CLINICAL DATA:  Chest pain. EXAM: CHEST - 2 VIEW COMPARISON:  Radiographs 2 days prior FINDINGS: Upper normal heart size. Vascular congestion persists. Improved interstitial prominence from prior exam. Diminished right pleural effusion from prior. No confluent airspace disease. No pneumothorax. No acute osseous abnormality. IMPRESSION:  Improved aeration from recent exam with mild residual vascular congestion. No new abnormality. Electronically Signed   By: Rubye Oaks M.D.   On: 03/28/2018 01:25        Scheduled Meds: . aspirin EC  81 mg Oral Daily  . enoxaparin (LOVENOX) injection  40 mg Subcutaneous Q24H  . levothyroxine  100 mcg Oral QAC breakfast   Continuous Infusions: . diltiazem (CARDIZEM) infusion 15 mg/hr (03/29/18 0745)     LOS: 1 day        Glade Lloyd, MD [facility] Pager [phone]  If 7PM-7AM, please contact night-coverage [email or web address] Password [email or web address] 03/29/2018, 1:51 PM

## 2018-03-29 NOTE — Plan of Care (Signed)
Pt is not experiencing complications

## 2018-03-29 NOTE — Consult Note (Addendum)
Cardiology Consultation:   Patient ID: David Mosley; [id number]; 12-13-1956   Admit date: 03/28/2018 Date of Consult: 03/29/2018  Primary Care Provider: Jacquelin Hawking, PA-C Primary Cardiologist: None, new to [facility] this hospitalization    Patient Profile:   David Mosley is a 62 y.o. male with a hx of HTN, hypothyroidism, + smoker (nearly 1ppd), SVT,  who is being seen today for the evaluation of tachycardia at the request of Dr. Mariah Milling.  History of Present Illness:   David Mosley was seen at his PMD office for f/u on BP with c/o dizziness, he was noted to be tachycardic recommended EMS to [facility] though patient had sister take him.  Initial treatment in ER was adenosine and IV lopressor with intermittent SR/SVT >> diltiazem gtt with better maintenance of SR though had some self limited episodes of tachycardia as well.  Cardiology was consulted pt reported years of brief palpitations.  Patient reported on gong smoker about 1ppd and drinking 5-11 cups of coffee daily, sounds like intermittent compliance with his BP meds 2/2 "side effects"  I find reports of edema with Norvasc.    The patient was insistant about going home, Dilt gtt was changed to PO Toprol and discharged only to return the later in the evening with recurrent palpitations and reports of CP.  Revisit with cardiology yesterday, recommended transfer to [facility] for EP evaluation given WCT suspect to be VT and bursts of SVT despite meds.  Dr. Diona Browner notes nocturnal pauses and apnea episodes on monitor recommends sleep eval, and eventual ischemic w/u.  The patient reports that even to his childhood he hash had brief palpations, in his adulthood probably daily but very short lasting.  In the last 3 years he has noticed when he gets them they seem to last longer, and in the last year or so more frequently.  He denies to me any kind of CP with with or without the tachycardia.  No SOB, no dizziness, near syncope or syncope.  He has noticed with  the longer palpitations he feels tired, and thinks they are starting to take a toll on him.  LABS K+ 3.5 >> 4.0 BUN/Creat 9/1.24 >> 24/1.18 LFTs wnl WBC 7.8 H/H 13/39 Plts 171 TSH 4.697 Trop I: <0.03 x6 in total  Current rate limiting meds: Dilt gtt @ 32ml/hr   Past Medical History:  Diagnosis Date  . Hypertension   . Hypothyroidism   . Meningitis   . Pneumonia   . Tobacco use     Past Surgical History:  Procedure Laterality Date  . NO PAST SURGERIES        Inpatient Medications: Scheduled Meds: . aspirin EC  81 mg Oral Daily  . enoxaparin (LOVENOX) injection  40 mg Subcutaneous Q24H  . levothyroxine  100 mcg Oral QAC breakfast   Continuous Infusions: . diltiazem (CARDIZEM) infusion 15 mg/hr (03/29/18 0745)   PRN Meds: acetaminophen **OR** acetaminophen, ALPRAZolam, metoprolol tartrate, ondansetron (ZOFRAN) IV  Allergies:    Allergies  Allergen Reactions  . Amlodipine Nausea Only, Anxiety and Palpitations  . Penicillins Itching    Has patient had a PCN reaction causing immediate rash, facial/tongue/throat swelling, SOB or lightheadedness with hypotension: No Has patient had a PCN reaction causing severe rash involving mucus membranes or skin necrosis: No Has patient had a PCN reaction that required hospitalization: No Has patient had a PCN reaction occurring within the last 10 years: No\ If all of the above answers are "NO", then may proceed with Cephalosporin use.  Social History:   Social History   Socioeconomic History  . Marital status: Divorced    Spouse name: Not on file  . Number of children: Not on file  . Years of education: Not on file  . Highest education level: Not on file  Occupational History  . Not on file  Social Needs  . Financial resource strain: Not on file  . Food insecurity:    Worry: Not on file    Inability: Not on file  . Transportation needs:    Medical: Not on file    Non-medical: Not on file  Tobacco Use  .  Smoking status: Current Every Day Smoker    Packs/day: 0.75    Years: 39.00    Pack years: 29.25    Types: Cigarettes  . Smokeless tobacco: Never Used  Substance and Sexual Activity  . Alcohol use: No    Comment: previous heavy drinker. quit 2001  . Drug use: No    Types: Marijuana  . Sexual activity: Not on file  Lifestyle  . Physical activity:    Days per week: Not on file    Minutes per session: Not on file  . Stress: Not on file  Relationships  . Social connections:    Talks on phone: Not on file    Gets together: Not on file    Attends religious service: Not on file    Active member of club or organization: Not on file    Attends meetings of clubs or organizations: Not on file    Relationship status: Not on file  . Intimate partner violence:    Fear of current or ex partner: Not on file    Emotionally abused: Not on file    Physically abused: Not on file    Forced sexual activity: Not on file  Other Topics Concern  . Not on file  Social History Narrative  . Not on file    Family History:   Family History  Problem Relation Age of Onset  . Alzheimer's disease Mother   . Prostate cancer Father   . Arrhythmia Sister      ROS:  Please see the history of present illness.  All other ROS reviewed and negative.     Physical Exam/Data:   Vitals:   03/28/18 1953 03/29/18 0020 03/29/18 0428 03/29/18 0820  BP: (!) 145/83 130/79 (!) 146/90 (!) 145/82  Pulse: 64 62 64 67  Resp: 14 18 17 17   Temp: 97.7 F (36.5 C) 98.1 F (36.7 C) 97.8 F (36.6 C)   TempSrc: Axillary Oral Oral   SpO2: 96% 92% 96% 97%  Weight:   209 lb (94.8 kg)   Height:        Intake/Output Summary (Last 24 hours) at 03/29/2018 1051 Last data filed at 03/29/2018 0745 Gross per 24 hour  Intake 383.5 ml  Output 650 ml  Net -266.5 ml   Filed Weights   03/28/18 0019 03/28/18 1856 03/29/18 0428  Weight: 220 lb (99.8 kg) 210 lb (95.3 kg) 209 lb (94.8 kg)   Body mass index is 29.99 kg/m.    General:  Well nourished, well developed, in no acute distress, looks older then his age HEENT: normal Lymph: no adenopathy Neck: no JVD Endocrine:  No thryomegaly Vascular: No carotid bruits Cardiac:   RRR, tachycardic; no murmurs, gallops or rubs Lungs:  CTA b/l, no wheezing, rhonchi or rales  Abd: soft, nontender  Ext: no edema Musculoskeletal:  No deformities Skin: warm and  dry  Neuro:  No gross focal abnormalities noted Psych:  Normal affect   EKG:  The EKG was personally reviewed and demonstrates:   03/26/18: WCT 141bpm, RBBB 03/26/18: bigeminal patterned rhythm narrow complex w/RBBB wide complexes, 140bpm 03/26/18: SR 62bpm, PR , QRS 96ms, QTc 03/28/18: WCT 133bpm, RBBB  03/01/17: SR 64, 1st degeree AVblock, PR , QRS 90ms Telemetry:  Telemetry was personally reviewed and demonstrates:   SR with intermittent tachycardia throughout his stay so far  Relevant CV Studies:  Echocardiogram: 03/27/2018: Study Conclusions - Left ventricle: The cavity size was normal. Wall thickness was increased in a pattern of mild LVH. The estimated ejection fraction was 55%. Wall motion was normal; there were no regional wall motion abnormalities. The study is not technically sufficient to allow evaluation of LV diastolic function. - Aortic valve: Mildly calcified annulus. Trileaflet. - Mitral valve: Mildly calcified annulus. There was mild regurgitation. - Right atrium: Central venous pressure (est): 8 mm Hg. - Atrial septum: No defect or patent foramen ovale was identified. - Tricuspid valve: There was trivial regurgitation. - Pulmonary arteries: Systolic pressure could not be accurately estimated. - Pericardium, extracardiac: There was no pericardial effusion.    Echocardiogram: 04/26/2016 Study Conclusions - Left ventricle: The cavity size was normal. Wall thickness was increased in a pattern of moderate LVH. Systolic function was normal. The estimated  ejection fraction was in the range of 55% to 60%. Wall motion was normal; there were no regional wall motion abnormalities. Left ventricular diastolic function parameters were normal. - Aortic valve: Mildly calcified annulus. Trileaflet; mildly thickened leaflets. Valve area (VTI): 2.84 cm^2. Valve area (Vmax): 2.87 cm^2. - Mitral valve: Mildly calcified annulus. Normal thickness leaflets    Laboratory Data:  Chemistry Recent Labs  Lab 03/26/18 1545 03/26/18 1550 03/28/18 0037 03/28/18 1034  NA 135 140 138 137  K 3.5 3.7 3.5 4.0  CL 102 104 107 105  CO2 23  --  22 24  GLUCOSE 95 92 112* 121*  BUN 9 8 13 12   CREATININE 1.24 1.30* 1.20 1.18  CALCIUM 9.0  --  9.2 8.6*  GFRNONAA >60  --  >60 >60  GFRAA >60  --  >60 >60  ANIONGAP 10  --  9 8    Recent Labs  Lab 03/26/18 1545 03/28/18 0037 03/28/18 1034  PROT 7.2 7.0 6.5  ALBUMIN 3.8 3.7 3.3*  AST 33 51* 26  ALT 43 52 43  ALKPHOS 40 44 38  BILITOT 0.5 0.2* 0.5   Hematology Recent Labs  Lab 03/26/18 1545 03/26/18 1550 03/28/18 0037  WBC 7.8  --  7.5  RBC 4.35  --  4.44  HGB 13.0 13.6 13.3  HCT 39.9 40.0 40.9  MCV 91.7  --  92.1  MCH 29.9  --  30.0  MCHC 32.6  --  32.5  RDW 14.6  --  14.6  PLT 171  --  174   Cardiac Enzymes Recent Labs  Lab 03/26/18 1937 03/26/18 2158 03/27/18 0104 03/27/18 0416 03/28/18 0426 03/28/18 1034  TROPONINI <0.03 <0.03 <0.03 <0.03 <0.03 <0.03    Recent Labs  Lab 03/26/18 1548 03/28/18 0044  TROPIPOC 0.00 0.01    BNPNo results for input(s): BNP, PROBNP in the last 168 hours.  DDimer No results for input(s): DDIMER in the last 168 hours.  Radiology/Studies:  Dg Chest 2 View Result Date: 03/28/2018 CLINICAL DATA:  Chest pain. EXAM: CHEST - 2 VIEW COMPARISON:  Radiographs 2 days prior FINDINGS: Upper normal  heart size. Vascular congestion persists. Improved interstitial prominence from prior exam. Diminished right pleural effusion from prior. No confluent  airspace disease. No pneumothorax. No acute osseous abnormality. IMPRESSION: Improved aeration from recent exam with mild residual vascular congestion. No new abnormality. Electronically Signed   By: Rubye Oaks M.D.   On: 03/28/2018 01:25   Dg Chest Portable 1 View Result Date: 03/26/2018 CLINICAL DATA:  Irregular heartbeat. EXAM: PORTABLE CHEST 1 VIEW COMPARISON:  Chest x-ray report 02/28/1999. FINDINGS: Cardiomegaly. Mild pulmonary venous congestion bilateral interstitial prominence. Findings suggest mild CHF. Tiny right-sided pleural effusion may be present. No pneumothorax. IMPRESSION: Cardiomegaly with mild pulmonary venous congestion, bilateral interstitial prominence, and small right pleural suggesting mild CHF. Electronically Signed   By: Maisie Fus  Register   On: 03/26/2018 15:58    Assessment and Plan:   1. WCT In review of Dr. Windell Hummingbird note, reviewed with Dr. Ladona Ridgel yesterday, SVT.  There were concerns for VT also (I see only SVT and SVT with aberrancy).      He was in/out of tachycardia while we spoke, he is aware of the palpitations, though acutely not symptomatic otherwise     He says he feels more tired generally, maybe a little lightheaded with the palpitations the last few days      discussed at length with the patient: 1ppd/smoker (hx of 2+ ppd), all his adult life, counseled, he mentions he loves to smoke, knows he needs to quit, but "honestly I want to go right now for one" Excessive coffee He has hx of 18beers/day though none at all for 20 years Denies sleep apnea, does not believe what he was told in [facility] that he likely has  Reports that he did not tolerat beta blocker previously due to feeling "jittery". I will hold off ono BB right now andwill review case/tracings with Dr. Johney Frame ? EP study/timing He will see later today     For questions or updates, please contact [facility] Please consult [email or web address] for contact info under Cardiology/STEMI.    Signed, Sheilah Pigeon, PA-C  03/29/2018 10:51 AM   I have seen, examined the patient, and reviewed the above assessment and plan.  Changes to above are made where necessary.  On exam, RRR.   I have reviewed multiple ekgs and rhythm strips.  Each episode starts with an ectopic atrial beat and appears to be ectopic atrial tachycardia with first degree AV block.  There are also episodes of aberrant conduction of this atach.  I do not see any VT. As he has not tolerated beta blockers previously, I will transition IV diltiazem to oral diltiazem at this time. Consider addition of flecainide if atach persists. We also discussed ablation as an option though he would prefer to try medical therapy first.  EP to follow while here. Smoking cessation advised.  Co Sign: Hillis Range, MD 03/29/2018 3:34 PM

## 2018-03-29 NOTE — Progress Notes (Signed)
Pt. Sustaining HR above 130 for about 1 hour. Pt was symptomatic, diaphoresis, chest pain and SOB. EKG obtained, O2 applied for comfort MD Turner notified awaiting reply. PRN Lopressor administered BP stable. Will continue to monitor.

## 2018-03-29 NOTE — Plan of Care (Signed)
  Problem: Activity: Goal: Risk for activity intolerance will decrease Outcome: Progressing   

## 2018-03-30 DIAGNOSIS — R079 Chest pain, unspecified: Secondary | ICD-10-CM

## 2018-03-30 LAB — CBC WITH DIFFERENTIAL/PLATELET
BASOS ABS: 0.1 10*3/uL (ref 0.0–0.1)
BASOS PCT: 1 %
EOS PCT: 11 %
Eosinophils Absolute: 1 10*3/uL — ABNORMAL HIGH (ref 0.0–0.7)
HEMATOCRIT: 45.2 % (ref 39.0–52.0)
Hemoglobin: 14.9 g/dL (ref 13.0–17.0)
Lymphocytes Relative: 27 %
Lymphs Abs: 2.3 10*3/uL (ref 0.7–4.0)
MCH: 30.1 pg (ref 26.0–34.0)
MCHC: 33 g/dL (ref 30.0–36.0)
MCV: 91.3 fL (ref 78.0–100.0)
MONO ABS: 0.9 10*3/uL (ref 0.1–1.0)
MONOS PCT: 11 %
NEUTROS ABS: 4.4 10*3/uL (ref 1.7–7.7)
Neutrophils Relative %: 50 %
PLATELETS: 191 10*3/uL (ref 150–400)
RBC: 4.95 MIL/uL (ref 4.22–5.81)
RDW: 14.6 % (ref 11.5–15.5)
WBC: 8.6 10*3/uL (ref 4.0–10.5)

## 2018-03-30 LAB — BASIC METABOLIC PANEL
ANION GAP: 8 (ref 5–15)
BUN: 18 mg/dL (ref 6–20)
CALCIUM: 9.1 mg/dL (ref 8.9–10.3)
CO2: 25 mmol/L (ref 22–32)
CREATININE: 1.32 mg/dL — AB (ref 0.61–1.24)
Chloride: 104 mmol/L (ref 101–111)
GFR, EST NON AFRICAN AMERICAN: 57 mL/min — AB (ref >60)
GLUCOSE: 102 mg/dL — AB (ref 65–99)
Potassium: 4.3 mmol/L (ref 3.5–5.1)
Sodium: 137 mmol/L (ref 135–145)

## 2018-03-30 LAB — MAGNESIUM: Magnesium: 2 mg/dL (ref 1.7–2.4)

## 2018-03-30 MED ORDER — DILTIAZEM HCL ER COATED BEADS 180 MG PO CP24
300.0000 mg | ORAL_CAPSULE | Freq: Every day | ORAL | Status: DC
  Administered 2018-03-30: 18:00:00 300 mg via ORAL
  Filled 2018-03-30 (×2): qty 1

## 2018-03-30 MED ORDER — LISINOPRIL 20 MG PO TABS
20.0000 mg | ORAL_TABLET | Freq: Every day | ORAL | Status: DC
  Administered 2018-03-30: 20 mg via ORAL
  Filled 2018-03-30: qty 1

## 2018-03-30 MED ORDER — FLECAINIDE ACETATE 50 MG PO TABS
50.0000 mg | ORAL_TABLET | Freq: Two times a day (BID) | ORAL | Status: DC
  Administered 2018-03-30 – 2018-03-31 (×3): 50 mg via ORAL
  Filled 2018-03-30 (×4): qty 1

## 2018-03-30 NOTE — Plan of Care (Signed)
  Problem: Safety: Goal: Ability to remain free from injury will improve Outcome: Progressing   

## 2018-03-30 NOTE — Plan of Care (Signed)
SVT converted to NSR after 1st dose of Flecainide. EKG. Cardiac Monitoring.

## 2018-03-30 NOTE — Progress Notes (Addendum)
Progress Note   Subjective   Continues to have incessant SVT.  + rare SOB and chest heaviness.  No new concerns  Inpatient Medications    Scheduled Meds: . aspirin EC  81 mg Oral Daily  . diltiazem  300 mg Oral Daily  . enoxaparin (LOVENOX) injection  40 mg Subcutaneous Q24H  . flecainide  50 mg Oral Q12H  . levothyroxine  100 mcg Oral QAC breakfast   Continuous Infusions:  PRN Meds: acetaminophen **OR** acetaminophen, ALPRAZolam, ondansetron (ZOFRAN) IV   Vital Signs    Vitals:   03/30/18 0015 03/30/18 0417 03/30/18 0545 03/30/18 0743  BP: (!) 127/99 (!) 127/99 122/85 (!) 119/103  Pulse: (!) 127 (!) 127  (!) 127  Resp: 17 19 15    Temp: 97.9 F (36.6 C) 98.4 F (36.9 C)  97.8 F (36.6 C)  TempSrc: Oral Oral  Oral  SpO2:    99%  Weight:  205 lb 4.8 oz (93.1 kg)    Height:        Intake/Output Summary (Last 24 hours) at 03/30/2018 0927 Last data filed at 03/30/2018 0420 Gross per 24 hour  Intake 881.5 ml  Output 3240 ml  Net -2358.5 ml   Filed Weights   03/28/18 1856 03/29/18 0428 03/30/18 0417  Weight: 210 lb (95.3 kg) 209 lb (94.8 kg) 205 lb 4.8 oz (93.1 kg)    Telemetry    SVT ongoing - Personally Reviewed  Physical Exam   GEN- The patient is well appearing, alert and oriented x 3 today.   Head- normocephalic, atraumatic Eyes-  Sclera clear, conjunctiva pink Ears- hearing intact Oropharynx- clear Neck- supple, Lungs- Clear to ausculation bilaterally, normal work of breathing Heart- tachycardic regular rhythm  GI- soft, NT, ND, + BS Extremities- no clubbing, cyanosis, or edema  MS- no significant deformity or atrophy Skin- no rash or lesion Psych- euthymic mood, full affect Neuro- strength and sensation are intact   Labs    Chemistry Recent Labs  Lab 03/26/18 1545  03/28/18 0037 03/28/18 1034 03/30/18 0405  NA 135   < > 138 137 137  K 3.5   < > 3.5 4.0 4.3  CL 102   < > 107 105 104  CO2 23  --  22 24 25   GLUCOSE 95   < > 112* 121*  102*  BUN 9   < > 13 12 18   CREATININE 1.24   < > 1.20 1.18 1.32*  CALCIUM 9.0  --  9.2 8.6* 9.1  PROT 7.2  --  7.0 6.5  --   ALBUMIN 3.8  --  3.7 3.3*  --   AST 33  --  51* 26  --   ALT 43  --  52 43  --   ALKPHOS 40  --  44 38  --   BILITOT 0.5  --  0.2* 0.5  --   GFRNONAA >60  --  >60 >60 57*  GFRAA >60  --  >60 >60 >60  ANIONGAP 10  --  9 8 8    < > = values in this interval not displayed.     Hematology Recent Labs  Lab 03/26/18 1545 03/26/18 1550 03/28/18 0037 03/30/18 0405  WBC 7.8  --  7.5 8.6  RBC 4.35  --  4.44 4.95  HGB 13.0 13.6 13.3 14.9  HCT 39.9 40.0 40.9 45.2  MCV 91.7  --  92.1 91.3  MCH 29.9  --  30.0 30.1  MCHC 32.6  --  32.5 33.0  RDW 14.6  --  14.6 14.6  PLT 171  --  174 191    Cardiac Enzymes Recent Labs  Lab 03/27/18 0104 03/27/18 0416 03/28/18 0426 03/28/18 1034  TROPONINI <0.03 <0.03 <0.03 <0.03    Recent Labs  Lab 03/26/18 1548 03/28/18 0044  TROPIPOC 0.00 0.01        Assessment & Plan    1.  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  Cannot completely exclude atypical AVNRT, though I think this is less likely. Has not responded to cardizem.  Previously did not tolerate metoprolol. Will start flecainide 50mg  BID today and titrate as needed May ultimately require ablation.  2. Tobacco Cessation is advised  3. HTN Stable No change required today  4. Atypical chest pain Will reassess in sinus.  If symptoms persist, could consider eventual myoview  As he has no insurance, he is worried about costs of hospitalization and also states "I dont know how Im going to be able to afford these medicines at home".  Will consult case management.  Hillis Range MD, [facility] 03/30/2018 9:27 AM

## 2018-03-30 NOTE — Progress Notes (Signed)
Patient ID: David Mosley Basham, male   DOB: 03/06/1956, 62 y.o.   MRN: [id number]  PROGRESS NOTE    David Mosley Face  [id number] DOB: 08/10/1956 DOA: 03/28/2018 PCP: Jacquelin HawkingMcElroy, Shannon, PA-C   Brief Narrative:  62 year old male with history of hypertension, hypothyroidism, meningitis, pneumonia, tobacco use, recent diagnosis of SVT for which patient was admitted and discharged on 03/27/2018 for management hospital after cardiology evaluation presented on 03/28/1924 hospital with chest pain.  He was found to have wide-complex tachycardia and started on Cardizem drip.  Cardiology was consulted who recommended [facility] to [facility].  Assessment & Plan:   Principal Problem:   Wide-complex tachycardia (HCC) Active Problems:   Essential hypertension, benign   PSVT (paroxysmal supraventricular tachycardia) (HCC)   Hypothyroidism   Elevated AST (SGOT)   Chest pain   Tobacco use   Sleep apnea   Paroxysmal ventricular tachycardia (HCC)   Wide complex tachycardia -Off Cardizem drip.  On oral Cardizem.  Cardiology has started flecainide as well today.  Still having intermittent tachycardia with some chest pressure  Chest pain -Probably secondary to above.  Cardiology following.  Troponins negative  Benign essential hypertension -Monitor blood pressure.  Continue oral Cardizem.  Lisinopril and metoprolol on hold.  Hypothyroidism -Continue levothyroxine  Tobacco abuse -Counseled about tobacco cessation.  Concern for sleep apnea -Outpatient follow-up with PCP regarding need for sleep study   DVT prophylaxis: Lovenox Code Status: Full Family Communication: None at bedside Disposition Plan: Depends on clinical outcome  Consultants: Cardiology  Procedures: None  Antimicrobials: None   Subjective: Patient seen and examined at bedside.  No overnight fever, vomiting.  Intermittently still having chest pressure with diaphoresis. Objective: Vitals:   03/30/18 0015 03/30/18 0417 03/30/18  0545 03/30/18 0743  BP: (!) 127/99 (!) 127/99 122/85 (!) 119/103  Pulse: (!) 127 (!) 127  (!) 127  Resp: 17 19 15    Temp: 97.9 F (36.6 C) 98.4 F (36.9 C)  97.8 F (36.6 C)  TempSrc: Oral Oral  Oral  SpO2:    99%  Weight:  93.1 kg (205 lb 4.8 oz)    Height:        Intake/Output Summary (Last 24 hours) at 03/30/2018 1115 Last data filed at 03/30/2018 0900 Gross per 24 hour  Intake 1121.5 ml  Output 3240 ml  Net -2118.5 ml   Filed Weights   03/28/18 1856 03/29/18 0428 03/30/18 0417  Weight: 95.3 kg (210 lb) 94.8 kg (209 lb) 93.1 kg (205 lb 4.8 oz)    Examination:  General exam: Appears calm and comfortable.  No distress Respiratory system: Bilateral decreased breath sounds at bases Cardiovascular system: S1 & S2 heard, still has intermittent tachycardia  gastrointestinal system: Abdomen is nondistended, soft and nontender. Normal bowel sounds heard. Extremities: No cyanosis; no edema    Data Reviewed: I have personally reviewed following labs and imaging studies  CBC: Recent Labs  Lab 03/26/18 1545 03/26/18 1550 03/28/18 0037 03/30/18 0405  WBC 7.8  --  7.5 8.6  NEUTROABS 4.5  --   --  4.4  HGB 13.0 13.6 13.3 14.9  HCT 39.9 40.0 40.9 45.2  MCV 91.7  --  92.1 91.3  PLT 171  --  174 191   Basic Metabolic Panel: Recent Labs  Lab 03/26/18 1545 03/26/18 1550 03/26/18 1937 03/27/18 0416 03/28/18 0037 03/28/18 0426 03/28/18 1034 03/30/18 0405  NA 135 140  --   --  138  --  137 137  K 3.5 3.7  --   --  3.5  --  4.0 4.3  CL 102 104  --   --  107  --  105 104  CO2 23  --   --   --  22  --  24 25  GLUCOSE 95 92  --   --  112*  --  121* 102*  BUN 9 8  --   --  13  --  12 18  CREATININE 1.24 1.30*  --   --  1.20  --  1.18 1.32*  CALCIUM 9.0  --   --   --  9.2  --  8.6* 9.1  MG  --   --  1.9 2.0  --  2.0  --  2.0  PHOS  --   --  3.5  --   --   --   --   --    GFR: Estimated Creatinine Clearance: 68.2 mL/min (A) (by C-G formula based on SCr of 1.32 mg/dL  (H)). Liver Function Tests: Recent Labs  Lab 03/26/18 1545 03/28/18 0037 03/28/18 1034  AST 33 51* 26  ALT 43 52 43  ALKPHOS 40 44 38  BILITOT 0.5 0.2* 0.5  PROT 7.2 7.0 6.5  ALBUMIN 3.8 3.7 3.3*   No results for input(s): LIPASE, AMYLASE in the last 168 hours. No results for input(s): AMMONIA in the last 168 hours. Coagulation Profile: No results for input(s): INR, PROTIME in the last 168 hours. Cardiac Enzymes: Recent Labs  Lab 03/26/18 2158 03/27/18 0104 03/27/18 0416 03/28/18 0426 03/28/18 1034  TROPONINI <0.03 <0.03 <0.03 <0.03 <0.03   BNP (last 3 results) No results for input(s): PROBNP in the last 8760 hours. HbA1C: No results for input(s): HGBA1C in the last 72 hours. CBG: Recent Labs  Lab 03/28/18 0042  GLUCAP 96   Lipid Profile: No results for input(s): CHOL, HDL, LDLCALC, TRIG, CHOLHDL, LDLDIRECT in the last 72 hours. Thyroid Function Tests: No results for input(s): TSH, T4TOTAL, FREET4, T3FREE, THYROIDAB in the last 72 hours. Anemia Panel: No results for input(s): VITAMINB12, FOLATE, FERRITIN, TIBC, IRON, RETICCTPCT in the last 72 hours. Sepsis Labs: No results for input(s): PROCALCITON, LATICACIDVEN in the last 168 hours.  Recent Results (from the past 240 hour(s))  MRSA PCR Screening     Status: None   Collection Time: 03/26/18  8:39 PM  Result Value Ref Range Status   MRSA by PCR NEGATIVE NEGATIVE Final    Comment:        The GeneXpert MRSA Assay (FDA approved for NASAL specimens only), is one component of a comprehensive MRSA colonization surveillance program. It is not intended to diagnose MRSA infection nor to guide or monitor treatment for MRSA infections. Performed at [facility], [address], [place], [place] [address]          Radiology Studies: No results found.      Scheduled Meds: . aspirin EC  81 mg Oral Daily  . diltiazem  300 mg Oral Daily  . enoxaparin (LOVENOX) injection  40 mg Subcutaneous Q24H  .  flecainide  50 mg Oral Q12H  . levothyroxine  100 mcg Oral QAC breakfast   Continuous Infusions:    LOS: 2 days        Glade Lloyd, MD [facility] Pager [phone]  If 7PM-7AM, please contact night-coverage [email or web address] Password [email or web address] 03/30/2018, 11:15 AM

## 2018-03-31 DIAGNOSIS — R0782 Intercostal pain: Secondary | ICD-10-CM

## 2018-03-31 DIAGNOSIS — E039 Hypothyroidism, unspecified: Secondary | ICD-10-CM

## 2018-03-31 LAB — BASIC METABOLIC PANEL
Anion gap: 8 (ref 5–15)
BUN: 23 mg/dL — AB (ref 6–20)
CHLORIDE: 104 mmol/L (ref 101–111)
CO2: 23 mmol/L (ref 22–32)
Calcium: 8.8 mg/dL — ABNORMAL LOW (ref 8.9–10.3)
Creatinine, Ser: 1.42 mg/dL — ABNORMAL HIGH (ref 0.61–1.24)
GFR calc Af Amer: >60 mL/min (ref >60)
GFR calc non Af Amer: 52 mL/min — ABNORMAL LOW (ref >60)
Glucose, Bld: 133 mg/dL — ABNORMAL HIGH (ref 65–99)
POTASSIUM: 4.3 mmol/L (ref 3.5–5.1)
Sodium: 135 mmol/L (ref 135–145)

## 2018-03-31 LAB — MAGNESIUM: MAGNESIUM: 1.9 mg/dL (ref 1.7–2.4)

## 2018-03-31 MED ORDER — FLECAINIDE ACETATE 50 MG PO TABS: 50.0000 mg | ORAL_TABLET | Freq: Two times a day (BID) | ORAL | 0 refills | Status: DC

## 2018-03-31 MED ORDER — DILTIAZEM HCL ER COATED BEADS 180 MG PO CP24: 180.0000 mg | ORAL_CAPSULE | Freq: Every day | ORAL | 0 refills | Status: DC

## 2018-03-31 MED ORDER — DILTIAZEM HCL ER COATED BEADS 180 MG PO CP24: 180.0000 mg | ORAL_CAPSULE | Freq: Every day | ORAL | Status: DC

## 2018-03-31 NOTE — Care Management Note (Signed)
Case Management Note  Patient Details  Name: David Mosley MRN: [id number] Date of Birth: 06/14/1956  Subjective/Objective:                 Patient provided with MATCH letter to cover weekend DC. He goes to Clinic in [place] where he usually gets his medications. No other CM needs.   Action/Plan:   Expected Discharge Date:  03/31/18               Expected Discharge Plan:  Home/Self Care  In-House Referral:     Discharge planning Services  [facility]  Post Acute Care Choice:    Choice offered to:     DME Arranged:    DME Agency:     HH Arranged:    HH Agency:     Status of Service:  Completed, signed off  If discussed at [employer] Meetings, dates discussed:    Additional Comments:  Lawerance SabalDebbie Jabrea Kallstrom, RN 03/31/2018, 11:35 AM

## 2018-03-31 NOTE — Progress Notes (Signed)
Progress Note   Subjective   Doing well today, the patient denies CP or SOB.  SVT is improved.  Wants to go home. No new concerns  Inpatient Medications    Scheduled Meds: . aspirin EC  81 mg Oral Daily  . diltiazem  180 mg Oral Daily  . enoxaparin (LOVENOX) injection  40 mg Subcutaneous Q24H  . flecainide  50 mg Oral Q12H  . levothyroxine  100 mcg Oral QAC breakfast   Continuous Infusions:  PRN Meds: acetaminophen **OR** acetaminophen, ALPRAZolam, ondansetron (ZOFRAN) IV   Vital Signs    Vitals:   03/30/18 1958 03/31/18 0340 03/31/18 0343 03/31/18 0746  BP: 127/85  138/78 (!) 145/79  Pulse: 71  74 82  Resp: 15  19 13   Temp: 98 F (36.7 C)  97.8 F (36.6 C) 98.1 F (36.7 C)  TempSrc: Oral  Oral Oral  SpO2: 97%  97% 98%  Weight:  209 lb 11.2 oz (95.1 kg)    Height:        Intake/Output Summary (Last 24 hours) at 03/31/2018 [id number] Last data filed at 03/31/2018 0900 Gross per 24 hour  Intake 720 ml  Output 1500 ml  Net -780 ml   Filed Weights   03/29/18 0428 03/30/18 0417 03/31/18 0340  Weight: 209 lb (94.8 kg) 205 lb 4.8 oz (93.1 kg) 209 lb 11.2 oz (95.1 kg)    Telemetry    SVT nearly resolved after 2 doses of flecainide,  "VF episode" on tele is artifact - Personally Reviewed  Physical Exam   GEN- The patient is well appearing, alert and oriented x 3 today.   Head- normocephalic, atraumatic Eyes-  Sclera clear, conjunctiva pink Ears- hearing intact Oropharynx- clear Neck- supple, Lungs- Clear to ausculation bilaterally, normal work of breathing Heart- Regular rate and rhythm  GI- soft, NT, ND, + BS Extremities- no clubbing, cyanosis, or edema  MS- no significant deformity or atrophy Skin- no rash or lesion Psych- euthymic mood, full affect Neuro- strength and sensation are intact   Labs    Chemistry Recent Labs  Lab 03/26/18 1545  03/28/18 0037 03/28/18 1034 03/30/18 0405 03/31/18 0337  NA 135   < > 138 137 137 135  K 3.5   < > 3.5 4.0  4.3 4.3  CL 102   < > 107 105 104 104  CO2 23  --  22 24 25 23   GLUCOSE 95   < > 112* 121* 102* 133*  BUN 9   < > 13 12 18  23*  CREATININE 1.24   < > 1.20 1.18 1.32* 1.42*  CALCIUM 9.0  --  9.2 8.6* 9.1 8.8*  PROT 7.2  --  7.0 6.5  --   --   ALBUMIN 3.8  --  3.7 3.3*  --   --   AST 33  --  51* 26  --   --   ALT 43  --  52 43  --   --   ALKPHOS 40  --  44 38  --   --   BILITOT 0.5  --  0.2* 0.5  --   --   GFRNONAA >60  --  >60 >60 57* 52*  GFRAA >60  --  >60 >60 >60 >60  ANIONGAP 10  --  9 8 8 8    < > = values in this interval not displayed.     Hematology Recent Labs  Lab 03/26/18 1545 03/26/18 1550 03/28/18 0037 03/30/18 0405  WBC 7.8  --  7.5 8.6  RBC 4.35  --  4.44 4.95  HGB 13.0 13.6 13.3 14.9  HCT 39.9 40.0 40.9 45.2  MCV 91.7  --  92.1 91.3  MCH 29.9  --  30.0 30.1  MCHC 32.6  --  32.5 33.0  RDW 14.6  --  14.6 14.6  PLT 171  --  174 191    Cardiac Enzymes Recent Labs  Lab 03/27/18 0104 03/27/18 0416 03/28/18 0426 03/28/18 1034  TROPONINI <0.03 <0.03 <0.03 <0.03    Recent Labs  Lab 03/26/18 1548 03/28/18 0044  TROPIPOC 0.00 0.01        Assessment & Plan    1.  SVT Well controlled now with flecainide 50mg  BID Reduce diltiazem CD to 180mg  daily No further inpatient workup required  2. Tobacco Cessation advised  3. HTN Stable No change required today  4. Atypical chest pain Resolved Consider outpatient myoview when he sees Dr Ladona Ridgel in 2 weeks  He has scheduled follow-up with Dr Ladona Ridgel.  OK to discharge from my standpoint.  Return to work on Wednesday.  Hillis Range MD, [facility] 03/31/2018 9:22 AM

## 2018-03-31 NOTE — Discharge Summary (Signed)
Physician Discharge Summary  Artez Regis ZOX:[id number] DOB: 1956/05/14 DOA: 03/28/2018  PCP: Jacquelin Hawking, PA-C  Admit date: 03/28/2018 Discharge date: 03/31/2018  Admitted From: Home Disposition: Home  Recommendations for Outpatient Follow-up:  1. Follow up with PCP in 1 week 2. Follow-up with cardiology/Dr. Ladona Ridgel in 1-2 weeks  Home Health: No Equipment/Devices: None  Discharge Condition: Stable CODE STATUS: Full Diet recommendation: Heart Healthy  Brief/Interim Summary: 62 year old male with history of hypertension, hypothyroidism, meningitis, pneumonia, tobacco use, recent diagnosis of SVT for which patient was admitted and discharged on 03/27/2018 for management hospital after cardiology evaluation presented on 03/28/1924 hospital with chest pain.  He was found to have wide-complex tachycardia and started on Cardizem drip.  Cardiology was consulted who recommended transfer to [facility].  Patient was evaluated by electrophysiology, Cardizem drip was changed to oral Cardizem.  He was subsequently started on flecainide on 03/30/2018.  His rate is much better controlled.  Patient wants to go home.  Cardiology has cleared the patient for discharge with outpatient follow-up with cardiology.   Discharge Diagnoses:  Principal Problem:   Wide-complex tachycardia (HCC) Active Problems:   Essential hypertension, benign   PSVT (paroxysmal supraventricular tachycardia) (HCC)   Hypothyroidism   Elevated AST (SGOT)   Chest pain   Tobacco use   Sleep apnea   Paroxysmal ventricular tachycardia (HCC)  PSVT -Off Cardizem drip.  On oral Cardizem.  Cardiology has started flecainide as well today.  -Heart rate much improved. -Patient wants to go home.  Cardiology has cleared the patient for discharge with outpatient follow-up with cardiology.  May need outpatient Myoview test when he follows up with Dr. Ladona Ridgel as an outpatient.  Chest pain -Probably secondary to above.  Resolved.    Out patient cardiology follow-up..  Troponins negative  Benign essential hypertension -Monitor blood pressure.  Continue oral Cardizem.  Lisinopril and metoprolol on hold.  Outpatient follow-up  Hypothyroidism -Continue levothyroxine  Tobacco abuse -Counseled about tobacco cessation.  Concern for sleep apnea -Outpatient follow-up with PCP regarding need for sleep study    Discharge Instructions  Discharge Instructions    Ambulatory referral to Cardiology   Complete by:  As directed    Follow up for Tachycardia   Call MD for:  difficulty breathing, headache or visual disturbances   Complete by:  As directed    Call MD for:  extreme fatigue   Complete by:  As directed    Call MD for:  hives   Complete by:  As directed    Call MD for:  persistant dizziness or light-headedness   Complete by:  As directed    Call MD for:  persistant nausea and vomiting   Complete by:  As directed    Call MD for:  severe uncontrolled pain   Complete by:  As directed    Call MD for:  temperature >100.4   Complete by:  As directed    Diet - low sodium heart healthy   Complete by:  As directed    Increase activity slowly   Complete by:  As directed      Allergies as of 03/31/2018      Reactions   Amlodipine Nausea Only, Anxiety, Palpitations   Penicillins Itching   Has patient had a PCN reaction causing immediate rash, facial/tongue/throat swelling, SOB or lightheadedness with hypotension: No Has patient had a PCN reaction causing severe rash involving mucus membranes or skin necrosis: No Has patient had a PCN reaction that required hospitalization: No  Has patient had a PCN reaction occurring within the last 10 years: No\ If all of the above answers are "NO", then may proceed with Cephalosporin use.      Medication List    STOP taking these medications   lisinopril 20 MG tablet Commonly known as:  PRINIVIL,ZESTRIL   metoprolol succinate 25 MG 24 hr tablet Commonly known as:   TOPROL-XL     TAKE these medications   diltiazem 180 MG 24 hr capsule Commonly known as:  CARDIZEM CD Take 1 capsule (180 mg total) by mouth daily.   flecainide 50 MG tablet Commonly known as:  TAMBOCOR Take 1 tablet (50 mg total) by mouth every 12 (twelve) hours.   levothyroxine 100 MCG tablet Commonly known as:  SYNTHROID, LEVOTHROID Take 1 tablet (100 mcg total) by mouth daily.   triamcinolone 0.025 % ointment Commonly known as:  KENALOG Apply 1 application topically 2 (two) times daily.      Follow-up Information    Jacquelin Hawking, PA-C. Schedule an appointment as soon as possible for a visit in 1 week(s).   Specialty:  Physician Assistant Contact information: [address] [place] [address] [phone]        Marinus Maw, MD. Schedule an appointment as soon as possible for a visit in 1 week(s).   Specialty:  Cardiology Contact information: [address] [place] [address] [phone]          Allergies  Allergen Reactions  . Amlodipine Nausea Only, Anxiety and Palpitations  . Penicillins Itching    Has patient had a PCN reaction causing immediate rash, facial/tongue/throat swelling, SOB or lightheadedness with hypotension: No Has patient had a PCN reaction causing severe rash involving mucus membranes or skin necrosis: No Has patient had a PCN reaction that required hospitalization: No Has patient had a PCN reaction occurring within the last 10 years: No\ If all of the above answers are "NO", then may proceed with Cephalosporin use.    Consultations:  Cardiology   Procedures/Studies: Dg Chest 2 View  Result Date: 03/28/2018 CLINICAL DATA:  Chest pain. EXAM: CHEST - 2 VIEW COMPARISON:  Radiographs 2 days prior FINDINGS: Upper normal heart size. Vascular congestion persists. Improved interstitial prominence from prior exam. Diminished right pleural effusion from prior. No confluent airspace disease. No pneumothorax. No acute osseous  abnormality. IMPRESSION: Improved aeration from recent exam with mild residual vascular congestion. No new abnormality. Electronically Signed   By: Rubye Oaks M.D.   On: 03/28/2018 01:25   Dg Chest Portable 1 View  Result Date: 03/26/2018 CLINICAL DATA:  Irregular heartbeat. EXAM: PORTABLE CHEST 1 VIEW COMPARISON:  Chest x-ray report 02/28/1999. FINDINGS: Cardiomegaly. Mild pulmonary venous congestion bilateral interstitial prominence. Findings suggest mild CHF. Tiny right-sided pleural effusion may be present. No pneumothorax. IMPRESSION: Cardiomegaly with mild pulmonary venous congestion, bilateral interstitial prominence, and small right pleural suggesting mild CHF. Electronically Signed   By: Maisie Fus  Register   On: 03/26/2018 15:58     Subjective: Patient seen and examined at bedside.  He wants to go home.  He feels better.  No overnight fever, nausea, vomiting, chest pain.  Discharge Exam: Vitals:   03/31/18 0343 03/31/18 0746  BP: 138/78 (!) 145/79  Pulse: 74 82  Resp: 19 13  Temp: 97.8 F (36.6 C) 98.1 F (36.7 C)  SpO2: 97% 98%   Vitals:   03/30/18 1958 03/31/18 0340 03/31/18 0343 03/31/18 0746  BP: 127/85  138/78 (!) 145/79  Pulse: 71  74 82  Resp: 15  19 13   Temp: 98 F (36.7 C)  97.8 F (36.6 C) 98.1 F (36.7 C)  TempSrc: Oral  Oral Oral  SpO2: 97%  97% 98%  Weight:  95.1 kg (209 lb 11.2 oz)    Height:        General: Pt is alert, awake, not in acute distress Cardiovascular: Rate controlled, S1/S2 + Respiratory: Bilateral decreased breath sounds at bases Abdominal: Soft, NT, ND, bowel sounds + Extremities: no edema, no cyanosis    The results of significant diagnostics from this hospitalization (including imaging, microbiology, ancillary and laboratory) are listed below for reference.     Microbiology: Recent Results (from the past 240 hour(s))  MRSA PCR Screening     Status: None   Collection Time: 03/26/18  8:39 PM  Result Value Ref Range Status    MRSA by PCR NEGATIVE NEGATIVE Final    Comment:        The GeneXpert MRSA Assay (FDA approved for NASAL specimens only), is one component of a comprehensive MRSA colonization surveillance program. It is not intended to diagnose MRSA infection nor to guide or monitor treatment for MRSA infections. Performed at [facility], [address], [place], [place] [address]      Labs: BNP (last 3 results) No results for input(s): BNP in the last 8760 hours. Basic Metabolic Panel: Recent Labs  Lab 03/26/18 1545 03/26/18 1550 03/26/18 1937 03/27/18 0416 03/28/18 0037 03/28/18 0426 03/28/18 1034 03/30/18 0405 03/31/18 0337  NA 135 140  --   --  138  --  137 137 135  K 3.5 3.7  --   --  3.5  --  4.0 4.3 4.3  CL 102 104  --   --  107  --  105 104 104  CO2 23  --   --   --  22  --  24 25 23   GLUCOSE 95 92  --   --  112*  --  121* 102* 133*  BUN 9 8  --   --  13  --  12 18 23*  CREATININE 1.24 1.30*  --   --  1.20  --  1.18 1.32* 1.42*  CALCIUM 9.0  --   --   --  9.2  --  8.6* 9.1 8.8*  MG  --   --  1.9 2.0  --  2.0  --  2.0 1.9  PHOS  --   --  3.5  --   --   --   --   --   --    Liver Function Tests: Recent Labs  Lab 03/26/18 1545 03/28/18 0037 03/28/18 1034  AST 33 51* 26  ALT 43 52 43  ALKPHOS 40 44 38  BILITOT 0.5 0.2* 0.5  PROT 7.2 7.0 6.5  ALBUMIN 3.8 3.7 3.3*   No results for input(s): LIPASE, AMYLASE in the last 168 hours. No results for input(s): AMMONIA in the last 168 hours. CBC: Recent Labs  Lab 03/26/18 1545 03/26/18 1550 03/28/18 0037 03/30/18 0405  WBC 7.8  --  7.5 8.6  NEUTROABS 4.5  --   --  4.4  HGB 13.0 13.6 13.3 14.9  HCT 39.9 40.0 40.9 45.2  MCV 91.7  --  92.1 91.3  PLT 171  --  174 191   Cardiac Enzymes: Recent Labs  Lab 03/26/18 2158 03/27/18 0104 03/27/18 0416 03/28/18 0426 03/28/18 1034  TROPONINI <0.03 <0.03 <0.03 <0.03 <0.03   BNP:  Invalid input(s): POCBNP CBG: Recent Labs  Lab 03/28/18 0042  GLUCAP 96   D-Dimer No  results for input(s): DDIMER in the last 72 hours. Hgb A1c No results for input(s): HGBA1C in the last 72 hours. Lipid Profile No results for input(s): CHOL, HDL, LDLCALC, TRIG, CHOLHDL, LDLDIRECT in the last 72 hours. Thyroid function studies No results for input(s): TSH, T4TOTAL, T3FREE, THYROIDAB in the last 72 hours.  Invalid input(s): FREET3 Anemia work up No results for input(s): VITAMINB12, FOLATE, FERRITIN, TIBC, IRON, RETICCTPCT in the last 72 hours. Urinalysis No results found for: COLORURINE, APPEARANCEUR, LABSPEC, PHURINE, GLUCOSEU, HGBUR, BILIRUBINUR, KETONESUR, PROTEINUR, UROBILINOGEN, NITRITE, LEUKOCYTESUR Sepsis Labs Invalid input(s): PROCALCITONIN,  WBC,  LACTICIDVEN Microbiology Recent Results (from the past 240 hour(s))  MRSA PCR Screening     Status: None   Collection Time: 03/26/18  8:39 PM  Result Value Ref Range Status   MRSA by PCR NEGATIVE NEGATIVE Final    Comment:        The GeneXpert MRSA Assay (FDA approved for NASAL specimens only), is one component of a comprehensive MRSA colonization surveillance program. It is not intended to diagnose MRSA infection nor to guide or monitor treatment for MRSA infections. Performed at [facility], [address] [place], [place] [id number]      Time coordinating discharge: 35 minutes  SIGNED:   Glade LloydKshitiz Tajah Noguchi, MD  [facility] 03/31/2018, 10:38 AM Pager: [phone]  If 7PM-7AM, please contact night-coverage [email or web address] Password [email or web address]

## 2018-04-10 ENCOUNTER — Ambulatory Visit: Payer: Self-pay | Admitting: Student

## 2018-04-11 ENCOUNTER — Ambulatory Visit: Payer: Self-pay | Admitting: Internal Medicine

## 2018-04-11 ENCOUNTER — Ambulatory Visit (INDEPENDENT_AMBULATORY_CARE_PROVIDER_SITE_OTHER): Payer: Self-pay | Admitting: Internal Medicine

## 2018-04-11 ENCOUNTER — Encounter: Payer: Self-pay | Admitting: Internal Medicine

## 2018-04-11 VITALS — BP 136/94 | HR 82 | Ht 70.0 in | Wt 210.0 lb

## 2018-04-11 DIAGNOSIS — I472 Ventricular tachycardia, unspecified: Secondary | ICD-10-CM

## 2018-04-11 NOTE — Patient Instructions (Signed)
Medication Instructions:  Your physician recommends that you continue on your current medications as directed. Please refer to the Current Medication list given to you today.   Labwork: None   Testing/Procedures: Dates for SVT Ablation : 7/15, 7/16, 7/18, 7/29, 8/2   Follow-Up: Your physician recommends that you schedule a follow-up appointment with Dr. Ladona Ridgelaylor after ablation.    Any Other Special Instructions Will Be Listed Below (If Applicable).     If you need a refill on your cardiac medications before your next appointment, please call your pharmacy.  Thank you for choosing [facility]!

## 2018-04-12 NOTE — Progress Notes (Signed)
HPI [name] is referred today by Dr. Diona Browner for evaluation of SVT. The patient has felt palpitations for years and never had syncope. He does drink caffeine in excess. The episodes start and stop suddenly. Interestingly he often has a changing QRS during his tachycardia. He has been placed on flecainide and a beta blocker and his spells have improved.  Allergies  Allergen Reactions  . Amlodipine Nausea Only, Anxiety and Palpitations  . Penicillins Itching    Has patient had a PCN reaction causing immediate rash, facial/tongue/throat swelling, SOB or lightheadedness with hypotension: No Has patient had a PCN reaction causing severe rash involving mucus membranes or skin necrosis: No Has patient had a PCN reaction that required hospitalization: No Has patient had a PCN reaction occurring within the last 10 years: No\ If all of the above answers are "NO", then may proceed with Cephalosporin use.     Current Outpatient Medications  Medication Sig Dispense Refill  . diltiazem (CARDIZEM CD) 180 MG 24 hr capsule Take 1 capsule (180 mg total) by mouth daily. 30 capsule 0  . flecainide (TAMBOCOR) 50 MG tablet Take 1 tablet (50 mg total) by mouth every 12 (twelve) hours. 60 tablet 0  . levothyroxine (SYNTHROID, LEVOTHROID) 100 MCG tablet Take 1 tablet (100 mcg total) by mouth daily. 30 tablet 1  . triamcinolone (KENALOG) 0.025 % ointment Apply 1 application topically 2 (two) times daily. 30 g 0   No current facility-administered medications for this visit.      Past Medical History:  Diagnosis Date  . Hypertension   . Hypothyroidism   . Meningitis   . Pneumonia   . Tobacco use     ROS:   All systems reviewed and negative except as noted in the HPI.   Past Surgical History:  Procedure Laterality Date  . NO PAST SURGERIES       Family History  Problem Relation Age of Onset  . Alzheimer's disease Mother   . Prostate cancer Father   . Arrhythmia Sister      Social  History   Socioeconomic History  . Marital status: Divorced    Spouse name: Not on file  . Number of children: Not on file  . Years of education: Not on file  . Highest education level: Not on file  Occupational History  . Not on file  Social Needs  . Financial resource strain: Not on file  . Food insecurity:    Worry: Not on file    Inability: Not on file  . Transportation needs:    Medical: Not on file    Non-medical: Not on file  Tobacco Use  . Smoking status: Current Every Day Smoker    Packs/day: 0.75    Years: 39.00    Pack years: 29.25    Types: Cigarettes  . Smokeless tobacco: Never Used  Substance and Sexual Activity  . Alcohol use: No    Comment: previous heavy drinker. quit 2001  . Drug use: No    Types: Marijuana  . Sexual activity: Not on file  Lifestyle  . Physical activity:    Days per week: Not on file    Minutes per session: Not on file  . Stress: Not on file  Relationships  . Social connections:    Talks on phone: Not on file    Gets together: Not on file    Attends religious service: Not on file    Active member of club or organization:  Not on file    Attends meetings of clubs or organizations: Not on file    Relationship status: Not on file  . Intimate partner violence:    Fear of current or ex partner: Not on file    Emotionally abused: Not on file    Physically abused: Not on file    Forced sexual activity: Not on file  Other Topics Concern  . Not on file  Social History Narrative  . Not on file     BP (!) 136/94   Pulse 82   Ht 5\' 10"  (1.778 m)   Wt 210 lb (95.3 kg)   SpO2 94%   BMI 30.13 kg/m   Physical Exam:  Well appearing NAD HEENT: Unremarkable Neck:  No JVD, no thyromegally Lymphatics:  No adenopathy Back:  No CVA tenderness Lungs:  Clear HEART:  Regular rate rhythm, no murmurs, no rubs, no clicks Abd:  soft, positive bowel sounds, no organomegally, no rebound, no guarding Ext:  2 plus pulses, no edema, no  cyanosis, no clubbing Skin:  No rashes no nodules Neuro:  CN II through XII intact, motor grossly intact  ECG - reviewed strips   Assess/Plan: 1. Wide QRS tachycardia - his ECG's have been reviewed. One shows VA dissociation which would strongly suggest VT as the mechanism of his arrhythmia. I have offered him catheter ablation or additional medical therapy and he is considering his options. I would also note that at times the QRS is narrow during his tachycardia. He could although quite rare have VT and SVT at the same time. He will continue his medical therapy and will call us if he wishes to proceed with ablation. I would like to use CARTO if so. 2. Tobacco abuse - he is encouraged to stop smoking. He says that he is smoking less than a ppd down from 3 ppd. 3. HTN - his blood pressure is still on the high side and he may need additional beta blocker.  Leonia ReevesGregg Taylor,M.D.

## 2018-04-29 ENCOUNTER — Encounter: Payer: Self-pay | Admitting: Physician Assistant

## 2018-04-29 ENCOUNTER — Other Ambulatory Visit: Payer: Self-pay

## 2018-04-29 ENCOUNTER — Ambulatory Visit: Payer: Self-pay | Admitting: Physician Assistant

## 2018-04-29 VITALS — BP 142/82 | HR 82 | Temp 97.5°F | Ht 68.0 in | Wt 206.5 lb

## 2018-04-29 DIAGNOSIS — E039 Hypothyroidism, unspecified: Secondary | ICD-10-CM

## 2018-04-29 DIAGNOSIS — R000 Tachycardia, unspecified: Secondary | ICD-10-CM

## 2018-04-29 DIAGNOSIS — I472 Ventricular tachycardia: Secondary | ICD-10-CM

## 2018-04-29 DIAGNOSIS — F17210 Nicotine dependence, cigarettes, uncomplicated: Secondary | ICD-10-CM

## 2018-04-29 DIAGNOSIS — I10 Essential (primary) hypertension: Secondary | ICD-10-CM

## 2018-04-29 MED ORDER — METOPROLOL TARTRATE 25 MG PO TABS: 25.0000 mg | ORAL_TABLET | Freq: Two times a day (BID) | ORAL | 1 refills | Status: DC

## 2018-04-29 MED ORDER — DILTIAZEM HCL ER COATED BEADS 180 MG PO CP24: 180.0000 mg | ORAL_CAPSULE | Freq: Every day | ORAL | 6 refills | Status: DC

## 2018-04-29 MED ORDER — FLECAINIDE ACETATE 50 MG PO TABS: 50.0000 mg | ORAL_TABLET | Freq: Two times a day (BID) | ORAL | 6 refills | Status: DC

## 2018-04-29 MED ORDER — LEVOTHYROXINE SODIUM 100 MCG PO TABS: 100.0000 ug | ORAL_TABLET | Freq: Every day | ORAL | 1 refills | Status: DC

## 2018-04-29 NOTE — Patient Instructions (Signed)
Dr Ladona Ridgel Phone: [phone]

## 2018-04-29 NOTE — Progress Notes (Signed)
BP (!) 142/82 (BP Location: Left Arm, Patient Position: Sitting, Cuff Size: Normal)   Pulse 82   Temp (!) 97.5 F (36.4 C) (Other (Comment))   Ht  (1.727 m)   Wt 206 lb 8 oz (93.7 kg)   SpO2 96%   BMI 31.40 kg/m    Subjective:    Patient ID: David Mosley, male    DOB: [birth year], 62 y.o.   MRN: [id number]  HPI: David Mosley is a 62 y.o. male presenting on 04/29/2018 for Follow-up   HPI Pt says he is feeling well.  No cp or sob.    Relevant past medical, surgical, family and social history reviewed and updated as indicated. Interim medical history since our last visit reviewed. Allergies and medications reviewed and updated.   Current Outpatient Medications:  .  diltiazem (CARDIZEM CD) 180 MG 24 hr capsule, Take 1 capsule (180 mg total) by mouth daily., Disp: 30 capsule, Rfl: 0 .  flecainide (TAMBOCOR) 50 MG tablet, Take 1 tablet (50 mg total) by mouth every 12 (twelve) hours., Disp: 60 tablet, Rfl: 0 .  levothyroxine (SYNTHROID, LEVOTHROID) 100 MCG tablet, Take 1 tablet (100 mcg total) by mouth daily., Disp: 30 tablet, Rfl: 1 .  triamcinolone (KENALOG) 0.025 % ointment, Apply 1 application topically 2 (two) times daily., Disp: 30 g, Rfl: 0   Review of Systems  Constitutional: Negative for appetite change, chills, diaphoresis, fatigue, fever and unexpected weight change.  HENT: Negative for congestion, dental problem, drooling, ear pain, facial swelling, hearing loss, mouth sores, sneezing, sore throat, trouble swallowing and voice change.   Eyes: Negative for pain, discharge, redness, itching and visual disturbance.  Respiratory: Negative for cough, choking, shortness of breath and wheezing.   Cardiovascular: Negative for chest pain, palpitations and leg swelling.  Gastrointestinal: Negative for abdominal pain, blood in stool, constipation, diarrhea and vomiting.  Endocrine: Negative for cold intolerance, heat intolerance and polydipsia.  Genitourinary: Negative for  decreased urine volume, dysuria and hematuria.  Musculoskeletal: Negative for arthralgias, back pain and gait problem.  Skin: Negative for rash.  Allergic/Immunologic: Negative for environmental allergies.  Neurological: Negative for seizures, syncope, light-headedness and headaches.  Hematological: Negative for adenopathy.  Psychiatric/Behavioral: Negative for agitation, dysphoric mood and suicidal ideas. The patient is not nervous/anxious.     Per HPI unless specifically indicated above     Objective:    BP (!) 142/82 (BP Location: Left Arm, Patient Position: Sitting, Cuff Size: Normal)   Pulse 82   Temp (!) 97.5 F (36.4 C) (Other (Comment))   Ht  (1.727 m)   Wt 206 lb 8 oz (93.7 kg)   SpO2 96%   BMI 31.40 kg/m   Wt Readings from Last 3 Encounters:  04/29/18 206 lb 8 oz (93.7 kg)  04/11/18 210 lb (95.3 kg)  03/31/18 209 lb 11.2 oz (95.1 kg)    Physical Exam  Constitutional: He is oriented to person, place, and time. He appears well-developed and well-nourished.  HENT:  Head: Normocephalic and atraumatic.  Neck: Neck supple.  Cardiovascular: Normal rate and regular rhythm.  Pulmonary/Chest: Effort normal and breath sounds normal. He has no wheezes.  Abdominal: Soft. Bowel sounds are normal. There is no hepatosplenomegaly. There is no tenderness.  Musculoskeletal: He exhibits no edema.  Lymphadenopathy:    He has no cervical adenopathy.  Neurological: He is alert and oriented to person, place, and time.  Skin: Skin is warm and dry.  Psychiatric: He has a normal mood and affect.  His behavior is normal.  Vitals reviewed.       Assessment & Plan:    Encounter Diagnoses  Name Primary?  . Essential hypertension Yes  . Hypothyroidism, unspecified type   . Cigarette nicotine dependence without complication   . Wide-complex tachycardia (HCC)     -Pt to contact dr Ladona Ridgel (cardiology) for refills on diltiazem and fecainide -Add metoprolol for bp -Continue  current levothyroxine.  -again discussed how smoking worsens his bp and other conditions. Counseled cessation -pt to follow up 1 month to recheck bp with bmp lab before OV.  Pt to RTO sooner prn

## 2018-05-01 ENCOUNTER — Telehealth: Payer: Self-pay | Admitting: Internal Medicine

## 2018-05-01 MED ORDER — FLECAINIDE ACETATE 50 MG PO TABS: 50.0000 mg | ORAL_TABLET | Freq: Two times a day (BID) | ORAL | 6 refills | Status: DC

## 2018-05-01 MED ORDER — DILTIAZEM HCL ER COATED BEADS 180 MG PO CP24: 180.0000 mg | ORAL_CAPSULE | Freq: Every day | ORAL | 6 refills | Status: DC

## 2018-05-01 NOTE — Telephone Encounter (Signed)
Returned call. Spoke with Kara Mead, she stated that the pt's medication is over $100 per month and he cannot afford it as he has no insurance. She stated she will come by office and get prescriptions so that she can [facility] to see if they can help.

## 2018-05-01 NOTE — Telephone Encounter (Signed)
Pt's sister Orpah Greek lvm stating pt cannot afford his medications and would like someone to give her a call @ [phone]

## 2018-06-03 ENCOUNTER — Ambulatory Visit: Payer: Self-pay | Admitting: Physician Assistant

## 2018-06-06 ENCOUNTER — Ambulatory Visit: Payer: Self-pay | Admitting: Physician Assistant

## 2018-06-06 ENCOUNTER — Encounter: Payer: Self-pay | Admitting: Physician Assistant

## 2018-06-06 VITALS — BP 120/76 | HR 81 | Temp 98.1°F | Ht 68.0 in | Wt 212.5 lb

## 2018-06-06 DIAGNOSIS — Z13220 Encounter for screening for lipoid disorders: Secondary | ICD-10-CM

## 2018-06-06 DIAGNOSIS — F17219 Nicotine dependence, cigarettes, with unspecified nicotine-induced disorders: Secondary | ICD-10-CM

## 2018-06-06 DIAGNOSIS — N189 Chronic kidney disease, unspecified: Secondary | ICD-10-CM

## 2018-06-06 DIAGNOSIS — E039 Hypothyroidism, unspecified: Secondary | ICD-10-CM

## 2018-06-06 DIAGNOSIS — I472 Ventricular tachycardia: Secondary | ICD-10-CM

## 2018-06-06 DIAGNOSIS — E669 Obesity, unspecified: Secondary | ICD-10-CM

## 2018-06-06 DIAGNOSIS — I10 Essential (primary) hypertension: Secondary | ICD-10-CM

## 2018-06-06 DIAGNOSIS — I4729 Other ventricular tachycardia: Secondary | ICD-10-CM

## 2018-06-06 MED ORDER — LEVOTHYROXINE SODIUM 100 MCG PO TABS: 100.0000 ug | ORAL_TABLET | Freq: Every day | ORAL | 2 refills | Status: DC

## 2018-06-06 MED ORDER — TRIAMCINOLONE ACETONIDE 0.025 % EX OINT
1.0000 | TOPICAL_OINTMENT | Freq: Two times a day (BID) | CUTANEOUS | 0 refills | Status: DC
Start: 2018-06-06 — End: 2018-10-23

## 2018-06-06 NOTE — Progress Notes (Signed)
BP 120/76 (BP Location: Left Arm, Patient Position: Sitting, Cuff Size: Normal)   Pulse 81   Temp 98.1 F (36.7 C)   Ht 5\' 8"  (1.727 m)   Wt 212 lb 8 oz (96.4 kg)   SpO2 96%   BMI 32.31 kg/m    Subjective:    Patient ID: David Mosley, male    DOB: Oct 22, 1956, 62 y.o.   MRN: [id number]  HPI: David Mosley is a 62 y.o. male presenting on 06/06/2018 for Hypertension   HPI Pt is doing well today and has no complaints.  Pt has no idea what medications he is taking and he did not bring his medications with him to his appointment as instructed.   Pt did not get bloodwork drawn  Relevant past medical, surgical, family and social history reviewed and updated as indicated. Interim medical history since our last visit reviewed. Allergies and medications reviewed and updated.  CURRENT MEDS: unknown  Review of Systems  Constitutional: Negative for appetite change, chills, diaphoresis, fatigue, fever and unexpected weight change.  HENT: Negative for congestion, dental problem, drooling, ear pain, facial swelling, hearing loss, mouth sores, sneezing, sore throat, trouble swallowing and voice change.   Eyes: Negative for pain, discharge, redness, itching and visual disturbance.  Respiratory: Negative for cough, choking, shortness of breath and wheezing.   Cardiovascular: Negative for chest pain, palpitations and leg swelling.  Gastrointestinal: Negative for abdominal pain, blood in stool, constipation, diarrhea and vomiting.  Endocrine: Negative for cold intolerance, heat intolerance and polydipsia.  Genitourinary: Negative for decreased urine volume, dysuria and hematuria.  Musculoskeletal: Negative for arthralgias, back pain and gait problem.  Skin: Negative for rash.  Allergic/Immunologic: Negative for environmental allergies.  Neurological: Negative for seizures, syncope, light-headedness and headaches.  Hematological: Negative for adenopathy.  Psychiatric/Behavioral: Negative for  agitation, dysphoric mood and suicidal ideas. The patient is not nervous/anxious.     Per HPI unless specifically indicated above     Objective:    BP 120/76 (BP Location: Left Arm, Patient Position: Sitting, Cuff Size: Normal)   Pulse 81   Temp 98.1 F (36.7 C)   Ht 5\' 8"  (1.727 m)   Wt 212 lb 8 oz (96.4 kg)   SpO2 96%   BMI 32.31 kg/m   Wt Readings from Last 3 Encounters:  06/06/18 212 lb 8 oz (96.4 kg)  04/29/18 206 lb 8 oz (93.7 kg)  04/11/18 210 lb (95.3 kg)    Physical Exam  Constitutional: He is oriented to person, place, and time. He appears well-developed and well-nourished.  HENT:  Head: Normocephalic and atraumatic.  Neck: Neck supple.  Cardiovascular: Normal rate and regular rhythm.  Pulmonary/Chest: Effort normal and breath sounds normal. He has no wheezes.  Abdominal: Soft. Bowel sounds are normal. There is no hepatosplenomegaly. There is no tenderness.  Musculoskeletal: He exhibits no edema.  Lymphadenopathy:    He has no cervical adenopathy.  Neurological: He is alert and oriented to person, place, and time.  Skin: Skin is warm and dry.  Psychiatric: He has a normal mood and affect. His behavior is normal.  Vitals reviewed.        Assessment & Plan:   Encounter Diagnoses  Name Primary?  . Essential hypertension Yes  . Hypothyroidism, unspecified type   . Chronic kidney disease, unspecified CKD stage   . Paroxysmal ventricular tachycardia (HCC)   . Obesity, unspecified classification, unspecified obesity type, unspecified whether serious comorbidity present   . Cigarette nicotine dependence with nicotine-induced disorder   .  Screening cholesterol level     -No changes today -pt reminded to get blood drawn -Pt reminded to bring all meds to every appointment -pt to Follow up 2 months.  RTO sooner prn

## 2018-08-06 ENCOUNTER — Ambulatory Visit: Payer: Self-pay | Admitting: Physician Assistant

## 2018-08-06 ENCOUNTER — Encounter: Payer: Self-pay | Admitting: Physician Assistant

## 2018-08-06 VITALS — BP 132/80 | HR 75 | Temp 98.1°F | Wt 216.8 lb

## 2018-08-06 DIAGNOSIS — I472 Ventricular tachycardia: Secondary | ICD-10-CM

## 2018-08-06 DIAGNOSIS — I10 Essential (primary) hypertension: Secondary | ICD-10-CM

## 2018-08-06 DIAGNOSIS — N189 Chronic kidney disease, unspecified: Secondary | ICD-10-CM

## 2018-08-06 DIAGNOSIS — R000 Tachycardia, unspecified: Secondary | ICD-10-CM

## 2018-08-06 DIAGNOSIS — E039 Hypothyroidism, unspecified: Secondary | ICD-10-CM

## 2018-08-06 DIAGNOSIS — F17219 Nicotine dependence, cigarettes, with unspecified nicotine-induced disorders: Secondary | ICD-10-CM

## 2018-08-06 MED ORDER — LEVOTHYROXINE SODIUM 100 MCG PO TABS: 100.0000 ug | ORAL_TABLET | Freq: Every day | ORAL | 0 refills | Status: DC

## 2018-08-06 MED ORDER — METOPROLOL TARTRATE 25 MG PO TABS
25.0000 mg | ORAL_TABLET | Freq: Two times a day (BID) | ORAL | 0 refills | Status: DC
Start: 2018-08-06 — End: 2018-09-17

## 2018-08-06 NOTE — Progress Notes (Signed)
BP 132/80 (BP Location: Left Arm, Patient Position: Sitting, Cuff Size: Normal)   Pulse 75   Temp 98.1 F (36.7 C)   Wt 216 lb 12 oz (98.3 kg)   SpO2 95%   BMI 32.96 kg/m    Subjective:    Patient ID: David Mosley, male    DOB: 03/18/1956, 62 y.o.   MRN: [id number]  HPI: David NewcomerDonnie Jue is a 62 y.o. male presenting on 08/06/2018 for Hypertension and Thyroid Problem   HPI   Pt did not get his labs drawn and the only med his brought was his levothyroxine.  He doesn't know what he is taking.  He was instructed at last OV to get his labs drawn and bring all of his meds but he did not.  He is still smoking  Pt says he has thought about the ablation and does not want to do that.  We discussed at his last OV the ablation that was recommended by cardiology.  Pt says his reason is because of the risk of needing a pacemaker afterwards.   He says he is feeling well in reference to palpitations and chest pains.   Relevant past medical, surgical, family and social history reviewed and updated as indicated. Interim medical history since our last visit reviewed. Allergies and medications reviewed and updated.   Current Outpatient Medications:  .  levothyroxine (SYNTHROID, LEVOTHROID) 100 MCG tablet, Take 1 tablet (100 mcg total) by mouth daily., Disp: 30 tablet, Rfl: 2 .  triamcinolone (KENALOG) 0.025 % ointment, Apply 1 application topically 2 (two) times daily., Disp: 30 g, Rfl: 0 .  diltiazem (CARDIZEM CD) 180 MG 24 hr capsule, Take 1 capsule (180 mg total) by mouth daily., Disp: 30 capsule, Rfl: 6 .  flecainide (TAMBOCOR) 50 MG tablet, Take 1 tablet (50 mg total) by mouth every 12 (twelve) hours., Disp: 60 tablet, Rfl: 6 .  metoprolol tartrate (LOPRESSOR) 25 MG tablet, Take 1 tablet (25 mg total) by mouth 2 (two) times daily. (Patient not taking: Reported on 08/06/2018), Disp: 60 tablet, Rfl: 1   Review of Systems  Constitutional: Negative for appetite change, chills, diaphoresis, fatigue, fever  and unexpected weight change.  HENT: Negative for congestion, dental problem, drooling, ear pain, facial swelling, hearing loss, mouth sores, sneezing, sore throat, trouble swallowing and voice change.   Eyes: Positive for visual disturbance. Negative for pain, discharge, redness and itching.  Respiratory: Negative for cough, choking, shortness of breath and wheezing.   Cardiovascular: Negative for chest pain, palpitations and leg swelling.  Gastrointestinal: Negative for abdominal pain, blood in stool, constipation, diarrhea and vomiting.  Endocrine: Negative for cold intolerance, heat intolerance and polydipsia.  Genitourinary: Negative for decreased urine volume, dysuria and hematuria.  Musculoskeletal: Negative for arthralgias, back pain and gait problem.  Skin: Negative for rash.  Allergic/Immunologic: Negative for environmental allergies.  Neurological: Negative for seizures, syncope, light-headedness and headaches.  Hematological: Negative for adenopathy.  Psychiatric/Behavioral: Negative for agitation, dysphoric mood and suicidal ideas. The patient is not nervous/anxious.     Per HPI unless specifically indicated above     Objective:    BP 132/80 (BP Location: Left Arm, Patient Position: Sitting, Cuff Size: Normal)   Pulse 75   Temp 98.1 F (36.7 C)   Wt 216 lb 12 oz (98.3 kg)   SpO2 95%   BMI 32.96 kg/m   Wt Readings from Last 3 Encounters:  08/06/18 216 lb 12 oz (98.3 kg)  06/06/18 212 lb 8 oz (96.4 kg)  04/29/18 206 lb 8 oz (93.7 kg)    Physical Exam  Constitutional: He is oriented to person, place, and time. He appears well-developed and well-nourished.  HENT:  Head: Normocephalic and atraumatic.  Neck: Neck supple.  Cardiovascular: Normal rate and regular rhythm.  Pulmonary/Chest: Effort normal and breath sounds normal. He has no wheezes.  Abdominal: Soft. Bowel sounds are normal. There is no hepatosplenomegaly. There is no tenderness.  Musculoskeletal: He  exhibits no edema.  Lymphadenopathy:    He has no cervical adenopathy.  Neurological: He is alert and oriented to person, place, and time.  Skin: Skin is warm and dry.  Psychiatric: He has a normal mood and affect. His behavior is normal.  Vitals reviewed.       Assessment & Plan:   Encounter Diagnoses  Name Primary?  . Essential hypertension Yes  . Hypothyroidism, unspecified type   . Chronic kidney disease, unspecified CKD stage   . Cigarette nicotine dependence with nicotine-induced disorder   . Wide-complex tachycardia (HCC)      -Pt again counseled to Get labs drawn -pt again counseled to Bring ALL meds to every appt -pt to Follow up 4 wk.  RTO sooner prn

## 2018-08-06 NOTE — Patient Instructions (Signed)
GET BLOOD DRAWN  BRING ALL MEDS TO EVERY APPOINTMENT

## 2018-08-09 ENCOUNTER — Other Ambulatory Visit (HOSPITAL_COMMUNITY)
Admission: RE | Admit: 2018-08-09 | Discharge: 2018-08-09 | Disposition: A | Discharge status: Home / Self Care | Payer: Self-pay | Source: Ambulatory Visit | Attending: Physician Assistant | Admitting: Physician Assistant

## 2018-08-09 DIAGNOSIS — E039 Hypothyroidism, unspecified: Secondary | ICD-10-CM | POA: Insufficient documentation

## 2018-08-09 DIAGNOSIS — Z13220 Encounter for screening for lipoid disorders: Secondary | ICD-10-CM | POA: Insufficient documentation

## 2018-08-09 DIAGNOSIS — I10 Essential (primary) hypertension: Secondary | ICD-10-CM | POA: Insufficient documentation

## 2018-08-09 DIAGNOSIS — I472 Ventricular tachycardia: Secondary | ICD-10-CM | POA: Insufficient documentation

## 2018-08-09 DIAGNOSIS — I4729 Other ventricular tachycardia: Secondary | ICD-10-CM

## 2018-08-09 DIAGNOSIS — N189 Chronic kidney disease, unspecified: Secondary | ICD-10-CM | POA: Insufficient documentation

## 2018-08-09 LAB — COMPREHENSIVE METABOLIC PANEL
ALT: 21 U/L (ref 0–44)
AST: 24 U/L (ref 15–41)
Albumin: 3.9 g/dL (ref 3.5–5.0)
Alkaline Phosphatase: 41 U/L (ref 38–126)
Anion gap: 6 (ref 5–15)
BUN: 11 mg/dL (ref 8–23)
CO2: 24 mmol/L (ref 22–32)
Calcium: 9 mg/dL (ref 8.9–10.3)
Chloride: 105 mmol/L (ref 98–111)
Creatinine, Ser: 1.24 mg/dL (ref 0.61–1.24)
GFR calc Af Amer: >60 mL/min (ref >60)
GFR calc non Af Amer: >60 mL/min (ref >60)
Glucose, Bld: 94 mg/dL (ref 70–99)
Potassium: 3.7 mmol/L (ref 3.5–5.1)
Sodium: 135 mmol/L (ref 135–145)
Total Bilirubin: 0.8 mg/dL (ref 0.3–1.2)
Total Protein: 7.3 g/dL (ref 6.5–8.1)

## 2018-08-09 LAB — LIPID PANEL
CHOL/HDL RATIO: 5.4 ratio
Cholesterol: 177 mg/dL (ref 0–200)
HDL: 33 mg/dL — ABNORMAL LOW (ref >40)
LDL Cholesterol: 125 mg/dL — ABNORMAL HIGH (ref 0–99)
TRIGLYCERIDES: 95 mg/dL (ref <150)
VLDL: 19 mg/dL (ref 0–40)

## 2018-08-09 LAB — TSH: TSH: 3.522 u[IU]/mL (ref 0.350–4.500)

## 2018-09-03 ENCOUNTER — Ambulatory Visit: Payer: Self-pay | Admitting: Physician Assistant

## 2018-09-10 ENCOUNTER — Ambulatory Visit: Payer: Self-pay | Admitting: Physician Assistant

## 2018-09-17 ENCOUNTER — Ambulatory Visit: Payer: Self-pay | Admitting: Physician Assistant

## 2018-09-17 ENCOUNTER — Encounter: Payer: Self-pay | Admitting: Physician Assistant

## 2018-09-17 VITALS — BP 150/76 | HR 65 | Temp 98.7°F | Ht 68.0 in | Wt 221.8 lb

## 2018-09-17 DIAGNOSIS — F17200 Nicotine dependence, unspecified, uncomplicated: Secondary | ICD-10-CM

## 2018-09-17 DIAGNOSIS — Z550 Illiteracy and low-level literacy: Secondary | ICD-10-CM

## 2018-09-17 DIAGNOSIS — E039 Hypothyroidism, unspecified: Secondary | ICD-10-CM

## 2018-09-17 DIAGNOSIS — I10 Essential (primary) hypertension: Secondary | ICD-10-CM

## 2018-09-17 DIAGNOSIS — E785 Hyperlipidemia, unspecified: Secondary | ICD-10-CM

## 2018-09-17 MED ORDER — METOPROLOL TARTRATE 50 MG PO TABS: 50.0000 mg | ORAL_TABLET | Freq: Two times a day (BID) | ORAL | 4 refills | Status: DC

## 2018-09-17 MED ORDER — ATORVASTATIN CALCIUM 20 MG PO TABS: 20.0000 mg | ORAL_TABLET | Freq: Every day | ORAL | 4 refills | Status: DC

## 2018-09-17 NOTE — Patient Instructions (Signed)
Cholesterol Cholesterol is a fat. Your body needs a small amount of cholesterol. Cholesterol (plaque) may build up in your blood vessels (arteries). That makes you more likely to have a heart attack or stroke. You cannot feel your cholesterol level. Having a blood test is the only way to find out if your level is high. Keep your test results. Work with your doctor to keep your cholesterol at a good level. What do the results mean?  Total cholesterol is how much cholesterol is in your blood.  LDL is bad cholesterol. This is the type that can build up. Try to have low LDL.  HDL is good cholesterol. It cleans your blood vessels and carries LDL away. Try to have high HDL.  Triglycerides are fat that the body can store or burn for energy. What are good levels of cholesterol?  Total cholesterol below 200.  LDL below 100 is good for people who have health risks. LDL below 70 is good for people who have very high risks.  HDL above 40 is good. It is best to have HDL of 60 or higher.  Triglycerides below 150. How can I lower my cholesterol? Diet Follow your diet program as told by your doctor.  Choose fish, white meat chicken, or turkey that is roasted or baked. Try not to eat red meat, fried foods, sausage, or lunch meats.  Eat lots of fresh fruits and vegetables.  Choose whole grains, beans, pasta, potatoes, and cereals.  Choose olive oil, corn oil, or canola oil. Only use small amounts.  Try not to eat butter, mayonnaise, shortening, or palm kernel oils.  Try not to eat foods with trans fats.  Choose low-fat or nonfat dairy foods. ? Drink skim or nonfat milk. ? Eat low-fat or nonfat yogurt and cheeses. ? Try not to drink whole milk or cream. ? Try not to eat ice cream, egg yolks, or full-fat cheeses.  Healthy desserts include angel food cake, ginger snaps, animal crackers, hard candy, popsicles, and low-fat or nonfat frozen yogurt. Try not to eat pastries, cakes, pies, and  cookies.  Exercise Follow your exercise program as told by your doctor.  Be more active. Try gardening, walking, and taking the stairs.  Ask your doctor about ways that you can be more active.  Medicine  Take over-the-counter and prescription medicines only as told by your doctor. This information is not intended to replace advice given to you by your health care provider. Make sure you discuss any questions you have with your health care provider. Document Released: 03/02/2009 Document Revised: 07/05/2016 Document Reviewed: 06/15/2016 [employer] Interactive Patient Education  2018 [employer]  

## 2018-09-17 NOTE — Progress Notes (Signed)
BP (!) 150/76   Pulse 65   Temp 98.7 F (37.1 C)   Ht 5\' 8"  (1.727 m)   Wt 221 lb 12 oz (100.6 kg)   SpO2 98%   BMI 33.72 kg/m    Subjective:    Patient ID: David Mosley, male    DOB: 1956/02/16, 62 y.o.   MRN: [id number]  HPI: David Mosley is a 62 y.o. male presenting on 09/17/2018 for Hypertension and Hypothyroidism   HPI   Pt did not bring meds again today.  Pt is reminded that he needs to them them to every appointment  pt says he is doing well and has felt no episodes of racing heart.  He says he is definitely not going to get the ablation because he is afraid of the risk for needing a pacemaker.    Pt is still smoking.   Relevant past medical, surgical, family and social history reviewed and updated as indicated. Interim medical history since our last visit reviewed. Allergies and medications reviewed and updated.    Current Outpatient Medications:  .  diltiazem (CARDIZEM CD) 180 MG 24 hr capsule, Take 1 capsule (180 mg total) by mouth daily., Disp: 30 capsule, Rfl: 6 .  flecainide (TAMBOCOR) 50 MG tablet, Take 1 tablet (50 mg total) by mouth every 12 (twelve) hours., Disp: 60 tablet, Rfl: 6 .  levothyroxine (SYNTHROID, LEVOTHROID) 100 MCG tablet, Take 1 tablet (100 mcg total) by mouth daily., Disp: 30 tablet, Rfl: 0 .  metoprolol tartrate (LOPRESSOR) 25 MG tablet, Take 1 tablet (25 mg total) by mouth 2 (two) times daily., Disp: 60 tablet, Rfl: 0 .  triamcinolone (KENALOG) 0.025 % ointment, Apply 1 application topically 2 (two) times daily., Disp: 30 g, Rfl: 0  Review of Systems  Respiratory: Negative for shortness of breath.   Cardiovascular: Negative for chest pain.  Gastrointestinal: Negative for abdominal pain.    Per HPI unless specifically indicated above     Objective:    BP (!) 150/76   Pulse 65   Temp 98.7 F (37.1 C)   Ht 5\' 8"  (1.727 m)   Wt 221 lb 12 oz (100.6 kg)   SpO2 98%   BMI 33.72 kg/m   Wt Readings from Last 3 Encounters:  09/17/18 221  lb 12 oz (100.6 kg)  08/06/18 216 lb 12 oz (98.3 kg)  06/06/18 212 lb 8 oz (96.4 kg)    Physical Exam  Constitutional: He is oriented to person, place, and time. He appears well-developed and well-nourished.  HENT:  Head: Normocephalic and atraumatic.  Neck: Neck supple.  Cardiovascular: Normal rate and regular rhythm.  Pulmonary/Chest: Effort normal and breath sounds normal. He has no wheezes.  Abdominal: Soft. Bowel sounds are normal. There is no hepatosplenomegaly. There is no tenderness.  Musculoskeletal: He exhibits no edema.  Lymphadenopathy:    He has no cervical adenopathy.  Neurological: He is alert and oriented to person, place, and time.  Skin: Skin is warm and dry.  Psychiatric: He has a normal mood and affect. His behavior is normal.  Vitals reviewed.   Results for orders placed or performed during the hospital encounter of 08/09/18  Comprehensive metabolic panel  Result Value Ref Range   Sodium 135 135 - 145 mmol/L   Potassium 3.7 3.5 - 5.1 mmol/L   Chloride 105 98 - 111 mmol/L   CO2 24 22 - 32 mmol/L   Glucose, Bld 94 70 - 99 mg/dL   BUN 11 8 - 23  mg/dL   Creatinine, Ser 1.61 0.61 - 1.24 mg/dL   Calcium 9.0 8.9 - 09.6 mg/dL   Total Protein 7.3 6.5 - 8.1 g/dL   Albumin 3.9 3.5 - 5.0 g/dL   AST 24 15 - 41 U/L   ALT 21 0 - 44 U/L   Alkaline Phosphatase 41 38 - 126 U/L   Total Bilirubin 0.8 0.3 - 1.2 mg/dL   GFR calc non Af Amer >60 >60 mL/min   GFR calc Af Amer >60 >60 mL/min   Anion gap 6 5 - 15  Lipid panel  Result Value Ref Range   Cholesterol 177 0 - 200 mg/dL   Triglycerides 95 <045 mg/dL   HDL 33 (L) >40 mg/dL   Total CHOL/HDL Ratio 5.4 RATIO   VLDL 19 0 - 40 mg/dL   LDL Cholesterol 981 (H) 0 - 99 mg/dL  TSH  Result Value Ref Range   TSH 3.522 0.350 - 4.500 uIU/mL      Assessment & Plan:    Encounter Diagnoses  Name Primary?  . Essential hypertension Yes  . Hypothyroidism, unspecified type   . Hyperlipidemia, unspecified hyperlipidemia  type   . Tobacco use disorder   . Unable to read or write     -reviewed labs with pt -Add atorvastatin.  Counseled on lowfat diet and gave reading material.  He says his friend will read it to him -Increase metoprolol due to blood pressure -pt encouraged to call cardiology office to inquire when he needs to schedule follow up appointment -counseled smoking cessation -Follow up 4 weeks to recheck blood pressure.  Pt to RTO sooner prn

## 2018-10-15 ENCOUNTER — Ambulatory Visit: Payer: Self-pay | Admitting: Physician Assistant

## 2018-10-15 ENCOUNTER — Encounter: Payer: Self-pay | Admitting: Physician Assistant

## 2018-10-15 VITALS — BP 151/79 | HR 61 | Temp 97.9°F | Wt 220.0 lb

## 2018-10-15 DIAGNOSIS — E039 Hypothyroidism, unspecified: Secondary | ICD-10-CM

## 2018-10-15 DIAGNOSIS — I10 Essential (primary) hypertension: Secondary | ICD-10-CM

## 2018-10-15 DIAGNOSIS — F17200 Nicotine dependence, unspecified, uncomplicated: Secondary | ICD-10-CM

## 2018-10-15 DIAGNOSIS — E785 Hyperlipidemia, unspecified: Secondary | ICD-10-CM

## 2018-10-15 DIAGNOSIS — I472 Ventricular tachycardia: Secondary | ICD-10-CM

## 2018-10-15 DIAGNOSIS — Z550 Illiteracy and low-level literacy: Secondary | ICD-10-CM

## 2018-10-15 DIAGNOSIS — R000 Tachycardia, unspecified: Secondary | ICD-10-CM

## 2018-10-15 MED ORDER — LOSARTAN POTASSIUM 50 MG PO TABS: 50.0000 mg | ORAL_TABLET | Freq: Every day | ORAL | 1 refills | Status: DC

## 2018-10-15 MED ORDER — ATORVASTATIN CALCIUM 20 MG PO TABS: 20.0000 mg | ORAL_TABLET | Freq: Every day | ORAL | 1 refills | Status: DC

## 2018-10-15 MED ORDER — METOPROLOL TARTRATE 25 MG PO TABS: 25.0000 mg | ORAL_TABLET | Freq: Two times a day (BID) | ORAL | 1 refills | Status: DC

## 2018-10-15 MED ORDER — METOPROLOL TARTRATE 50 MG PO TABS: 25.0000 mg | ORAL_TABLET | Freq: Two times a day (BID) | ORAL | 4 refills | Status: DC

## 2018-10-15 MED ORDER — LEVOTHYROXINE SODIUM 100 MCG PO TABS: 100.0000 ug | ORAL_TABLET | Freq: Every day | ORAL | 0 refills | Status: DC

## 2018-10-15 NOTE — Patient Instructions (Signed)
Financial counselor- [phone]   

## 2018-10-15 NOTE — Progress Notes (Signed)
BP (!) 151/79 (BP Location: Right Arm, Patient Position: Sitting, Cuff Size: Normal)   Pulse 61   Temp 97.9 F (36.6 C)   Wt 220 lb (99.8 kg)   SpO2 92%   BMI 33.45 kg/m    Subjective:    Patient ID: David Mosley, male    DOB: 05-May-1956, 62 y.o.   MRN: [id number]  HPI: David Mosley is a 62 y.o. male presenting on 10/15/2018 for Hypertension   HPI   Pt says that when he takes the flecainide and the metoprolol in the morning he sees sparks.  Thus he has been skipping his morning flecainide dose.  He says otherwise he feels well.  He has cut back smoking to 3 packs last a week now.   Relevant past medical, surgical, family and social history reviewed and updated as indicated. Interim medical history since our last visit reviewed. Allergies and medications reviewed and updated.   Current Outpatient Medications:  .  atorvastatin (LIPITOR) 20 MG tablet, Take 1 tablet (20 mg total) by mouth daily., Disp: 30 tablet, Rfl: 4 .  diltiazem (CARDIZEM CD) 180 MG 24 hr capsule, Take 1 capsule (180 mg total) by mouth daily., Disp: 30 capsule, Rfl: 6 .  flecainide (TAMBOCOR) 50 MG tablet, Take 1 tablet (50 mg total) by mouth every 12 (twelve) hours., Disp: 60 tablet, Rfl: 6 .  levothyroxine (SYNTHROID, LEVOTHROID) 100 MCG tablet, Take 1 tablet (100 mcg total) by mouth daily., Disp: 30 tablet, Rfl: 0 .  metoprolol tartrate (LOPRESSOR) 50 MG tablet, Take 1 tablet (50 mg total) by mouth 2 (two) times daily., Disp: 60 tablet, Rfl: 4 .  triamcinolone (KENALOG) 0.025 % ointment, Apply 1 application topically 2 (two) times daily., Disp: 30 g, Rfl: 0  Review of Systems  Respiratory: Negative for shortness of breath.   Cardiovascular: Negative for chest pain.  Gastrointestinal: Negative for abdominal pain.    Per HPI unless specifically indicated above     Objective:    BP (!) 151/79 (BP Location: Right Arm, Patient Position: Sitting, Cuff Size: Normal)   Pulse 61   Temp 97.9 F (36.6 C)   Wt  220 lb (99.8 kg)   SpO2 92%   BMI 33.45 kg/m   Wt Readings from Last 3 Encounters:  10/15/18 220 lb (99.8 kg)  09/17/18 221 lb 12 oz (100.6 kg)  08/06/18 216 lb 12 oz (98.3 kg)    Physical Exam  Constitutional: He is oriented to person, place, and time. He appears well-developed and well-nourished.  HENT:  Head: Normocephalic and atraumatic.  Neck: Neck supple.  Cardiovascular: Normal rate and regular rhythm.  Pulmonary/Chest: Effort normal and breath sounds normal. He has no wheezes.  Abdominal: Soft. Bowel sounds are normal. There is no hepatosplenomegaly. There is no tenderness.  Musculoskeletal: He exhibits no edema (trace BLE).  Lymphadenopathy:    He has no cervical adenopathy.  Neurological: He is alert and oriented to person, place, and time.  Skin: Skin is warm and dry.  Psychiatric: He has a normal mood and affect. His behavior is normal.  Vitals reviewed.       Assessment & Plan:   Encounter Diagnoses  Name Primary?  . Essential hypertension Yes  . Hypothyroidism, unspecified type   . Hyperlipidemia, unspecified hyperlipidemia type   . Tobacco use disorder   . Unable to read or write   . Wide-complex tachycardia (HCC)      -Will decrease metoprolol a bit and counseled him to take both  but will add losartan.  Pt unable to take norvasc.  Will monitor renal function.   -will Re-refer to cardiology.  Pt does not want ablation but need assistance with monitoring the dysrhythmia  -pt pill bottles are given a yellow sticker on the top if they are bid (since he is a non-reader).  No sticker meds are once daily -pt is signed up for medassist because he is having problems affording his meds -pt to follow up 1 month with recheck labs before appointment

## 2018-10-22 ENCOUNTER — Encounter: Payer: Self-pay | Admitting: Student

## 2018-10-23 ENCOUNTER — Encounter (HOSPITAL_COMMUNITY): Payer: Self-pay | Admitting: Emergency Medicine

## 2018-10-23 ENCOUNTER — Emergency Department (HOSPITAL_COMMUNITY): Payer: Medicaid Other

## 2018-10-23 ENCOUNTER — Inpatient Hospital Stay (HOSPITAL_COMMUNITY): Payer: Medicaid Other

## 2018-10-23 ENCOUNTER — Inpatient Hospital Stay (HOSPITAL_COMMUNITY)
Admission: EM | Admit: 2018-10-23 | Discharge: 2018-10-25 | DRG: 065 | Disposition: A | Discharge status: Rehabilitation Facility | Payer: Medicaid Other | Attending: Internal Medicine | Admitting: Internal Medicine

## 2018-10-23 ENCOUNTER — Other Ambulatory Visit: Payer: Self-pay

## 2018-10-23 DIAGNOSIS — I739 Peripheral vascular disease, unspecified: Secondary | ICD-10-CM | POA: Diagnosis present

## 2018-10-23 DIAGNOSIS — Z888 Allergy status to other drugs, medicaments and biological substances status: Secondary | ICD-10-CM | POA: Diagnosis not present

## 2018-10-23 DIAGNOSIS — N183 Chronic kidney disease, stage 3 (moderate): Secondary | ICD-10-CM | POA: Diagnosis present

## 2018-10-23 DIAGNOSIS — G8191 Hemiplegia, unspecified affecting right dominant side: Secondary | ICD-10-CM | POA: Diagnosis present

## 2018-10-23 DIAGNOSIS — E039 Hypothyroidism, unspecified: Secondary | ICD-10-CM | POA: Diagnosis present

## 2018-10-23 DIAGNOSIS — R7989 Other specified abnormal findings of blood chemistry: Secondary | ICD-10-CM | POA: Diagnosis present

## 2018-10-23 DIAGNOSIS — R200 Anesthesia of skin: Secondary | ICD-10-CM | POA: Diagnosis present

## 2018-10-23 DIAGNOSIS — Z79899 Other long term (current) drug therapy: Secondary | ICD-10-CM | POA: Diagnosis not present

## 2018-10-23 DIAGNOSIS — R52 Pain, unspecified: Secondary | ICD-10-CM

## 2018-10-23 DIAGNOSIS — Z8661 Personal history of infections of the central nervous system: Secondary | ICD-10-CM

## 2018-10-23 DIAGNOSIS — E669 Obesity, unspecified: Secondary | ICD-10-CM | POA: Diagnosis present

## 2018-10-23 DIAGNOSIS — Z880 Allergy status to penicillin: Secondary | ICD-10-CM | POA: Diagnosis not present

## 2018-10-23 DIAGNOSIS — I6522 Occlusion and stenosis of left carotid artery: Secondary | ICD-10-CM | POA: Diagnosis present

## 2018-10-23 DIAGNOSIS — E785 Hyperlipidemia, unspecified: Secondary | ICD-10-CM | POA: Diagnosis present

## 2018-10-23 DIAGNOSIS — I639 Cerebral infarction, unspecified: Principal | ICD-10-CM

## 2018-10-23 DIAGNOSIS — Z6832 Body mass index (BMI) 32.0-32.9, adult: Secondary | ICD-10-CM

## 2018-10-23 DIAGNOSIS — R29705 NIHSS score 5: Secondary | ICD-10-CM | POA: Diagnosis present

## 2018-10-23 DIAGNOSIS — Z8679 Personal history of other diseases of the circulatory system: Secondary | ICD-10-CM

## 2018-10-23 DIAGNOSIS — F17219 Nicotine dependence, cigarettes, with unspecified nicotine-induced disorders: Secondary | ICD-10-CM | POA: Diagnosis present

## 2018-10-23 DIAGNOSIS — K5900 Constipation, unspecified: Secondary | ICD-10-CM | POA: Diagnosis not present

## 2018-10-23 DIAGNOSIS — I129 Hypertensive chronic kidney disease with stage 1 through stage 4 chronic kidney disease, or unspecified chronic kidney disease: Secondary | ICD-10-CM | POA: Diagnosis present

## 2018-10-23 DIAGNOSIS — I10 Essential (primary) hypertension: Secondary | ICD-10-CM | POA: Diagnosis present

## 2018-10-23 HISTORY — DX: Supraventricular tachycardia: I47.1

## 2018-10-23 HISTORY — DX: Peripheral vascular disease, unspecified: I73.9

## 2018-10-23 HISTORY — DX: Supraventricular tachycardia, unspecified: I47.10

## 2018-10-23 LAB — DIFFERENTIAL
ABS IMMATURE GRANULOCYTES: 0.04 10*3/uL (ref 0.00–0.07)
BASOS ABS: 0.1 10*3/uL (ref 0.0–0.1)
Basophils Relative: 1 %
Eosinophils Absolute: 0.5 10*3/uL (ref 0.0–0.5)
Eosinophils Relative: 5 %
IMMATURE GRANULOCYTES: 0 %
LYMPHS PCT: 22 %
Lymphs Abs: 2 10*3/uL (ref 0.7–4.0)
Monocytes Absolute: 0.9 10*3/uL (ref 0.1–1.0)
Monocytes Relative: 10 %
NEUTROS ABS: 5.5 10*3/uL (ref 1.7–7.7)
Neutrophils Relative %: 62 %

## 2018-10-23 LAB — ETHANOL: Alcohol, Ethyl (B): <10 mg/dL (ref <10)

## 2018-10-23 LAB — COMPREHENSIVE METABOLIC PANEL
ALBUMIN: 3.9 g/dL (ref 3.5–5.0)
ALK PHOS: 41 U/L (ref 38–126)
ALT: 21 U/L (ref 0–44)
AST: 23 U/L (ref 15–41)
Anion gap: 7 (ref 5–15)
BUN: 12 mg/dL (ref 8–23)
CALCIUM: 8.8 mg/dL — AB (ref 8.9–10.3)
CO2: 25 mmol/L (ref 22–32)
CREATININE: 1.26 mg/dL — AB (ref 0.61–1.24)
Chloride: 104 mmol/L (ref 98–111)
GFR calc Af Amer: >60 mL/min (ref >60)
GFR calc non Af Amer: 60 mL/min — ABNORMAL LOW (ref >60)
GLUCOSE: 122 mg/dL — AB (ref 70–99)
Potassium: 3.6 mmol/L (ref 3.5–5.1)
Sodium: 136 mmol/L (ref 135–145)
Total Bilirubin: 0.8 mg/dL (ref 0.3–1.2)
Total Protein: 7.6 g/dL (ref 6.5–8.1)

## 2018-10-23 LAB — RAPID URINE DRUG SCREEN, HOSP PERFORMED
AMPHETAMINES: NOT DETECTED
Barbiturates: NOT DETECTED
Benzodiazepines: NOT DETECTED
Cocaine: NOT DETECTED
Opiates: NOT DETECTED
TETRAHYDROCANNABINOL: NOT DETECTED

## 2018-10-23 LAB — URINALYSIS, ROUTINE W REFLEX MICROSCOPIC
BILIRUBIN URINE: NEGATIVE
Glucose, UA: NEGATIVE mg/dL
Hgb urine dipstick: NEGATIVE
KETONES UR: NEGATIVE mg/dL
Leukocytes, UA: NEGATIVE
NITRITE: NEGATIVE
PH: 6 (ref 5.0–8.0)
Protein, ur: NEGATIVE mg/dL
Specific Gravity, Urine: 1.01 (ref 1.005–1.030)

## 2018-10-23 LAB — PROTIME-INR
INR: 0.98
Prothrombin Time: 12.9 seconds (ref 11.4–15.2)

## 2018-10-23 LAB — I-STAT CHEM 8, ED
BUN: 12 mg/dL (ref 8–23)
CHLORIDE: 101 mmol/L (ref 98–111)
Calcium, Ion: 1.17 mmol/L (ref 1.15–1.40)
Creatinine, Ser: 1.3 mg/dL — ABNORMAL HIGH (ref 0.61–1.24)
GLUCOSE: 119 mg/dL — AB (ref 70–99)
HCT: 41 % (ref 39.0–52.0)
Hemoglobin: 13.9 g/dL (ref 13.0–17.0)
Potassium: 3.7 mmol/L (ref 3.5–5.1)
Sodium: 137 mmol/L (ref 135–145)
TCO2: 25 mmol/L (ref 22–32)

## 2018-10-23 LAB — CBC
HCT: 42 % (ref 39.0–52.0)
HEMOGLOBIN: 13.7 g/dL (ref 13.0–17.0)
MCH: 30 pg (ref 26.0–34.0)
MCHC: 32.6 g/dL (ref 30.0–36.0)
MCV: 91.9 fL (ref 80.0–100.0)
NRBC: 0 % (ref 0.0–0.2)
Platelets: 179 10*3/uL (ref 150–400)
RBC: 4.57 MIL/uL (ref 4.22–5.81)
RDW: 13.2 % (ref 11.5–15.5)
WBC: 9 10*3/uL (ref 4.0–10.5)

## 2018-10-23 LAB — I-STAT TROPONIN, ED: Troponin i, poc: 0.01 ng/mL (ref 0.00–0.08)

## 2018-10-23 LAB — TSH: TSH: 4.456 u[IU]/mL (ref 0.350–4.500)

## 2018-10-23 LAB — CBG MONITORING, ED: GLUCOSE-CAPILLARY: 115 mg/dL — AB (ref 70–99)

## 2018-10-23 LAB — APTT: aPTT: 26 seconds (ref 24–36)

## 2018-10-23 MED ORDER — FLECAINIDE ACETATE 50 MG PO TABS
50.0000 mg | ORAL_TABLET | Freq: Two times a day (BID) | ORAL | Status: DC
Start: 2018-10-23 — End: 2018-10-25
  Administered 2018-10-23 – 2018-10-25 (×4): 50 mg via ORAL
  Filled 2018-10-23 (×5): qty 1

## 2018-10-23 MED ORDER — STROKE: EARLY STAGES OF RECOVERY BOOK
Freq: Once | Status: AC
  Administered 2018-10-23: 18:00:00
  Filled 2018-10-23 (×2): qty 1

## 2018-10-23 MED ORDER — ATORVASTATIN CALCIUM 10 MG PO TABS
20.0000 mg | ORAL_TABLET | Freq: Every day | ORAL | Status: DC
Start: 2018-10-23 — End: 2018-10-24
  Administered 2018-10-23 – 2018-10-24 (×2): 20 mg via ORAL
  Filled 2018-10-23 (×2): qty 2

## 2018-10-23 MED ORDER — HEPARIN SODIUM (PORCINE) 5000 UNIT/ML IJ SOLN
5000.0000 [IU] | Freq: Three times a day (TID) | INTRAMUSCULAR | Status: DC
  Administered 2018-10-23 – 2018-10-25 (×6): 5000 [IU] via SUBCUTANEOUS
  Filled 2018-10-23 (×5): qty 1

## 2018-10-23 MED ORDER — ASPIRIN 300 MG RE SUPP: 300.0000 mg | Freq: Every day | RECTAL | Status: DC

## 2018-10-23 MED ORDER — METOPROLOL TARTRATE 12.5 MG HALF TABLET
12.5000 mg | ORAL_TABLET | Freq: Two times a day (BID) | ORAL | Status: DC
  Administered 2018-10-24 – 2018-10-25 (×4): 12.5 mg via ORAL
  Filled 2018-10-23 (×4): qty 1

## 2018-10-23 MED ORDER — SODIUM CHLORIDE 0.9 % IV BOLUS
1000.0000 mL | Freq: Once | INTRAVENOUS | Status: AC
  Administered 2018-10-23: 1000 mL via INTRAVENOUS

## 2018-10-23 MED ORDER — ACETAMINOPHEN 325 MG PO TABS: 650.0000 mg | ORAL_TABLET | ORAL | Status: DC | PRN

## 2018-10-23 MED ORDER — ACETAMINOPHEN 160 MG/5ML PO SOLN: 650.0000 mg | ORAL | Status: DC | PRN

## 2018-10-23 MED ORDER — HYDRALAZINE HCL 20 MG/ML IJ SOLN: 10.0000 mg | INTRAMUSCULAR | Status: DC | PRN

## 2018-10-23 MED ORDER — METOPROLOL TARTRATE 25 MG PO TABS: 25.0000 mg | ORAL_TABLET | Freq: Two times a day (BID) | ORAL | Status: DC

## 2018-10-23 MED ORDER — IOPAMIDOL (ISOVUE-370) INJECTION 76%
100.0000 mL | Freq: Once | INTRAVENOUS | Status: AC | PRN
  Administered 2018-10-23: 75 mL via INTRAVENOUS

## 2018-10-23 MED ORDER — IOPAMIDOL (ISOVUE-370) INJECTION 76%
100.0000 mL | Freq: Once | INTRAVENOUS | Status: AC | PRN
  Administered 2018-10-23: 40 mL via INTRAVENOUS

## 2018-10-23 MED ORDER — ACETAMINOPHEN 650 MG RE SUPP: 650.0000 mg | RECTAL | Status: DC | PRN

## 2018-10-23 MED ORDER — LEVOTHYROXINE SODIUM 50 MCG PO TABS
100.0000 ug | ORAL_TABLET | Freq: Every day | ORAL | Status: DC
  Administered 2018-10-24 – 2018-10-25 (×2): 100 ug via ORAL
  Filled 2018-10-23 (×2): qty 2

## 2018-10-23 MED ORDER — DILTIAZEM HCL ER COATED BEADS 180 MG PO CP24
180.0000 mg | ORAL_CAPSULE | Freq: Every day | ORAL | Status: DC
  Administered 2018-10-24 – 2018-10-25 (×2): 180 mg via ORAL
  Filled 2018-10-23 (×2): qty 1

## 2018-10-23 MED ORDER — SODIUM CHLORIDE 0.9 % IV SOLN
INTRAVENOUS | Status: DC
  Administered 2018-10-23: 18:00:00 via INTRAVENOUS

## 2018-10-23 MED ORDER — ASPIRIN 325 MG PO TABS
325.0000 mg | ORAL_TABLET | Freq: Every day | ORAL | Status: DC
  Administered 2018-10-23 – 2018-10-25 (×3): 325 mg via ORAL
  Filled 2018-10-23 (×3): qty 1

## 2018-10-23 NOTE — Progress Notes (Signed)
New Admission Note:  Arrival Method: Via [facility] from [facility]  Mental Orientation: alert to self, place, situation and year ; disoriented time month Telemetry:3w07 Assessment: Completed Skin: R elbow abrasion, open scabs on L arm, Edema on both feet  Pain:0/10 Safety Measures: Safety Fall Prevention Plan was given, discussed. Admission: Completed 3W09: Patient has been orientated to the room, unit and the staff. Family: none at bedside   Orders have been reviewed and implemented. Will continue to monitor the patient. Call light has been placed within reach and bed alarm has been activated.   Lawernce Ion ,RN

## 2018-10-23 NOTE — ED Triage Notes (Signed)
Pt states waking up at 0300 symptoms free but started having right sided weakness starting at 0400. EDP in room.

## 2018-10-23 NOTE — Evaluation (Signed)
Physical Therapy Evaluation Patient Details Name: David Mosley MRN: [id number] DOB: 1956/01/14 Today's Date: 10/23/2018   History of Present Illness  Pt is a 62 y/o male admitted secondary to R sided weakness. CT negative for acute abnormality, MRI pending. 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 PMH includes PVD, HTN, and tobacco abuse.   Clinical Impression  Pt admitted secondary to problem above with deficits below. Pt with notable weakness in R extremities and decreased coordination. Felt unsafe to attempt standing with +1 assist given weakness, so deferred until next session. Performed bed mobility with mod A and performed lateral scooting with min A at EOB. Pt was previously independent and feel he would be excellent candidate for CIR given current mobility deficits. Pt very motivated to regain independence. Will continue to follow acutely to maximize functional mobility independence and safety.     Follow Up Recommendations CIR;Supervision for mobility/OOB    Equipment Recommendations  None recommended by PT    Recommendations for Other Services Rehab consult     Precautions / Restrictions Precautions Precautions: Fall;Other (comment) Precaution Comments: Permissive HTN 220/110 Restrictions Weight Bearing Restrictions: No      Mobility  Bed Mobility Overal bed mobility: Needs Assistance Bed Mobility: Supine to Sit;Sit to Supine     Supine to sit: Mod assist Sit to supine: Min assist   General bed mobility comments: Mod A for trunk elevation. Limited ability to assist with RUE. Increased time required. Min A for RLE assist to return to supine.   Transfers Overall transfer level: Needs assistance   Transfers: Lateral/Scoot Transfers          Lateral/Scoot Transfers: Min assist General transfer comment: Did not attempt to stand with +1 assist given RLE weakness, however, did perform lateral scoot along EOB. Required min A for assist with scooting. Heavy reliance  on LUE/LLE to assist with scooting.   Ambulation/Gait                Stairs            Wheelchair Mobility    Modified Rankin (Stroke Patients Only) Modified Rankin (Stroke Patients Only) Pre-Morbid Rankin Score: No symptoms Modified Rankin: Moderately severe disability     Balance Overall balance assessment: Needs assistance Sitting-balance support: Single extremity supported;Feet supported Sitting balance-Leahy Scale: Poor Sitting balance - Comments: R lateral lean with dynamic activities. Reliant on LUE support.                                      Pertinent Vitals/Pain Pain Assessment: Faces Faces Pain Scale: Hurts even more Pain Location: R knee  Pain Descriptors / Indicators: Guarding;Grimacing;Spasm Pain Intervention(s): Limited activity within patient's tolerance;Monitored during session;Repositioned    Home Living Family/patient expects to be discharged to:: Inpatient rehab                      Prior Function Level of Independence: Independent               Hand Dominance        Extremity/Trunk Assessment   Upper Extremity Assessment Upper Extremity Assessment: Defer to OT evaluation;RUE deficits/detail RUE Deficits / Details: Noted decreased grip strength and gross weakness in LUE. Decreased fine motor and gross motor noted as well.  RUE Coordination: decreased fine motor;decreased gross motor    Lower Extremity Assessment Lower Extremity Assessment: RLE deficits/detail RLE  Deficits / Details: 2/5 on ankle DF; 3-/5 on knee extensors and 2+/5 on hip flexors.  RLE Coordination: decreased gross motor    Cervical / Trunk Assessment Cervical / Trunk Assessment: Other exceptions Cervical / Trunk Exceptions: poor dynamic balance in sitting. Tended to lean to R.   Communication   Communication: No difficulties  Cognition Arousal/Alertness: Awake/alert Behavior During Therapy: WFL for tasks  assessed/performed Overall Cognitive Status: Within Functional Limits for tasks assessed                                        General Comments General comments (skin integrity, edema, etc.): Noted R facial droop     Exercises     Assessment/Plan    PT Assessment Patient needs continued PT services  PT Problem List Decreased strength;Decreased balance;Decreased mobility;Decreased knowledge of use of DME;Decreased knowledge of precautions       PT Treatment Interventions DME instruction;Gait training;Functional mobility training;Therapeutic activities;Therapeutic exercise;Balance training;Patient/family education;Neuromuscular re-education    PT Goals (Current goals can be found in the Care Plan section)  Acute Rehab PT Goals Patient Stated Goal: to regain independence  PT Goal Formulation: With patient Time For Goal Achievement: 11/06/18 Potential to Achieve Goals: Good    Frequency Min 4X/week   Barriers to discharge        Co-evaluation               AM-PAC PT "6 Clicks" Daily Activity  Outcome Measure Difficulty turning over in bed (including adjusting bedclothes, sheets and blankets)?: A Lot Difficulty moving from lying on back to sitting on the side of the bed? : Unable Difficulty sitting down on and standing up from a chair with arms (e.g., wheelchair, bedside commode, etc,.)?: Unable Help needed moving to and from a bed to chair (including a wheelchair)?: A Lot Help needed walking in hospital room?: A Lot Help needed climbing 3-5 steps with a railing? : Total 6 Click Score: 9    End of Session Equipment Utilized During Treatment: Gait belt Activity Tolerance: Patient tolerated treatment well Patient left: in bed;with call bell/phone within reach;with bed alarm set;with nursing/sitter in room Nurse Communication: Mobility status PT Visit Diagnosis: Unsteadiness on feet (R26.81);Muscle weakness (generalized) (M62.81)    Time:  5284-1324 PT Time Calculation (min) (ACUTE ONLY): 18 min   Charges:   PT Evaluation $PT Eval Moderate Complexity: 1 Mod          Gladys Damme, PT, DPT  Acute Rehabilitation Services  Pager: [phone] Office: [phone]   Lehman Prom 10/23/2018, 6:47 PM

## 2018-10-23 NOTE — Consult Note (Addendum)
Neurology Consultation  Reason for Consult: Stroke Referring Physician: Sherryll Burger  CC: Left-sided weakness  History is obtained from: Patient  HPI: David Mosley is a 62 y.o. male with history of tobacco abuse, hypertension.  Patient states that patient awoke approximately 330 was feeling fine about 15 minutes after this he was walking his cat to the door when he started to feel very weak.  Patient sat down and continued to feel weaker and weaker and then noted that he could not lift his arm on the right or move his leg as he wanted to on the right.  Also associated some numbness and tingling to his right arm and leg.  He does not take any aspirin.  He states he is gone from 3 packs a day to 3 packs a week of smoking.  ED course-any pain in the ED course was telemetry neurology.  Telemetry neurology was consulted and did not recommend TPA.  Patient had a CT head and a CTA study along with cerebral perfusion that demonstrated normal study without perfusion abnormalities.  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  Was transported to [facility] for [facility] evaluation.  LKW: Between 3 and 4 AM tpa given?: no, tele-neurology did not recommend TPA as he was out of the window and currently he is not an IR candidate as perfusion scans did not show any core or penumbra. Premorbid modified Rankin scale (mRS): 0 NIH stroke scale scale of 5  ROS:  ROS was performed and is negative except as noted in the HPI.   Past Medical History:  Diagnosis Date  . Hypertension   . Hypothyroidism   . Meningitis   . Pneumonia   . Tobacco use      Family History  Problem Relation Age of Onset  . Alzheimer's disease Mother   . Prostate cancer Father   . Arrhythmia Sister     Social History:   reports that he has been smoking cigarettes. He has a 9.75 pack-year smoking history. He has never used smokeless tobacco. He reports that he does not drink alcohol or use drugs.  Medications  Current  Facility-Administered Medications:  .   stroke: mapping our early stages of recovery book, , Does not apply, Once, Sherryll Burger, Pratik D, DO .  0.9 %  sodium chloride infusion, , Intravenous, Continuous, Shah, Pratik D, DO .  acetaminophen (TYLENOL) tablet 650 mg, 650 mg, Oral, Q4H PRN **OR** acetaminophen (TYLENOL) solution 650 mg, 650 mg, Per Tube, Q4H PRN **OR** acetaminophen (TYLENOL) suppository 650 mg, 650 mg, Rectal, Q4H PRN, Sherryll Burger, Pratik D, DO .  aspirin suppository 300 mg, 300 mg, Rectal, Daily **OR** aspirin tablet 325 mg, 325 mg, Oral, Daily, Sherryll Burger, Pratik D, DO .  atorvastatin (LIPITOR) tablet 20 mg, 20 mg, Oral, Daily, Sherryll Burger, Pratik D, DO .  flecainide (TAMBOCOR) tablet 50 mg, 50 mg, Oral, Q12H, Shah, Pratik D, DO .  heparin injection 5,000 Units, 5,000 Units, Subcutaneous, Q8H, Shah, Pratik D, DO .  hydrALAZINE (APRESOLINE) injection 10 mg, 10 mg, Intravenous, Q4H PRN, Sherryll Burger, Pratik D, DO .  [START ON 10/24/2018] levothyroxine (SYNTHROID, LEVOTHROID) tablet 100 mcg, 100 mcg, Oral, QAC breakfast, Sherryll Burger, Pratik D, DO   Exam: Current vital signs: BP (!) 155/87 (BP Location: Left Arm)   Pulse 60   Temp 97.8 F (36.6 C) (Oral)   Resp 16   Ht 5\' 8"  (1.727 m)   Wt 96.2 kg   SpO2 100%   BMI 32.23 kg/m  Vital signs in last 24 hours: Temp:  [97.8 F (36.6 C)-98.1 F (36.7 C)] 97.8 F (36.6 C) (11/06 1503) Pulse Rate:  [52-63] 60 (11/06 1503) Resp:  [11-21] 16 (11/06 1503) BP: (137-173)/(63-91) 155/87 (11/06 1503) SpO2:  [93 %-100 %] 100 % (11/06 1503) Weight:  [96.2 kg] 96.2 kg (11/06 0838)  Physical Exam  Constitutional: Appears well-developed and well-nourished.  Psych: Affect appropriate to situation Eyes: No scleral injection HENT: No OP obstrucion Head: Normocephalic.  Cardiovascular: Normal rate and regular rhythm.  Respiratory: Effort normal, non-labored breathing GI: Soft.  No distension. There is no tenderness.  Skin: WDI  Neuro: Mental Status: Patient is awake,  alert, oriented to person, place, month, year, and situation. Patient is able to give a clear and coherent history. No signs of aphasia or neglect Cranial Nerves: II: Visual Fields are full. Pupils are equal, round, and reactive to light.   III,IV, VI: EOMI without ptosis or diploplia.  V: Facial sensation is symmetric to temperature VII: Right facial droop.  VIII: hearing is intact to voice X: Uvula elevates symmetrically XI: Shoulder shrug is symmetric. XII: tongue is midline without atrophy or fasciculations.  Motor: Left arm and leg have 5/5 strength right arm has 5 4-5 strength in the grip bicep flexion and tricep extension however abduction of his right shoulder is to 4/5.  Flexion on the right along with knee flexion and extension is a 4/5 dorsiflexion is a 2/5 and plantar flexion is a 4/5 Sensory: Any sensory change Deep Tendon Reflexes: 2+ and symmetric in the biceps and patellae.  Plantars: Toes are downgoing bilaterally.  Cerebellar: Normal FNF with the left hand and normal heel-to-shin with the left foot.  Unable to do finger-to-nose with the right hand and or left leg   Labs I have reviewed labs in epic and the results pertinent to this consultation are:   CBC    Component Value Date/Time   WBC 9.0 10/23/2018 0840   RBC 4.57 10/23/2018 0840   HGB 13.9 10/23/2018 0845   HCT 41.0 10/23/2018 0845   PLT 179 10/23/2018 0840   MCV 91.9 10/23/2018 0840   MCH 30.0 10/23/2018 0840   MCHC 32.6 10/23/2018 0840   RDW 13.2 10/23/2018 0840   LYMPHSABS 2.0 10/23/2018 0840   MONOABS 0.9 10/23/2018 0840   EOSABS 0.5 10/23/2018 0840   BASOSABS 0.1 10/23/2018 0840    CMP     Component Value Date/Time   NA 137 10/23/2018 0845   K 3.7 10/23/2018 0845   CL 101 10/23/2018 0845   CO2 25 10/23/2018 0840   GLUCOSE 119 (H) 10/23/2018 0845   BUN 12 10/23/2018 0845   CREATININE 1.30 (H) 10/23/2018 0845   CREATININE 1.40 (H) 02/23/2017 1125   CALCIUM 8.8 (L) 10/23/2018 0840    PROT 7.6 10/23/2018 0840   ALBUMIN 3.9 10/23/2018 0840   AST 23 10/23/2018 0840   ALT 21 10/23/2018 0840   ALKPHOS 41 10/23/2018 0840   BILITOT 0.8 10/23/2018 0840   GFRNONAA 60 (L) 10/23/2018 0840   GFRNONAA 72 04/20/2016 1411   GFRAA >60 10/23/2018 0840   GFRAA 83 04/20/2016 1411    Lipid Panel     Component Value Date/Time   CHOL 177 08/09/2018 1242   TRIG 95 08/09/2018 1242   HDL 33 (L) 08/09/2018 1242   CHOLHDL 5.4 08/09/2018 1242   VLDL 19 08/09/2018 1242   LDLCALC 125 (H) 08/09/2018 1242     Imaging I have reviewed the images obtained:  CT-scan of the brain- no hemorrhage or visible infarct within aspects of 10  -CTA of head and neck-negative for large vessel occlusion.  No left MCA branch occlusion or stenosis is evident.  Suspect bulky soft plaque at the left ICA origin and bulb with high-grade left ICA origin stenosis.  It is approaching a radiographic string sign. Vs artifact  MRI examination of the brain -would be obtained  Felicie Morn PA-C [facility] [phone]  M-F  (9:00 am- 5:00 PM)  10/23/2018, 3:59 PM    Assessment:  This is a 62 year old male with sudden onset of right sided upper extremity weakness greater than lower extremity weakness and tingling sensation.  Although perfusion scan did not show a core or penumbra exam does consist of a likely stroke.  I believe an MRI is definitely warranted to evaluate for further evaluation of stroke.   Impression:  Likely stroke - small vessel. 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  Recommendations: # MRI of the brain without contrast  #Transthoracic Echo,  # Start patient on ASA 325mg  daily,  #Start  Atorvastatin 80 mg/other high intensity statin # BP goal: permissive HTN upto 220/120 mmHg # HBAIC and Lipid profile # Telemetry monitoring # Frequent neuro checks # NPO until passes stroke swallow screen #Carotid US # please page stroke NP  Or  PA  Or  MD from 8am -4 pm  as this patient from this time will be  followed by the stroke.   You can look them up on [email or web address]  Password [facility]   Attending Neurohospitalist Addendum Patient seen and examined with APP/Resident. Agree with the history and physical as documented above. Agree with the plan as documented, which I helped formulate. I have independently reviewed the chart, obtained history, review of systems and examined the patient.I have personally reviewed pertinent head/neck/spine imaging (CT/MRI). Please feel free to call with any questions. --- Milon Dikes, MD [facility] Pager: [phone]  If 7pm to 7am, please call on call as listed on AMION.

## 2018-10-23 NOTE — ED Notes (Signed)
ED Provider at bedside. 

## 2018-10-23 NOTE — H&P (Addendum)
History and Physical    Enis Riecke ZOX:[id number] DOB: 01-24-1956 DOA: 10/23/2018  PCP: Jacquelin Hawking, PA-C   Patient coming from: Home  Chief Complaint: R-sided weakness  HPI: David Mosley is a 62 y.o. male with medical history significant for prior meningitis, hyperlipidemia, hypertension, hypothyroidism, peripheral vascular disease, and ongoing tobacco abuse who presented with significant right-sided weakness of onset that occurred approximately between 3 and 4 AM.  He states that he usually wakes up at around 3 in the morning and was doing well at that time and came downstairs to pet his cat and fell asleep shortly after only to wake up around 4 to 4:30 AM with significant weakness to his right lower extremity that is greater than his upper extremity.  He also had some mild associated numbness and tingling with no known headache, visual deficit, or speech deficits.  He states that he takes his medications as otherwise prescribed and does continue to smoke on a daily basis.   ED Course: Tele-neurology was consulted and did not recommend TPA.  He had multiple CT head and CTA studies along with cerebral perfusion that demonstrated a normal study without perfusion abnormalities.  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  Patient was recommended to have inpatient neurology evaluation and EDP had spoken with Dr. Laurence Slate who states that they will see after patient is transferred.  Patient is already beginning to have some improvement to his right upper extremity weakness but no changes to the lower extremity as of yet.  Review of Systems: All others reviewed and otherwise negative.  Past Medical History:  Diagnosis Date  . Hypertension   . Hypothyroidism   . Meningitis   . Pneumonia   . Tobacco use     Past Surgical History:  Procedure Laterality Date  . NO PAST SURGERIES       reports that he has been smoking  cigarettes. He has a 9.75 pack-year smoking history. He has never used smokeless tobacco. He reports that he does not drink alcohol or use drugs.  Allergies  Allergen Reactions  . Amlodipine Nausea Only, Anxiety and Palpitations  . Penicillins Itching    Has patient had a PCN reaction causing immediate rash, facial/tongue/throat swelling, SOB or lightheadedness with hypotension: No Has patient had a PCN reaction causing severe rash involving mucus membranes or skin necrosis: No Has patient had a PCN reaction that required hospitalization: No Has patient had a PCN reaction occurring within the last 10 years: No\ If all of the above answers are "NO", then may proceed with Cephalosporin use.    Family History  Problem Relation Age of Onset  . Alzheimer's disease Mother   . Prostate cancer Father   . Arrhythmia Sister     Prior to Admission medications   Medication Sig Start Date End Date Taking? Authorizing Provider  atorvastatin (LIPITOR) 20 MG tablet Take 1 tablet (20 mg total) by mouth daily. 10/15/18  Yes Jacquelin Hawking, PA-C  diltiazem (CARDIZEM CD) 180 MG 24 hr capsule Take 1 capsule (180 mg total) by mouth daily. 05/01/18  Yes Marinus Maw, MD  flecainide (TAMBOCOR) 50 MG tablet Take 1 tablet (50 mg total) by mouth every 12 (twelve) hours. 05/01/18  Yes Marinus Maw, MD  levothyroxine (SYNTHROID, LEVOTHROID) 100 MCG tablet Take 1 tablet (100 mcg total) by mouth daily. 10/15/18  Yes Jacquelin Hawking, PA-C  losartan (COZAAR) 50 MG tablet Take 1 tablet (  50 mg total) by mouth daily. 10/15/18  Yes Jacquelin Hawking, PA-C  metoprolol tartrate (LOPRESSOR) 25 MG tablet Take 1 tablet (25 mg total) by mouth 2 (two) times daily. 10/15/18  Yes Jacquelin Hawking, PA-C    Physical Exam: Vitals:   10/23/18 1050 10/23/18 1100 10/23/18 1110 10/23/18 1200  BP: (!) 142/72 137/65 (!) 141/74 (!) 148/79  Pulse: (!) 56 (!) 52 (!) 56 (!) 58  Resp: 16 11 14    Temp:      TempSrc:      SpO2: 99%  97% 99% 97%  Weight:      Height:        Constitutional: NAD, calm, comfortable Vitals:   10/23/18 1050 10/23/18 1100 10/23/18 1110 10/23/18 1200  BP: (!) 142/72 137/65 (!) 141/74 (!) 148/79  Pulse: (!) 56 (!) 52 (!) 56 (!) 58  Resp: 16 11 14    Temp:      TempSrc:      SpO2: 99% 97% 99% 97%  Weight:      Height:       Eyes: lids and conjunctivae normal ENMT: Mucous membranes are moist.  Neck: normal, supple Respiratory: clear to auscultation bilaterally. Normal respiratory effort. No accessory muscle use.  Currently on 1 L nasal cannula. Cardiovascular: Regular rate and rhythm, no murmurs. No extremity edema. Abdomen: no tenderness, no distention. Bowel sounds positive.  Musculoskeletal:  No joint deformity upper and lower extremities.   Skin: no rashes, lesions, ulcers.  Psychiatric: Normal judgment and insight. Alert and oriented x 3. Normal mood.  Neurological: Cranial nerves II through XII appear to be grossly intact.  Grip strength to right upper extremity diminished compared to left upper extremity.  Bilateral lower extremities appear to be weak on dorsiflexion and plantarflexion.  Labs on Admission: I have personally reviewed following labs and imaging studies  CBC: Recent Labs  Lab 10/23/18 0840 10/23/18 0845  WBC 9.0  --   NEUTROABS 5.5  --   HGB 13.7 13.9  HCT 42.0 41.0  MCV 91.9  --   PLT 179  --    Basic Metabolic Panel: Recent Labs  Lab 10/23/18 0840 10/23/18 0845  NA 136 137  K 3.6 3.7  CL 104 101  CO2 25  --   GLUCOSE 122* 119*  BUN 12 12  CREATININE 1.26* 1.30*  CALCIUM 8.8*  --    GFR: Estimated Creatinine Clearance: 67.1 mL/min (A) (by C-G formula based on SCr of 1.3 mg/dL (H)). Liver Function Tests: Recent Labs  Lab 10/23/18 0840  AST 23  ALT 21  ALKPHOS 41  BILITOT 0.8  PROT 7.6  ALBUMIN 3.9   No results for input(s): LIPASE, AMYLASE in the last 168 hours. No results for input(s): AMMONIA in the last 168 hours. Coagulation  Profile: Recent Labs  Lab 10/23/18 0840  INR 0.98   Cardiac Enzymes: No results for input(s): CKTOTAL, CKMB, CKMBINDEX, TROPONINI in the last 168 hours. BNP (last 3 results) No results for input(s): PROBNP in the last 8760 hours. HbA1C: No results for input(s): HGBA1C in the last 72 hours. CBG: Recent Labs  Lab 10/23/18 0837  GLUCAP 115*   Lipid Profile: No results for input(s): CHOL, HDL, LDLCALC, TRIG, CHOLHDL, LDLDIRECT in the last 72 hours. Thyroid Function Tests: Recent Labs    10/23/18 0853  TSH 4.456   Anemia Panel: No results for input(s): VITAMINB12, FOLATE, FERRITIN, TIBC, IRON, RETICCTPCT in the last 72 hours. Urine analysis:    Component Value Date/Time  COLORURINE YELLOW 10/23/2018 0841   APPEARANCEUR CLEAR 10/23/2018 0841   LABSPEC 1.010 10/23/2018 0841   PHURINE 6.0 10/23/2018 0841   GLUCOSEU NEGATIVE 10/23/2018 0841   HGBUR NEGATIVE 10/23/2018 0841   BILIRUBINUR NEGATIVE 10/23/2018 0841   KETONESUR NEGATIVE 10/23/2018 0841   PROTEINUR NEGATIVE 10/23/2018 0841   NITRITE NEGATIVE 10/23/2018 0841   LEUKOCYTESUR NEGATIVE 10/23/2018 0841    Radiological Exams on Admission: Ct Angio Head W/cm &/or Wo Cm  Result Date: 10/23/2018 CLINICAL DATA:  62 year old code stroke presentation. Right side weakness since 0400 hours. EXAM: CT ANGIOGRAPHY HEAD AND NECK TECHNIQUE: Multidetector CT imaging of the head and neck was performed using the standard protocol during bolus administration of intravenous contrast. Multiplanar CT image reconstructions and MIPs were obtained to evaluate the vascular anatomy. Carotid stenosis measurements (when applicable) are obtained utilizing NASCET criteria, using the distal internal carotid diameter as the denominator. CONTRAST:  75mL ISOVUE-370 IOPAMIDOL (ISOVUE-370) INJECTION 76% COMPARISON:  Head CT without contrast 0846 hours today. FINDINGS: CTA NECK Skeleton: Absent dentition. Lower cervical spine disc and endplate degeneration.  No acute osseous abnormality identified. 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 The left upper extremity was injected such that there is prominent reflux of venous contrast into the neck and as far as the left sigmoid sinus. Prominent paravertebral contrast reflux also. Mild upper lung atelectasis. Visible mediastinal lymph nodes are within normal limits. Other neck: Negative. No neck mass or lymphadenopathy. Aortic arch: Mild to moderate aortic arch atherosclerosis. Bovine type arch configuration (normal variant). Right carotid system: No brachiocephalic or right CCA origin stenosis despite some atherosclerosis. Similar soft plaque in the distal right CCA with no stenosis through the right carotid bifurcation. Calcified plaque in the posterior right ICA bulb without stenosis. Tortuous right ICA just below the skull base. Left carotid system: Calcified plaque at the bovine type left CCA origin with no significant stenosis. Prominent left IJ venous contrast reflux and streak artifact mildly degrades detail of the cervical left carotid. There is evidence of bulky soft plaque in the left ICA origin and bulb on series 10, image 134 with suspected high-grade left ICA origin stenosis approaching a radiographic string sign. The left ICA remains patent to the skull base with mild additional calcified plaque, no additional stenosis. Vertebral arteries: No proximal right subclavian artery or right vertebral artery origin stenosis. Right vertebral artery detail in the neck limited by paravertebral venous contrast reflux. No right vertebral artery stenosis to the skull base is evident. No proximal left subclavian artery or left vertebral artery origin stenosis despite some atherosclerosis. Limited left vertebral detail in the neck due to prominent venous contrast reflux. The left vertebral is patent to the skull base with no stenosis evident. CTA HEAD Suboptimal  intracranial arterial contrast bolus, probably related to the innominate vein narrowing and venous reflux described above. Posterior circulation: Bilateral V4 segment calcified plaque without stenosis. Codominant distal vertebral arteries. Patent PICA origins and vertebrobasilar junction. No basilar stenosis. Mild basilar ectasia. Normal SCA and PCA origins. Posterior communicating arteries are diminutive or absent. Bilateral PCA branches are within normal limits. Anterior circulation: Both ICA siphons are patent with symmetric enhancement. Both siphons are tortuous, ectatic especially in the cavernous segments. Bilateral siphon calcified plaque but no siphon stenosis. Normal ophthalmic artery origins. Patent carotid termini. Normal MCA and ACA origins. Anterior communicating artery and bilateral ACA branches are within normal limits. Right MCA M1 segment, bifurcation, and right MCA branches are  within normal limits. Left MCA M1 segment, bifurcation, and left MCA branches are within normal limits. No left MCA branch occlusion is identified to correspond to the question Vessel hyperdensity by CT today. Venous sinuses: Patent. Prominent reflux of the injected contrast into the left IJ and even into the left sigmoid sinus. Anatomic variants: Bovine aortic arch configuration. Delayed phase: No abnormal enhancement identified. Gray-white matter differentiation is stable since 0846 hours and within normal limits. Review of the MIP images confirms the above findings IMPRESSION: 1. Negative for large vessel occlusion. No left MCA branch occlusion or stenosis is evident. 2. Suspected bulky soft plaque at the Left ICA origin and bulb with High-Grade Left ICA origin stenosis approaching a RADIOGRAPHIC STRING SIGN. However, substantial venous contrast reflux in the left neck (related to anatomic left innominate vein stenosis) with streak artifact does limit detail of the left carotid. Therefore corroboration of the high-grade  stenosis with Carotid Doppler US may be valuable. 3. No other hemodynamically significant stenosis in the head or neck. Generalized arterial tortuosity. 4. Stable CT appearance of the brain since 0846 hours today. Study discussed by telephone with Dr. Vanetta Mulders on 10/23/2018 at 10:16 . Electronically Signed   By: Odessa Fleming M.D.   On: 10/23/2018 10:18   Ct Angio Neck W And/or Wo Contrast  Result Date: 10/23/2018 CLINICAL DATA:  62 year old code stroke presentation. Right side weakness since 0400 hours. EXAM: CT ANGIOGRAPHY HEAD AND NECK TECHNIQUE: Multidetector CT imaging of the head and neck was performed using the standard protocol during bolus administration of intravenous contrast. Multiplanar CT image reconstructions and MIPs were obtained to evaluate the vascular anatomy. Carotid stenosis measurements (when applicable) are obtained utilizing NASCET criteria, using the distal internal carotid diameter as the denominator. CONTRAST:  75mL ISOVUE-370 IOPAMIDOL (ISOVUE-370) INJECTION 76% COMPARISON:  Head CT without contrast 0846 hours today. FINDINGS: CTA NECK Skeleton: Absent dentition. Lower cervical spine disc and endplate degeneration. No acute osseous abnormality identified. 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 The left upper extremity was injected such that there is prominent reflux of venous contrast into the neck and as far as the left sigmoid sinus. Prominent paravertebral contrast reflux also. Mild upper lung atelectasis. Visible mediastinal lymph nodes are within normal limits. Other neck: Negative. No neck mass or lymphadenopathy. Aortic arch: Mild to moderate aortic arch atherosclerosis. Bovine type arch configuration (normal variant). Right carotid system: No brachiocephalic or right CCA origin stenosis despite some atherosclerosis. Similar soft plaque in the distal right CCA with no stenosis through the right carotid bifurcation.  Calcified plaque in the posterior right ICA bulb without stenosis. Tortuous right ICA just below the skull base. Left carotid system: Calcified plaque at the bovine type left CCA origin with no significant stenosis. Prominent left IJ venous contrast reflux and streak artifact mildly degrades detail of the cervical left carotid. There is evidence of bulky soft plaque in the left ICA origin and bulb on series 10, image 134 with suspected high-grade left ICA origin stenosis approaching a radiographic string sign. The left ICA remains patent to the skull base with mild additional calcified plaque, no additional stenosis. Vertebral arteries: No proximal right subclavian artery or right vertebral artery origin stenosis. Right vertebral artery detail in the neck limited by paravertebral venous contrast reflux. No right vertebral artery stenosis to the skull base is evident. No proximal left subclavian artery or left vertebral artery origin stenosis despite some atherosclerosis. Limited left vertebral  detail in the neck due to prominent venous contrast reflux. The left vertebral is patent to the skull base with no stenosis evident. CTA HEAD Suboptimal intracranial arterial contrast bolus, probably related to the innominate vein narrowing and venous reflux described above. Posterior circulation: Bilateral V4 segment calcified plaque without stenosis. Codominant distal vertebral arteries. Patent PICA origins and vertebrobasilar junction. No basilar stenosis. Mild basilar ectasia. Normal SCA and PCA origins. Posterior communicating arteries are diminutive or absent. Bilateral PCA branches are within normal limits. Anterior circulation: Both ICA siphons are patent with symmetric enhancement. Both siphons are tortuous, ectatic especially in the cavernous segments. Bilateral siphon calcified plaque but no siphon stenosis. Normal ophthalmic artery origins. Patent carotid termini. Normal MCA and ACA origins. Anterior communicating  artery and bilateral ACA branches are within normal limits. Right MCA M1 segment, bifurcation, and right MCA branches are within normal limits. Left MCA M1 segment, bifurcation, and left MCA branches are within normal limits. No left MCA branch occlusion is identified to correspond to the question Vessel hyperdensity by CT today. Venous sinuses: Patent. Prominent reflux of the injected contrast into the left IJ and even into the left sigmoid sinus. Anatomic variants: Bovine aortic arch configuration. Delayed phase: No abnormal enhancement identified. Gray-white matter differentiation is stable since 0846 hours and within normal limits. Review of the MIP images confirms the above findings IMPRESSION: 1. Negative for large vessel occlusion. No left MCA branch occlusion or stenosis is evident. 2. Suspected bulky soft plaque at the Left ICA origin and bulb with High-Grade Left ICA origin stenosis approaching a RADIOGRAPHIC STRING SIGN. However, substantial venous contrast reflux in the left neck (related to anatomic left innominate vein stenosis) with streak artifact does limit detail of the left carotid. Therefore corroboration of the high-grade stenosis with Carotid Doppler US may be valuable. 3. No other hemodynamically significant stenosis in the head or neck. Generalized arterial tortuosity. 4. Stable CT appearance of the brain since 0846 hours today. Study discussed by telephone with Dr. Vanetta Mulders on 10/23/2018 at 10:16 . Electronically Signed   By: Odessa Fleming M.D.   On: 10/23/2018 10:18   Ct Cerebral Perfusion W Contrast  Result Date: 10/23/2018 CLINICAL DATA:  Arterial stricture. EXAM: CT PERFUSION BRAIN TECHNIQUE: Multiphase CT imaging of the brain was performed following IV bolus contrast injection. Subsequent parametric perfusion maps were calculated using RAPID software. CONTRAST:  40mL ISOVUE-370 IOPAMIDOL (ISOVUE-370) INJECTION 76% COMPARISON:  CTA head neck from earlier today FINDINGS: CT Brain  Perfusion Findings: CBF (<30%) Volume: 0mL Perfusion (Tmax>6.0s) volume: 0mL ASPECTS on noncontrast CT Head: 10 at 8:46 a.m. today. IMPRESSION: Negative CT perfusion - no infarct or penumbra. Electronically Signed   By: Marnee Spring M.D.   On: 10/23/2018 11:51   Ct Head Code Stroke Wo Contrast  Result Date: 10/23/2018 CLINICAL DATA:  Code stroke. Right-sided weakness starting at 4 a.m. EXAM: CT HEAD WITHOUT CONTRAST TECHNIQUE: Contiguous axial images were obtained from the base of the skull through the vertex without intravenous contrast. COMPARISON:  None. FINDINGS: Brain: No evidence of acute infarction, hemorrhage, hydrocephalus, extra-axial collection or mass lesion/mass effect. Mild cerebral volume loss. Vascular: Hyperdense focus along the lower left sylvian fissure, not seen on reformats. Skull: No acute or aggressive finding. Sinuses/Orbits: Gaze is to the right. Other: These results were called by telephone at the time of interpretation on 10/23/2018 at 8:59 am to Dr. Vanetta Mulders , who verbally acknowledged these results. ASPECTS [facility] Stroke Program Early CT Score) - Ganglionic level  infarction (caudate, lentiform nuclei, internal capsule, insula, M1-M3 cortex): 7 - Supraganglionic infarction (M4-M6 cortex): 3 Total score (0-10 with 10 being normal): 10 IMPRESSION: 1. No hemorrhage or visible infarct.ASPECTS is 10. 2. Equivocal hyperdense focus in the left sylvian fissure, cannot exclude M2/3 branch thrombosis. Electronically Signed   By: Marnee Spring M.D.   On: 10/23/2018 09:01    EKG: Independently reviewed. SR 65bpm.  Assessment/Plan Principal Problem:   CVA (cerebral vascular accident) (HCC) Active Problems:   PVD (peripheral vascular disease) (HCC)   Cigarette nicotine dependence with nicotine-induced disorder   Essential hypertension, benign   Hypothyroidism    1. Acute right-sided hemiparesis suspicious for TIA/CVA.  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  The location of the stenosis may correspond with the right-sided weakness and will need further neurological evaluation for which she will be transferred to neuro telemetry at [facility].  Allow permissive hypertension and follow stroke protocol with carotid ultrasound as well as 2D echocardiogram, fasting lipid panel, and hemoglobin A1c.  Further imaging studies per neurology.  Okay to start cardiac diet and maintain on normal saline IV infusion.  Consult to PT/OT.  Start on full dose aspirin and maintain on home statin. 2. Essential hypertension.  Allow permissive hypertension up to 220/110 and hold blood pressure medications for now. 3. History of PSVT.  Maintain on flecainide for now and monitor on telemetry with good rate control noted.  EKG reviewed with no changes. 4. Dyslipidemia.  Maintain on home statin dose and check fasting lipid profile. 5. Hypothyroidism.  Maintain on levothyroxine 100 mcg daily and check TSH. 6. CKD stage II-III.  Appears to have prior baseline creatinine of 1.2 is currently 1.3.  Monitor with repeat labs in a.m.   DVT prophylaxis: Heparin Code Status: Full Family Communication: Sister and Friend at bedside Disposition Plan:Tranfer to [facility] for Neurology Evaluation Consults called:EDP Dr. Deretha Emory has informed Dr. Laurence Slate of Neurology Admission status: Inpatient, Tele   Tasean Mancha Hoover Brunette DO [facility] Pager [phone]  If 7PM-7AM, please contact night-coverage [email or web address] Password [email or web address]  10/23/2018, 1:29 PM

## 2018-10-23 NOTE — ED Notes (Signed)
No TPA given at this time.  

## 2018-10-23 NOTE — ED Notes (Signed)
Family at bedside. 

## 2018-10-23 NOTE — Consult Note (Signed)
[facility] TeleNeurology Consult Services   Date of Service:   10/23/2018 08:48:30  Impression:     .  RO Acute Ischemic Stroke     .  Left Hemispheric     .  POssible M2 thrombus/M3 thrombus on CTH noncontrast  Comments: Patient is 62 yo LH male HLD, HTN, hypothyroidism, h/o meninigitis who presents with right sided weakness. LKN 3:00am. CTH no ICH, possible left M2/M3 thrombus. STAT CTA head and neck are pending.  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 Likely symptomatic critical stenosis. Transfer center called to review case with NIR. ED MD made aware. Rec ASA, HOB flat and IVF for perfusion.   CTA commpleted 9:52, resulted 10:24.    Spoke with Neurologist at [facility], CT perfusion recommended. Unclear why it was not initially completed with CTA stroke protocol. Patient to go for STAT imaging, spoke with ED MD.  Addendum: 12:00. CT perfusion reviewed with radiology. Normal study without perfusion abnormalities. Transfer center called back. Note CTA was limited technically, per review with radiology. Suspected high gMetrics: Last Known Well: 10/23/2018 03:00:00 [facility] Notification Time: 10/23/2018 08:47:08 Arrival Time: 10/23/2018 08:37:00 Stamp Time: 10/23/2018 08:48:30 Time First Login Attempt: 10/23/2018 08:53:27 Video Start Time: 10/23/2018 08:53:27  Symptoms: right sided weakness NIHSS Start Assessment Tirade stenosis left ICA, without corresponding penumbra or core infarct. Nined.   Radiologist was not called back for review of advanced imaging because Pending CTA head and neck ER Physician notified of the decision on thrombolytics management on 10/23/2018 09:04:59  Our recommendations are outlined below.  Recommendations:     .  Activate Stroke Protocol Admission/Order Set     .  Stroke/Telemetry Floor     .  Neuro Checks     .  Bedside Swallow Eval     .  DVT Prophylaxis     .  IV Fluids,  Normal Saline     .  Head of Bed Below 30 Degrees     .  Euglycemia and Avoid Hyperthermia (PRN Acetaminophen)     .  Initiate Aspirin 325 MG Daily  Routine Consultation with Inhouse Neurology for Follow up Care  Sign Out:     .  Discussed with Emergency Department Provider    ------------------------------------------------------------------------------  History of Present Illness:  Patient was brought by EMS for symptoms of right sided weakness  Patient is 62 yo LH male HLD, HTN, hypothyroidism, h/o meninigitis who presents with right sided weakness. LKN 3:00am. No history of prior stroke. He does not take ASA.  CT head showed no acute hemorrhage or acute core infarct. CT head was reviewed.  Last seen normal was beyond 4.5 hours of presentation. There is no history of hemorrhagic complications or intracranial hemorrhage. There is no history of Recent Anticoagulants. There is no history of recent major surgery. There is no history of recent stroke.  Examination: 1A: Level of Consciousness - Alert; keenly responsive + 0 1B: Ask Month and Age - Both Questions Right + 0 1C: Blink Eyes & Squeeze Hands - Performs Both Tasks + 0 2: Test Horizontal Extraocular Movements - Normal + 0 3: Test Visual Fields - No Visual Loss + 0 4: Test Facial Palsy (Use Grimace if Obtunded) - Minor paralysis (flat nasolabial fold, smile asymetry) + 1 5A: Test Left Arm Motor Drift - No Drift for 10 Seconds + 0 5B: Test Right Arm Motor Drift - Some Effort Against Gravity + 2 6A: Test Left Leg  Motor Drift - No Drift for 5 Seconds + 0IR not indicated. Per MD at transfer center he will call ED MD.  Mechanism of Stroke: Not Clear  me: 10/23/2018 08:55:02 Patient is not a candidate for tPA. Patient was not deemed candidate for tPA thrombolytics because of LKN > 4.5 hours . Video End Time: 10/23/2018 09:12:09  CT head showed no acute hemorrhage or acute core infarct. CT head was reviewed.  Advanced  imaging CTA head and neck obtained. Advanced imaging CTP obta 6B: Test Right Leg Motor Drift - Some Effort Against Gravity + 2 7: Test Limb Ataxia (FNF/Heel-Shin) - No Ataxia + 0 8: Test Sensation - Normal; No sensory loss + 0 9: Test Language/Aphasia - Normal; No aphasia + 0 10: Test Dysarthria - Normal + 0 11: Test Extinction/Inattention - No abnormality + 0  NIHSS Score: 5  Patient was informed the Neurology Consult would happen via TeleHealth consult by way of interactive audio and video telecommunications and consented to receiving care in this manner.  Due to the immediate potential for life-threatening deterioration due to underlying acute neurologic illness, I spent 35 minutes providing critical care. This time includes time for face to face visit via telemedicine, review of medical records, imaging studies and discussion of findings with providers, the patient and/or family.   Dr Jennefer Bravo   [facility] [phone]

## 2018-10-23 NOTE — Progress Notes (Signed)
CODE STROKE 0836 CALL TIME 0845 BEEPER TIME 0846 EXAM FINISHED 0850 EXAM COMPLETE  0850 [facility]

## 2018-10-23 NOTE — ED Provider Notes (Signed)
[facility] EMERGENCY DEPARTMENT Provider Note   CSN: [id number] Arrival date & time: 10/23/18  4098   An emergency department physician performed an initial assessment on this suspected stroke patient at [phone].  History   Chief Complaint Chief Complaint  Patient presents with  . Weakness    HPI David Mosley is a 62 y.o. male.  Patient brought in by family members.  Patient was awake at 3 in the morning and everything was fine he got up to use the bathroom.  And then when he awoke again at 4 in the morning he noticed significant right leg and right arm weakness.  No speech problems.  Patient actually fell took a while to get to the phone he had to crawl to get to that.  And call family members which brought him in.  He arrived here at about 5 and half hours from last time normal.  Did have not complete right lower extremity weakness but very significant and had some less weakness to his right upper extremity.  Speech seemed to be fine no complaint of headache.  Patient has a history of hypertension.  No history of strokes not on blood thinners.  Based on this presentation code stroke was initiated although he was out of the window for TPA.  Patient did not seem to meet command criteria.  However it did appear as if there was a large vessel CVA.     Past Medical History:  Diagnosis Date  . Hypertension   . Hypothyroidism   . Meningitis   . Pneumonia   . Tobacco use     Patient Active Problem List   Diagnosis Date Noted  . Wide-complex tachycardia (HCC) 03/28/2018  . Hypothyroidism 03/28/2018  . Elevated AST (SGOT) 03/28/2018  . Chest pain 03/28/2018  . Tobacco use 03/28/2018  . Sleep apnea 03/28/2018  . Paroxysmal ventricular tachycardia (HCC)   . PSVT (paroxysmal supraventricular tachycardia) (HCC) 03/26/2018  . Essential hypertension, benign 05/29/2016  . Thyroid activity decreased 03/21/2016  . Edema 03/21/2016  . PVD (peripheral vascular disease) (HCC) 03/21/2016  .  Cigarette nicotine dependence with nicotine-induced disorder 03/21/2016    Past Surgical History:  Procedure Laterality Date  . NO PAST SURGERIES          Home Medications    Prior to Admission medications   Medication Sig Start Date End Date Taking? Authorizing Provider  atorvastatin (LIPITOR) 20 MG tablet Take 1 tablet (20 mg total) by mouth daily. 10/15/18   Jacquelin Hawking, PA-C  diltiazem (CARDIZEM CD) 180 MG 24 hr capsule Take 1 capsule (180 mg total) by mouth daily. 05/01/18   Marinus Maw, MD  flecainide (TAMBOCOR) 50 MG tablet Take 1 tablet (50 mg total) by mouth every 12 (twelve) hours. 05/01/18   Marinus Maw, MD  levothyroxine (SYNTHROID, LEVOTHROID) 100 MCG tablet Take 1 tablet (100 mcg total) by mouth daily. 10/15/18   Jacquelin Hawking, PA-C  losartan (COZAAR) 50 MG tablet Take 1 tablet (50 mg total) by mouth daily. 10/15/18   Jacquelin Hawking, PA-C  metoprolol tartrate (LOPRESSOR) 25 MG tablet Take 1 tablet (25 mg total) by mouth 2 (two) times daily. 10/15/18   Jacquelin Hawking, PA-C  triamcinolone (KENALOG) 0.025 % ointment Apply 1 application topically 2 (two) times daily. 06/06/18   Jacquelin Hawking, PA-C    Family History Family History  Problem Relation Age of Onset  . Alzheimer's disease Mother   . Prostate cancer Father   . Arrhythmia Sister  Social History Social History   Tobacco Use  . Smoking status: Current Every Day Smoker    Packs/day: 0.25    Years: 39.00    Pack years: 9.75    Types: Cigarettes  . Smokeless tobacco: Never Used  Substance Use Topics  . Alcohol use: No    Comment: previous heavy drinker. quit 2001  . Drug use: No    Types: Marijuana     Allergies   Amlodipine and Penicillins   Review of Systems Review of Systems  Constitutional: Negative for fatigue and fever.  HENT: Negative for congestion.   Eyes: Negative for redness.  Respiratory: Negative for shortness of breath.   Cardiovascular: Negative for chest  pain.  Gastrointestinal: Negative for abdominal pain.  Genitourinary: Negative for dysuria.  Musculoskeletal: Negative for back pain and neck pain.  Neurological: Positive for weakness and numbness. Negative for dizziness, syncope, facial asymmetry, speech difficulty and headaches.  Hematological: Does not bruise/bleed easily.  Psychiatric/Behavioral: Negative for confusion.     Physical Exam Updated Vital Signs BP (!) 141/74   Pulse (!) 56   Temp 98.1 F (36.7 C) (Oral)   Resp 14   Ht 1.727 m (5\' 8" )   Wt 96.2 kg   SpO2 99%   BMI 32.23 kg/m   Physical Exam  Constitutional: He is oriented to person, place, and time. He appears well-developed and well-nourished. No distress.  HENT:  Head: Normocephalic and atraumatic.  Right Ear: External ear normal.  Left Ear: External ear normal.  Mouth/Throat: Oropharynx is clear and moist.  Eyes: Pupils are equal, round, and reactive to light. Conjunctivae and EOM are normal.  Neck: Normal range of motion. Neck supple.  Cardiovascular: Normal rate, regular rhythm and normal heart sounds.  Pulmonary/Chest: Effort normal and breath sounds normal.  Abdominal: Soft. Bowel sounds are normal.  Musculoskeletal: Normal range of motion. He exhibits no edema.  Neurological: He is alert and oriented to person, place, and time. A sensory deficit is present. No cranial nerve deficit. He exhibits abnormal muscle tone.  She was significant right lower extremity weakness unable to raise the leg off the gurney against gravity.  Able to raise the right arm some.  Weakness there probably 4 out of 5.  No cranial nerve deficits.  No speech problems.  Left side normal.  Patient left hand dominant.  Skin: Skin is warm.  Nursing note and vitals reviewed.    ED Treatments / Results  Labs (all labs ordered are listed, but only abnormal results are displayed) Labs Reviewed  COMPREHENSIVE METABOLIC PANEL - Abnormal; Notable for the following components:       Result Value   Glucose, Bld 122 (*)    Creatinine, Ser 1.26 (*)    Calcium 8.8 (*)    GFR calc non Af Amer 60 (*)    All other components within normal limits  I-STAT CHEM 8, ED - Abnormal; Notable for the following components:   Creatinine, Ser 1.30 (*)    Glucose, Bld 119 (*)    All other components within normal limits  ETHANOL  PROTIME-INR  APTT  CBC  DIFFERENTIAL  RAPID URINE DRUG SCREEN, HOSP PERFORMED  URINALYSIS, ROUTINE W REFLEX MICROSCOPIC  TSH  I-STAT TROPONIN, ED    EKG EKG Interpretation  Date/Time:  Wednesday October 23 2018 08:38:45 EST Ventricular Rate:  65 PR Interval:    QRS Duration: 103 QT Interval:  403 QTC Calculation: 419 R Axis:   56 Text Interpretation:  Sinus rhythm  Prolonged PR interval No significant change since last tracing Confirmed by Vanetta Mulders [phone]) on 10/23/2018 8:42:46 AM   Radiology Ct Angio Head W/cm &/or Wo Cm  Result Date: 10/23/2018 CLINICAL DATA:  62 year old code stroke presentation. Right side weakness since 0400 hours. EXAM: CT ANGIOGRAPHY HEAD AND NECK TECHNIQUE: Multidetector CT imaging of the head and neck was performed using the standard protocol during bolus administration of intravenous contrast. Multiplanar CT image reconstructions and MIPs were obtained to evaluate the vascular anatomy. Carotid stenosis measurements (when applicable) are obtained utilizing NASCET criteria, using the distal internal carotid diameter as the denominator. CONTRAST:  75mL ISOVUE-370 IOPAMIDOL (ISOVUE-370) INJECTION 76% COMPARISON:  Head CT without contrast 0846 hours today. FINDINGS: CTA NECK Skeleton: Absent dentition. Lower cervical spine disc and endplate degeneration. No acute osseous abnormality identified. 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 The left upper extremity was injected such that there is prominent reflux of venous contrast into the neck and as far as the left  sigmoid sinus. Prominent paravertebral contrast reflux also. Mild upper lung atelectasis. Visible mediastinal lymph nodes are within normal limits. Other neck: Negative. No neck mass or lymphadenopathy. Aortic arch: Mild to moderate aortic arch atherosclerosis. Bovine type arch configuration (normal variant). Right carotid system: No brachiocephalic or right CCA origin stenosis despite some atherosclerosis. Similar soft plaque in the distal right CCA with no stenosis through the right carotid bifurcation. Calcified plaque in the posterior right ICA bulb without stenosis. Tortuous right ICA just below the skull base. Left carotid system: Calcified plaque at the bovine type left CCA origin with no significant stenosis. Prominent left IJ venous contrast reflux and streak artifact mildly degrades detail of the cervical left carotid. There is evidence of bulky soft plaque in the left ICA origin and bulb on series 10, image 134 with suspected high-grade left ICA origin stenosis approaching a radiographic string sign. The left ICA remains patent to the skull base with mild additional calcified plaque, no additional stenosis. Vertebral arteries: No proximal right subclavian artery or right vertebral artery origin stenosis. Right vertebral artery detail in the neck limited by paravertebral venous contrast reflux. No right vertebral artery stenosis to the skull base is evident. No proximal left subclavian artery or left vertebral artery origin stenosis despite some atherosclerosis. Limited left vertebral detail in the neck due to prominent venous contrast reflux. The left vertebral is patent to the skull base with no stenosis evident. CTA HEAD Suboptimal intracranial arterial contrast bolus, probably related to the innominate vein narrowing and venous reflux described above. Posterior circulation: Bilateral V4 segment calcified plaque without stenosis. Codominant distal vertebral arteries. Patent PICA origins and  vertebrobasilar junction. No basilar stenosis. Mild basilar ectasia. Normal SCA and PCA origins. Posterior communicating arteries are diminutive or absent. Bilateral PCA branches are within normal limits. Anterior circulation: Both ICA siphons are patent with symmetric enhancement. Both siphons are tortuous, ectatic especially in the cavernous segments. Bilateral siphon calcified plaque but no siphon stenosis. Normal ophthalmic artery origins. Patent carotid termini. Normal MCA and ACA origins. Anterior communicating artery and bilateral ACA branches are within normal limits. Right MCA M1 segment, bifurcation, and right MCA branches are within normal limits. Left MCA M1 segment, bifurcation, and left MCA branches are within normal limits. No left MCA branch occlusion is identified to correspond to the question Vessel hyperdensity by CT today. Venous sinuses: Patent. Prominent reflux of the injected contrast into the left IJ and even into the  left sigmoid sinus. Anatomic variants: Bovine aortic arch configuration. Delayed phase: No abnormal enhancement identified. Gray-white matter differentiation is stable since 0846 hours and within normal limits. Review of the MIP images confirms the above findings IMPRESSION: 1. Negative for large vessel occlusion. No left MCA branch occlusion or stenosis is evident. 2. Suspected bulky soft plaque at the Left ICA origin and bulb with High-Grade Left ICA origin stenosis approaching a RADIOGRAPHIC STRING SIGN. However, substantial venous contrast reflux in the left neck (related to anatomic left innominate vein stenosis) with streak artifact does limit detail of the left carotid. Therefore corroboration of the high-grade stenosis with Carotid Doppler US may be valuable. 3. No other hemodynamically significant stenosis in the head or neck. Generalized arterial tortuosity. 4. Stable CT appearance of the brain since 0846 hours today. Study discussed by telephone with Dr. Vanetta Mulders on 10/23/2018 at 10:16 . Electronically Signed   By: Odessa Fleming M.D.   On: 10/23/2018 10:18   Ct Angio Neck W And/or Wo Contrast  Result Date: 10/23/2018 CLINICAL DATA:  62 year old code stroke presentation. Right side weakness since 0400 hours. EXAM: CT ANGIOGRAPHY HEAD AND NECK TECHNIQUE: Multidetector CT imaging of the head and neck was performed using the standard protocol during bolus administration of intravenous contrast. Multiplanar CT image reconstructions and MIPs were obtained to evaluate the vascular anatomy. Carotid stenosis measurements (when applicable) are obtained utilizing NASCET criteria, using the distal internal carotid diameter as the denominator. CONTRAST:  75mL ISOVUE-370 IOPAMIDOL (ISOVUE-370) INJECTION 76% COMPARISON:  Head CT without contrast 0846 hours today. FINDINGS: CTA NECK Skeleton: Absent dentition. Lower cervical spine disc and endplate degeneration. No acute osseous abnormality identified. 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 The left upper extremity was injected such that there is prominent reflux of venous contrast into the neck and as far as the left sigmoid sinus. Prominent paravertebral contrast reflux also. Mild upper lung atelectasis. Visible mediastinal lymph nodes are within normal limits. Other neck: Negative. No neck mass or lymphadenopathy. Aortic arch: Mild to moderate aortic arch atherosclerosis. Bovine type arch configuration (normal variant). Right carotid system: No brachiocephalic or right CCA origin stenosis despite some atherosclerosis. Similar soft plaque in the distal right CCA with no stenosis through the right carotid bifurcation. Calcified plaque in the posterior right ICA bulb without stenosis. Tortuous right ICA just below the skull base. Left carotid system: Calcified plaque at the bovine type left CCA origin with no significant stenosis. Prominent left IJ venous contrast reflux and  streak artifact mildly degrades detail of the cervical left carotid. There is evidence of bulky soft plaque in the left ICA origin and bulb on series 10, image 134 with suspected high-grade left ICA origin stenosis approaching a radiographic string sign. The left ICA remains patent to the skull base with mild additional calcified plaque, no additional stenosis. Vertebral arteries: No proximal right subclavian artery or right vertebral artery origin stenosis. Right vertebral artery detail in the neck limited by paravertebral venous contrast reflux. No right vertebral artery stenosis to the skull base is evident. No proximal left subclavian artery or left vertebral artery origin stenosis despite some atherosclerosis. Limited left vertebral detail in the neck due to prominent venous contrast reflux. The left vertebral is patent to the skull base with no stenosis evident. CTA HEAD Suboptimal intracranial arterial contrast bolus, probably related to the innominate vein narrowing and venous reflux described above. Posterior circulation: Bilateral V4 segment calcified plaque without  stenosis.81 CUlyses [facility] distal vertebral arte52riUlyses [id number] A egative CT perfusion - no infarct or penumbra. Electronically Signed   By: Jonathon  Watts M.D.   On: 10/23/2018  11:51   Ct Head Code Stroke Wo Contrast  Result Date: 10/23/2018 CLINICAL DATA:  Code stroke. Right-sided weakness starting at 4 a.m. EXAM: CT HEAD WITHOUT CONTRAST TECHNIQUE: Contiguous axial images were obtained from the base of the skull through the vertex without intravenous contrast. COMPARISON:  None. FINDINGS: Brain: No evidence of acute infarction, hemorrhage, hydrocephalus, extra-axial collection or mass lesion/mass effect. Mild cerebral volume loss. Vascular: Hyperdense focus along the lower left sylvian fissure, not seen on reformats. Skull: No acute or aggressive finding. Sinuses/Orbits: Gaze is to the right. Other: These results were called by telephone at the time of interpretation on 10/23/2018 at 8:59 am to Dr. Keyonia Gluth , who verbally acknowledged these results. ASPECTS (Alberta Stroke Program Early CT Score) - Ganglionic level infarction (caudate, lentiform nuclei, internal capsule, insula, M1-M3 cortex): 7 - Supraganglionic infarction (M4-M6 cortex): 3 Total score (0-10 with 10 being normal): 10 IMPRESSION: 1. No hemorrhage or visible infarct.ASPECTS is 10. 2. Equivocal hyperdense focus in the lefBenny Lennertcannot exclude M2/3 branch thrombosis. Electronically Signed  By: Marnee Spring M.D.   On: 10/23/2018 09:01    Procedures Procedures (including critical care time)  CRITICAL CARE Performed by: Vanetta Mulders Total critical care time: 90 minutes Critical care time was exclusive of separately billable procedures and treating other patients. Critical care was necessary to treat or prevent imminent or life-threatening deterioration. Critical care was time spent personally by me on the following activities: development of treatment plan with patient and/or surrogate as well as nursing, discussions with consultants, evaluation of patient's response to treatment, examination of patient, obtaining history from patient or surrogate, ordering and performing treatments and  interventions, ordering and review of laboratory studies, ordering and review of radiographic studies, pulse oximetry and re-evaluation of patient's condition.   Medications Ordered in ED Medications  sodium chloride 0.9 % bolus 1,000 mL (1,000 mLs Intravenous New Bag/Given 10/23/18 1046)  iopamidol (ISOVUE-370) 76 % injection 100 mL (75 mLs Intravenous Contrast Given 10/23/18 0922)  iopamidol (ISOVUE-370) 76 % injection 100 mL (40 mLs Intravenous Contrast Given 10/23/18 1145)     Initial Impression / Assessment and Plan / ED Course  I have reviewed the triage vital signs and the nursing notes.  Pertinent labs & imaging results that were available during my care of the patient were reviewed by me and considered in my medical decision making (see chart for details).    Presentation consistent with CVA.  Code stroke called.  Patient with a CT head.  Raise some concerns about an abnormality in the M2 M3 vessels on the left side.  This led to CT angios which raise some concerns about a high-grade proximal left carotid stenosis.  This then led to perfusion scan which showed adequate brain perfusion.  The tele-neurologist as well as our neuro hospitalist Dr. Park Pope at [facility] were involved in the consultation.  Patient will not require acute intervention today.  But will require carotid artery work-up.  Patient will be admitted to [facility] by the hospitalist service here.  At one point during the work-up patient's blood pressure had improved significantly and due to the concerns for the high-grade carotid artery stenosis patient was placed with his head down.  Increased pressures and was given a liter of fluid.  Patient transported to [facility] by [facility].   Final Clinical Impressions(s) / ED Diagnoses   Final diagnoses:  Cerebrovascular accident (CVA), unspecified mechanism [facility])    ED Discharge Orders    None       Vanetta Mulders, MD 10/23/18 1605

## 2018-10-23 NOTE — ED Notes (Signed)
Pt transported to CT ?

## 2018-10-24 ENCOUNTER — Inpatient Hospital Stay (HOSPITAL_COMMUNITY): Payer: Medicaid Other

## 2018-10-24 ENCOUNTER — Encounter (HOSPITAL_COMMUNITY): Payer: Self-pay | Admitting: Physical Medicine and Rehabilitation

## 2018-10-24 DIAGNOSIS — E039 Hypothyroidism, unspecified: Secondary | ICD-10-CM

## 2018-10-24 DIAGNOSIS — I6789 Other cerebrovascular disease: Secondary | ICD-10-CM

## 2018-10-24 DIAGNOSIS — I639 Cerebral infarction, unspecified: Secondary | ICD-10-CM

## 2018-10-24 LAB — CBC
HCT: 41.8 % (ref 39.0–52.0)
Hemoglobin: 13.3 g/dL (ref 13.0–17.0)
MCH: 29.3 pg (ref 26.0–34.0)
MCHC: 31.8 g/dL (ref 30.0–36.0)
MCV: 92.1 fL (ref 80.0–100.0)
NRBC: 0 % (ref 0.0–0.2)
PLATELETS: 177 10*3/uL (ref 150–400)
RBC: 4.54 MIL/uL (ref 4.22–5.81)
RDW: 13.2 % (ref 11.5–15.5)
WBC: 7.4 10*3/uL (ref 4.0–10.5)

## 2018-10-24 LAB — HEMOGLOBIN A1C
Hgb A1c MFr Bld: 5.8 % — ABNORMAL HIGH (ref 4.8–5.6)
Mean Plasma Glucose: 119.76 mg/dL

## 2018-10-24 LAB — BASIC METABOLIC PANEL
Anion gap: 6 (ref 5–15)
BUN: 11 mg/dL (ref 8–23)
CHLORIDE: 109 mmol/L (ref 98–111)
CO2: 22 mmol/L (ref 22–32)
CREATININE: 1.13 mg/dL (ref 0.61–1.24)
Calcium: 8.5 mg/dL — ABNORMAL LOW (ref 8.9–10.3)
GFR calc Af Amer: >60 mL/min (ref >60)
GFR calc non Af Amer: >60 mL/min (ref >60)
Glucose, Bld: 109 mg/dL — ABNORMAL HIGH (ref 70–99)
Potassium: 3.7 mmol/L (ref 3.5–5.1)
Sodium: 137 mmol/L (ref 135–145)

## 2018-10-24 LAB — LIPID PANEL
CHOL/HDL RATIO: 3.7 ratio
CHOLESTEROL: 119 mg/dL (ref 0–200)
HDL: 32 mg/dL — ABNORMAL LOW (ref >40)
LDL Cholesterol: 63 mg/dL (ref 0–99)
TRIGLYCERIDES: 118 mg/dL (ref <150)
VLDL: 24 mg/dL (ref 0–40)

## 2018-10-24 LAB — ECHOCARDIOGRAM COMPLETE
HEIGHTINCHES: 68 in
Weight: 3392 oz

## 2018-10-24 MED ORDER — NICOTINE 21 MG/24HR TD PT24
21.0000 mg | MEDICATED_PATCH | Freq: Every day | TRANSDERMAL | Status: DC
  Filled 2018-10-24: qty 1

## 2018-10-24 MED ORDER — ATORVASTATIN CALCIUM 10 MG PO TABS: 20.0000 mg | ORAL_TABLET | Freq: Every day | ORAL | Status: DC

## 2018-10-24 MED ORDER — ATORVASTATIN CALCIUM 80 MG PO TABS
80.0000 mg | ORAL_TABLET | Freq: Every day | ORAL | Status: DC
  Administered 2018-10-24: 80 mg via ORAL
  Filled 2018-10-24: qty 1

## 2018-10-24 MED ORDER — KETOROLAC TROMETHAMINE 30 MG/ML IJ SOLN
30.0000 mg | Freq: Once | INTRAMUSCULAR | Status: AC
  Administered 2018-10-24: 30 mg via INTRAVENOUS
  Filled 2018-10-24: qty 1

## 2018-10-24 MED ORDER — LORAZEPAM 2 MG/ML IJ SOLN
0.5000 mg | Freq: Once | INTRAMUSCULAR | Status: AC
  Administered 2018-10-24: 0.5 mg via INTRAVENOUS
  Filled 2018-10-24: qty 1

## 2018-10-24 NOTE — Progress Notes (Signed)
SLP Cancellation Note  Patient Details Name: David Mosley MRN: [id number] DOB: September 25, 1956   Cancelled treatment:       Reason Eval/Treat Not Completed: Patient at procedure or test/unavailable; will continue efforts.   Blenda Mounts Laurice 10/24/2018, 9:43 AM

## 2018-10-24 NOTE — Progress Notes (Addendum)
Charted on the wrong pt:

## 2018-10-24 NOTE — Progress Notes (Signed)
VASCULAR LAB PRELIMINARY  PRELIMINARY  PRELIMINARY  PRELIMINARY  Carotid duplex completed.    Preliminary report:  1-39% right ICA stenosis.  40-59% left ICA stenosis.  Vertebral artery flow is antegrade.   Marquist Binstock, RVT 10/24/2018, 10:20 AM

## 2018-10-24 NOTE — Evaluation (Signed)
Occupational Therapy Evaluation Patient Details Name: David Mosley MRN: [id number] DOB: 29-Aug-1956 Today's Date: 10/24/2018    History of Present Illness Pt is a 62 y/o male admitted secondary to R sided weakness. CT negative for acute abnormality, MRI pending. 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 PMH includes PVD, HTN, and tobacco abuse.    Clinical Impression   This 62 yo male admitted with above presents to acute OT with PLOF of being totally independent with basic ADLs, IADLs, and working. Now pt is setup/S-max A for basic ADLs with decreased use of RUE/RLE and decreased sitting and standing balance. He will benefit from acute OT with follow up OT on CIR to work towards a Mod I level.     Follow Up Recommendations  SNF;Supervision/Assistance - 24 hour    Equipment Recommendations  Other (comment)(TBD next venue)       Precautions / Restrictions Precautions Precautions: Fall Precaution Comments: Permissive HTN 220/110 per neurology consult note Restrictions Weight Bearing Restrictions: No      Mobility Bed Mobility               General bed mobility comments: in the chair on arrival  Transfers Overall transfer level: Needs assistance Equipment used: Rolling walker (2 wheeled) Transfers: Sit to/from Stand Sit to Stand: Mod assist;+2 physical assistance         General transfer comment: cues and hand over hand assist for R hand placement on armrest.  Assist to come forward and up.    Balance Overall balance assessment: Needs assistance   Sitting balance-Leahy Scale: Poor     Standing balance support: Bilateral upper extremity supported Standing balance-Leahy Scale: Poor Standing balance comment: reliant on the RW and external support                           ADL either performed or assessed with clinical judgement   ADL Overall ADL's : Needs assistance/impaired Eating/Feeding: Supervision/ safety;Set up Eating/Feeding Details  (indicate cue type and reason): supported sitting in recliner, needs A for opening packages and lids that are a two handed task Grooming: Minimal assistance Grooming Details (indicate cue type and reason): supported sitting in recliner Upper Body Bathing: Minimal assistance Upper Body Bathing Details (indicate cue type and reason): supported sitting in recliner Lower Body Bathing: Moderate assistance Lower Body Bathing Details (indicate cue type and reason): Mod A +2 sit<>stand from recliner (VCs for hand placement) Upper Body Dressing : Moderate assistance Upper Body Dressing Details (indicate cue type and reason): supported sitting in recliner Lower Body Dressing: Maximal assistance Lower Body Dressing Details (indicate cue type and reason): Mod A +2 sit<>stand from recliner (VCs for hand placement) Toilet Transfer: Maximal assistance;+2 for physical assistance;Ambulation Toilet Transfer Details (indicate cue type and reason): Bil HHA, VCs for weight shifting and moving RLE with tall stance before moving LLE Toileting- Clothing Manipulation and Hygiene: Total assistance Toileting - Clothing Manipulation Details (indicate cue type and reason): Mod A +2 sit<>stand from recliner (VCs for hand placement)             Vision Baseline Vision/History: Wears glasses Wears Glasses: At all times Patient Visual Report: No change from baseline Vision Assessment?: Yes Eye Alignment: Within Functional Limits Ocular Range of Motion: Within Functional Limits Alignment/Gaze Preference: Within Defined Limits Tracking/Visual Pursuits: Able to track stimulus in all quads without difficulty Convergence: Within functional limits Visual Fields: No apparent deficits  Pertinent Vitals/Pain Pain Assessment: Faces Faces Pain Scale: Hurts little more Pain Location: R knee  Pain Descriptors / Indicators: Guarding;Grimacing Pain Intervention(s): Monitored during session;Repositioned     Hand  Dominance Right   Extremity/Trunk Assessment Upper Extremity Assessment Upper Extremity Assessment: RUE deficits/detail RUE Deficits / Details: While seated in recliner pt able with increased time and effort pt able to extend elbow and reach for my hand, pt also with increased effort and time able to raise arm over head with elbow bent, can extend and close hand and even "give the bird" (he wanted to show [place] he could do this). Synergistic patterns RUE Coordination: decreased fine motor;decreased gross motor           Communication Communication Communication: No difficulties   Cognition Arousal/Alertness: Awake/alert Behavior During Therapy: WFL for tasks assessed/performed Overall Cognitive Status: Within Functional Limits for tasks assessed                                                Home Living Family/patient expects to be discharged to:: Inpatient rehab Living Arrangements: Alone Available Help at Discharge: Friend(s);Available PRN/intermittently               Bathroom Shower/Tub: Tub/shower unit;Curtain   Bathroom Toilet: Standard            Lives With: Alone    Prior Functioning/Environment Level of Independence: Independent                 OT Problem List: Decreased strength;Decreased range of motion;Impaired balance (sitting and/or standing);Pain;Decreased coordination;Impaired UE functional use;Decreased knowledge of use of DME or AE;Impaired tone      OT Treatment/Interventions: Self-care/ADL training;Balance training;Therapeutic exercise;Therapeutic activities;DME and/or AE instruction;Patient/family education;Neuromuscular education    OT Goals(Current goals can be found in the care plan section) Acute Rehab OT Goals Patient Stated Goal: to regain independence and get back to work  OT Goal Formulation: With patient Time For Goal Achievement: 11/07/18 Potential to Achieve Goals: Good  OT Frequency: Min 3X/week   Barriers  to D/C: Decreased caregiver support          Co-evaluation PT/OT/SLP Co-Evaluation/Treatment: Yes Reason for Co-Treatment: For patient/therapist safety   OT goals addressed during session: ADL's and self-care;Strengthening/ROM      AM-PAC PT "6 Clicks" Daily Activity     Outcome Measure Help from another person eating meals?: A Little Help from another person taking care of personal grooming?: A Little Help from another person toileting, which includes using toliet, bedpan, or urinal?: A Lot Help from another person bathing (including washing, rinsing, drying)?: A Lot Help from another person to put on and taking off regular upper body clothing?: A Lot Help from another person to put on and taking off regular lower body clothing?: A Lot 6 Click Score: 14   End of Session Equipment Utilized During Treatment: Gait belt Nurse Communication: Mobility status  Activity Tolerance: Patient tolerated treatment well Patient left: in chair;with call bell/phone within reach;with chair alarm set  OT Visit Diagnosis: Unsteadiness on feet (R26.81);Other abnormalities of gait and mobility (R26.89);Muscle weakness (generalized) (M62.81);Hemiplegia and hemiparesis Hemiplegia - Right/Left: Right Hemiplegia - dominant/non-dominant: Dominant Hemiplegia - caused by: Cerebral infarction                Time: 1610-9604 OT Time Calculation (min): 31 min Charges:  OT General Charges $OT  Visit: 1 Visit OT Evaluation $OT Eval Moderate Complexity: 1 Mod  Ignacia Palma, OTR/L Acute [employer] Pager [phone] Office [phone]     Evette Georges 10/24/2018, 5:09 PM

## 2018-10-24 NOTE — Consult Note (Signed)
Physical Medicine and Rehabilitation Consult   Reason for Consult: Functional deficits due to stroke Referring Physician: Dr. Isidoro Donning   HPI: David Mosley is a 62 y.o. male with history of HTN, PSVT meningitis, tobacco use; who was admitted on 10/23/2018 with numbness and tingling of right upper extremity and right leg as well as weakness.  UDS negative CT perfusion head/neck done revealing no infarct or penumbra.  CTA head neck showed bulky soft plaque at left ICA origin and bulb with high-grade left ICA origin stenosis approaching radiographic string sign otherwise negative for large vessel occlusion.  TPA not given as out of window for treatment and he was transferred to [facility] for evaluation and treatment.  Work-up underway and CIR recommended due to functional deficits    Review of Systems  Constitutional: Negative for chills and fever.  HENT: Negative for hearing loss and tinnitus.   Eyes: Positive for blurred vision (poor vision. ).  Respiratory: Negative for cough and shortness of breath.   Cardiovascular: Negative for chest pain and palpitations.  Gastrointestinal: Negative for heartburn and nausea.  Musculoskeletal: Negative for back pain, falls, myalgias and neck pain.       RLE spasms  Skin: Negative for rash.  Neurological: Positive for tingling, sensory change and focal weakness.  Psychiatric/Behavioral: The patient is not nervous/anxious and does not have insomnia.      Past Medical History:  Diagnosis Date  . Hypertension   . Hypothyroidism   . Meningitis   . Pneumonia   . Tobacco use     Past Surgical History:  Procedure Laterality Date  . NO PAST SURGERIES      Family History  Problem Relation Age of Onset  . Alzheimer's disease Mother   . Prostate cancer Father   . Arrhythmia Sister     Social History:  Lives alone. 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 Does not drive due to visual deficits.  He reports that he has been smoking  cigarettes--has cut down from 3 PPD to about 3 packs/week. He has a 9.75 pack-year smoking history. He has never used smokeless tobacco. He reports that he does not drink alcohol or use drugs.    Allergies  Allergen Reactions  . Amlodipine Nausea Only, Anxiety and Palpitations  . Penicillins Itching    Has patient had a PCN reaction causing immediate rash, facial/tongue/throat swelling, SOB or lightheadedness with hypotension: No Has patient had a PCN reaction causing severe rash involving mucus membranes or skin necrosis: No Has patient had a PCN reaction that required hospitalization: No Has patient had a PCN reaction occurring within the last 10 years: No\ If all of the above answers are "NO", then may proceed with Cephalosporin use.    Medications Prior to Admission  Medication Sig Dispense Refill  . atorvastatin (LIPITOR) 20 MG tablet Take 1 tablet (20 mg total) by mouth daily. 90 tablet 1  . diltiazem (CARDIZEM CD) 180 MG 24 hr capsule Take 1 capsule (180 mg total) by mouth daily. 30 capsule 6  . flecainide (TAMBOCOR) 50 MG tablet Take 1 tablet (50 mg total) by mouth every 12 (twelve) hours. 60 tablet 6  . levothyroxine (SYNTHROID, LEVOTHROID) 100 MCG tablet Take 1 tablet (100 mcg total) by mouth daily. 90 tablet 0  . losartan (COZAAR) 50 MG tablet Take 1 tablet (50 mg total) by mouth daily. 90 tablet 1  . metoprolol tartrate (LOPRESSOR) 25 MG tablet Take 1 tablet (25 mg total) by  mouth 2 (two) times daily. 180 tablet 1    Home: Home Living Family/patient expects to be discharged to:: Inpatient rehab  Functional History: Prior Function Level of Independence: Independent Functional Status:  Mobility: Bed Mobility Overal bed mobility: Needs Assistance Bed Mobility: Supine to Sit, Sit to Supine Supine to sit: Mod assist Sit to supine: Min assist General bed mobility comments: Mod A for trunk elevation. Limited ability to assist with RUE. Increased time required. Min A for RLE  assist to return to supine.  Transfers Overall transfer level: Needs assistance Transfers: Lateral/Scoot Transfers  Lateral/Scoot Transfers: Min assist General transfer comment: Did not attempt to stand with +1 assist given RLE weakness, however, did perform lateral scoot along EOB. Required min A for assist with scooting. Heavy reliance on LUE/LLE to assist with scooting.       ADL:    Cognition: Cognition Overall Cognitive Status: Within Functional Limits for tasks assessed Orientation Level: Oriented X4 Cognition Arousal/Alertness: Awake/alert Behavior During Therapy: WFL for tasks assessed/performed Overall Cognitive Status: Within Functional Limits for tasks assessed   Blood pressure (!) 172/94, pulse 66, temperature 98.2 F (36.8 C), temperature source Oral, resp. rate 18, height 5\' 8"  (1.727 m), weight 96.2 kg, SpO2 100 %. Physical Exam  Nursing note and vitals reviewed. Constitutional: He is oriented to person, place, and time.  Neurological: He is alert and oriented to person, place, and time.  Mild right facial weakness. Speech clear and he is able to follow basic commands without difficulty. Right sided weakness with sensory deficits.     Results for orders placed or performed during the hospital encounter of 10/23/18 (from the past 24 hour(s))  Hemoglobin A1c     Status: Abnormal   Collection Time: 10/24/18  3:30 AM  Result Value Ref Range   Hgb A1c MFr Bld 5.8 (H) 4.8 - 5.6 %   Mean Plasma Glucose 119.76 mg/dL  Lipid panel     Status: Abnormal   Collection Time: 10/24/18  3:30 AM  Result Value Ref Range   Cholesterol 119 0 - 200 mg/dL   Triglycerides 409 <811 mg/dL   HDL 32 (L) >91 mg/dL   Total CHOL/HDL Ratio 3.7 RATIO   VLDL 24 0 - 40 mg/dL   LDL Cholesterol 63 0 - 99 mg/dL  CBC     Status: None   Collection Time: 10/24/18  3:30 AM  Result Value Ref Range   WBC 7.4 4.0 - 10.5 K/uL   RBC 4.54 4.22 - 5.81 MIL/uL   Hemoglobin 13.3 13.0 - 17.0 g/dL   HCT  47.8 29.5 - 62.1 %   MCV 92.1 80.0 - 100.0 fL   MCH 29.3 26.0 - 34.0 pg   MCHC 31.8 30.0 - 36.0 g/dL   RDW 30.8 65.7 - 84.6 %   Platelets 177 150 - 400 K/uL   nRBC 0.0 0.0 - 0.2 %  Basic metabolic panel     Status: Abnormal   Collection Time: 10/24/18  3:30 AM  Result Value Ref Range   Sodium 137 135 - 145 mmol/L   Potassium 3.7 3.5 - 5.1 mmol/L   Chloride 109 98 - 111 mmol/L   CO2 22 22 - 32 mmol/L   Glucose, Bld 109 (H) 70 - 99 mg/dL   BUN 11 8 - 23 mg/dL   Creatinine, Ser 9.62 0.61 - 1.24 mg/dL   Calcium 8.5 (L) 8.9 - 10.3 mg/dL   GFR calc non Af Amer >60 >60 mL/min   GFR  calc Af Amer >60 >60 mL/min   Anion gap 6 5 - 15   Ct Angio Head W/cm &/or Wo Cm  Result Date: 10/23/2018 CLINICAL DATA:  62 year old code stroke presentation. Right side weakness since 0400 hours. EXAM: CT ANGIOGRAPHY HEAD AND NECK TECHNIQUE: Multidetector CT imaging of the head and neck was performed using the standard protocol during bolus administration of intravenous contrast. Multiplanar CT image reconstructions and MIPs were obtained to evaluate the vascular anatomy. Carotid stenosis measurements (when applicable) are obtained utilizing NASCET criteria, using the distal internal carotid diameter as the denominator. CONTRAST:  75mL ISOVUE-370 IOPAMIDOL (ISOVUE-370) INJECTION 76% COMPARISON:  Head CT without contrast 0846 hours today. FINDINGS: CTA NECK Skeleton: Absent dentition. Lower cervical spine disc and endplate degeneration. No acute osseous abnormality identified. 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 The left upper extremity was injected such that there is prominent reflux of venous contrast into the neck and as far as the left sigmoid sinus. Prominent paravertebral contrast reflux also. Mild upper lung atelectasis. Visible mediastinal lymph nodes are within normal limits. Other neck: Negative. No neck mass or lymphadenopathy. Aortic arch: Mild to  moderate aortic arch atherosclerosis. Bovine type arch configuration (normal variant). Right carotid system: No brachiocephalic or right CCA origin stenosis despite some atherosclerosis. Similar soft plaque in the distal right CCA with no stenosis through the right carotid bifurcation. Calcified plaque in the posterior right ICA bulb without stenosis. Tortuous right ICA just below the skull base. Left carotid system: Calcified plaque at the bovine type left CCA origin with no significant stenosis. Prominent left IJ venous contrast reflux and streak artifact mildly degrades detail of the cervical left carotid. There is evidence of bulky soft plaque in the left ICA origin and bulb on series 10, image 134 with suspected high-grade left ICA origin stenosis approaching a radiographic string sign. The left ICA remains patent to the skull base with mild additional calcified plaque, no additional stenosis. Vertebral arteries: No proximal right subclavian artery or right vertebral artery origin stenosis. Right vertebral artery detail in the neck limited by paravertebral venous contrast reflux. No right vertebral artery stenosis to the skull base is evident. No proximal left subclavian artery or left vertebral artery origin stenosis despite some atherosclerosis. Limited left vertebral detail in the neck due to prominent venous contrast reflux. The left vertebral is patent to the skull base with no stenosis evident. CTA HEAD Suboptimal intracranial arterial contrast bolus, probably related to the innominate vein narrowing and venous reflux described above. Posterior circulation: Bilateral V4 segment calcified plaque without stenosis. Codominant distal vertebral arteries. Patent PICA origins and vertebrobasilar junction. No basilar stenosis. Mild basilar ectasia. Normal SCA and PCA origins. Posterior communicating arteries are diminutive or absent. Bilateral PCA branches are within normal limits. Anterior circulation: Both ICA  siphons are patent with symmetric enhancement. Both siphons are tortuous, ectatic especially in the cavernous segments. Bilateral siphon calcified plaque but no siphon stenosis. Normal ophthalmic artery origins. Patent carotid termini. Normal MCA and ACA origins. Anterior communicating artery and bilateral ACA branches are within normal limits. Right MCA M1 segment, bifurcation, and right MCA branches are within normal limits. Left MCA M1 segment, bifurcation, and left MCA branches are within normal limits. No left MCA branch occlusion is identified to correspond to the question Vessel hyperdensity by CT today. Venous sinuses: Patent. Prominent reflux of the injected contrast into the left IJ and even into the left sigmoid sinus. Anatomic variants:  Bovine aortic arch configuration. Delayed phase: No abnormal enhancement identified. Gray-white matter differentiation is stable since 0846 hours and within normal limits. Review of the MIP images confirms the above findings IMPRESSION: 1. Negative for large vessel occlusion. No left MCA branch occlusion or stenosis is evident. 2. Suspected bulky soft plaque at the Left ICA origin and bulb with High-Grade Left ICA origin stenosis approaching a RADIOGRAPHIC STRING SIGN. However, substantial venous contrast reflux in the left neck (related to anatomic left innominate vein stenosis) with streak artifact does limit detail of the left carotid. Therefore corroboration of the high-grade stenosis with Carotid Doppler US may be valuable. 3. No other hemodynamically significant stenosis in the head or neck. Generalized arterial tortuosity. 4. Stable CT appearance of the brain since 0846 hours today. Study discussed by telephone with Dr. Vanetta Mulders on 10/23/2018 at 10:16 . Electronically Signed   By: Odessa Fleming M.D.   On: 10/23/2018 10:18   Ct Angio Neck W And/or Wo Contrast  Result Date: 10/23/2018 CLINICAL DATA:  62 year old code stroke presentation. Right side weakness since  0400 hours. EXAM: CT ANGIOGRAPHY HEAD AND NECK TECHNIQUE: Multidetector CT imaging of the head and neck was performed using the standard protocol during bolus administration of intravenous contrast. Multiplanar CT image reconstructions and MIPs were obtained to evaluate the vascular anatomy. Carotid stenosis measurements (when applicable) are obtained utilizing NASCET criteria, using the distal internal carotid diameter as the denominator. CONTRAST:  75mL ISOVUE-370 IOPAMIDOL (ISOVUE-370) INJECTION 76% COMPARISON:  Head CT without contrast 0846 hours today. FINDINGS: CTA NECK Skeleton: Absent dentition. Lower cervical spine disc and endplate degeneration. No acute osseous abnormality identified. 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 The left upper extremity was injected such that there is prominent reflux of venous contrast into the neck and as far as the left sigmoid sinus. Prominent paravertebral contrast reflux also. Mild upper lung atelectasis. Visible mediastinal lymph nodes are within normal limits. Other neck: Negative. No neck mass or lymphadenopathy. Aortic arch: Mild to moderate aortic arch atherosclerosis. Bovine type arch configuration (normal variant). Right carotid system: No brachiocephalic or right CCA origin stenosis despite some atherosclerosis. Similar soft plaque in the distal right CCA with no stenosis through the right carotid bifurcation. Calcified plaque in the posterior right ICA bulb without stenosis. Tortuous right ICA just below the skull base. Left carotid system: Calcified plaque at the bovine type left CCA origin with no significant stenosis. Prominent left IJ venous contrast reflux and streak artifact mildly degrades detail of the cervical left carotid. There is evidence of bulky soft plaque in the left ICA origin and bulb on series 10, image 134 with suspected high-grade left ICA origin stenosis approaching a radiographic  string sign. The left ICA remains patent to the skull base with mild additional calcified plaque, no additional stenosis. Vertebral arteries: No proximal right subclavian artery or right vertebral artery origin stenosis. Right vertebral artery detail in the neck limited by paravertebral venous contrast reflux. No right vertebral artery stenosis to the skull base is evident. No proximal left subclavian artery or left vertebral artery origin stenosis despite some atherosclerosis. Limited left vertebral detail in the neck due to prominent venous contrast reflux. The left vertebral is patent to the skull base with no stenosis evident. CTA HEAD Suboptimal intracranial arterial contrast bolus, probably related to the innominate vein narrowing and venous reflux described above. Posterior circulation: Bilateral V4 segment calcified plaque without stenosis. Codominant distal vertebral arteries.  Patent PICA origins and vertebrobasilar junction. No basilar stenosis. Mild basilar ectasia. Normal SCA and PCA origins. Posterior communicating arteries are diminutive or absent. Bilateral PCA branches are within normal limits. Anterior circulation: Both ICA siphons are patent with symmetric enhancement. Both siphons are tortuous, ectatic especially in the cavernous segments. Bilateral siphon calcified plaque but no siphon stenosis. Normal ophthalmic artery origins. Patent carotid termini. Normal MCA and ACA origins. Anterior communicating artery and bilateral ACA branches are within normal limits. Right MCA M1 segment, bifurcation, and right MCA branches are within normal limits. Left MCA M1 segment, bifurcation, and left MCA branches are within normal limits. No left MCA branch occlusion is identified to correspond to the question Vessel hyperdensity by CT today. Venous sinuses: Patent. Prominent reflux of the injected contrast into the left IJ and even into the left sigmoid sinus. Anatomic variants: Bovine aortic arch  configuration. Delayed phase: No abnormal enhancement identified. Gray-white matter differentiation is stable since 0846 hours and within normal limits. Review of the MIP images confirms the above findings IMPRESSION: 1. Negative for large vessel occlusion. No left MCA branch occlusion or stenosis is evident. 2. Suspected bulky soft plaque at the Left ICA origin and bulb with High-Grade Left ICA origin stenosis approaching a RADIOGRAPHIC STRING SIGN. However, substantial venous contrast reflux in the left neck (related to anatomic left innominate vein stenosis) with streak artifact does limit detail of the left carotid. Therefore corroboration of the high-grade stenosis with Carotid Doppler US may be valuable. 3. No other hemodynamically significant stenosis in the head or neck. Generalized arterial tortuosity. 4. Stable CT appearance of the brain since 0846 hours today. Study discussed by telephone with Dr. Vanetta Mulders on 10/23/2018 at 10:16 . Electronically Signed   By: Odessa Fleming M.D.   On: 10/23/2018 10:18   Ct Cerebral Perfusion W Contrast  Result Date: 10/23/2018 CLINICAL DATA:  Arterial stricture. EXAM: CT PERFUSION BRAIN TECHNIQUE: Multiphase CT imaging of the brain was performed following IV bolus contrast injection. Subsequent parametric perfusion maps were calculated using RAPID software. CONTRAST:  40mL ISOVUE-370 IOPAMIDOL (ISOVUE-370) INJECTION 76% COMPARISON:  CTA head neck from earlier today FINDINGS: CT Brain Perfusion Findings: CBF (<30%) Volume: 0mL Perfusion (Tmax>6.0s) volume: 0mL ASPECTS on noncontrast CT Head: 10 at 8:46 a.m. today. IMPRESSION: Negative CT perfusion - no infarct or penumbra. Electronically Signed   By: Marnee Spring M.D.   On: 10/23/2018 11:51   Ct Head Code Stroke Wo Contrast  Result Date: 10/23/2018 CLINICAL DATA:  Code stroke. Right-sided weakness starting at 4 a.m. EXAM: CT HEAD WITHOUT CONTRAST TECHNIQUE: Contiguous axial images were obtained from the base of  the skull through the vertex without intravenous contrast. COMPARISON:  None. FINDINGS: Brain: No evidence of acute infarction, hemorrhage, hydrocephalus, extra-axial collection or mass lesion/mass effect. Mild cerebral volume loss. Vascular: Hyperdense focus along the lower left sylvian fissure, not seen on reformats. Skull: No acute or aggressive finding. Sinuses/Orbits: Gaze is to the right. Other: These results were called by telephone at the time of interpretation on 10/23/2018 at 8:59 am to Dr. Vanetta Mulders , who verbally acknowledged these results. ASPECTS [facility] Stroke Program Early CT Score) - Ganglionic level infarction (caudate, lentiform nuclei, internal capsule, insula, M1-M3 cortex): 7 - Supraganglionic infarction (M4-M6 cortex): 3 Total score (0-10 with 10 being normal): 10 IMPRESSION: 1. No hemorrhage or visible infarct.ASPECTS is 10. 2. Equivocal hyperdense focus in the left sylvian fissure, cannot exclude M2/3 branch thrombosis. Electronically Signed   By: Marnee Spring  M.D.   On: 10/23/2018 09:01     Assessment/Plan: Diagnosis: Hospital day # 2 after presenting with mild right hemiparesis and sensory loss. Presumptive CVA 1. Does the need for close, 24 hr/day medical supervision in concert with the patient's rehab needs make it unreasonable for this patient to be served in a less intensive setting? Potentially  2. Co-Morbidities requiring supervision/potential complications: HTN, PSVT, meningitis,  3. Due to bladder management, bowel management, safety, skin/wound care, disease management, medication administration, pain management and patient education, does the patient require 24 hr/day rehab nursing? Yes/potentially 4. Does the patient require coordinated care of a physician, rehab nurse, PT (1-2 hrs/day, 5 days/week) and OT (1-2 hrs/day, 5 days/week) to address physical and functional deficits in the context of the above medical diagnosis(es)? Potentially Addressing deficits in  the following areas: balance, endurance, locomotion, strength, transferring, bowel/bladder control, bathing, dressing, feeding, grooming and psychosocial support 5. Can the patient actively participate in an intensive therapy program of at least 3 hrs of therapy per day at least 5 days per week? Yes 6. The potential for patient to make measurable gains while on inpatient rehab is TBD 7. Anticipated functional outcomes upon discharge from inpatient rehab are modified independent  with PT, modified independent with OT, n/a with SLP. 8. Estimated rehab length of stay to reach the above functional goals is: ?TBD 9. Anticipated D/C setting: Other 10. Anticipated post D/C treatments: HH therapy 11. Overall Rehab/Functional Prognosis: excellent  RECOMMENDATIONS: This patient's condition is appropriate for continued rehabilitative care in the following setting: see below Patient has agreed to participate in recommended program. N/A Note that insurance prior authorization may be required for reimbursement for recommended care.  Comment: Hospital day #2. Presumptive CVA. Observe for functional progress with therapies.  Short CIR stay vs home with home health   Ranelle Oyster, MD, [facility] Physical Medicine & Rehabilitation 10/24/2018   Jacquelynn Cree, PA-C 10/24/2018

## 2018-10-24 NOTE — Progress Notes (Signed)
STROKE TEAM PROGRESS NOTE   SUBJECTIVE (INTERVAL HISTORY) His wife is at the bedside.  Patient sitting in chair, still has right-sided weakness.  Carotid Doppler done showed left ICA 40 to 59% stenosis, indicating CTA left ICA string sign likely to be artifact.  MRI still pending.   OBJECTIVE Vitals:   10/24/18 0323 10/24/18 0755 10/24/18 1028 10/24/18 1222  BP: (!) 164/86 (!) 172/94 (!) 166/88 (!) 161/75  Pulse: 65 61 66 65  Resp:  18 18 18   Temp: 98.7 F (37.1 C) 97.8 F (36.6 C) 98.2 F (36.8 C) 98.2 F (36.8 C)  TempSrc: Oral Oral Oral Oral  SpO2: 95% 99% 100% 95%  Weight:      Height:        CBC:  Recent Labs  Lab 10/23/18 0840 10/23/18 0845 10/24/18 0330  WBC 9.0  --  7.4  NEUTROABS 5.5  --   --   HGB 13.7 13.9 13.3  HCT 42.0 41.0 41.8  MCV 91.9  --  92.1  PLT 179  --  177    Basic Metabolic Panel:  Recent Labs  Lab 10/23/18 0840 10/23/18 0845 10/24/18 0330  NA 136 137 137  K 3.6 3.7 3.7  CL 104 101 109  CO2 25  --  22  GLUCOSE 122* 119* 109*  BUN 12 12 11   CREATININE 1.26* 1.30* 1.13  CALCIUM 8.8*  --  8.5*    Lipid Panel:     Component Value Date/Time   CHOL 119 10/24/2018 0330   TRIG 118 10/24/2018 0330   HDL 32 (L) 10/24/2018 0330   CHOLHDL 3.7 10/24/2018 0330   VLDL 24 10/24/2018 0330   LDLCALC 63 10/24/2018 0330   HgbA1c:  Lab Results  Component Value Date   HGBA1C 5.8 (H) 10/24/2018   Urine Drug Screen:     Component Value Date/Time   LABOPIA NONE DETECTED 10/23/2018 0841   COCAINSCRNUR NONE DETECTED 10/23/2018 0841   LABBENZ NONE DETECTED 10/23/2018 0841   AMPHETMU NONE DETECTED 10/23/2018 0841   THCU NONE DETECTED 10/23/2018 0841   LABBARB NONE DETECTED 10/23/2018 0841    Alcohol Level     Component Value Date/Time   ETH <10 10/23/2018 0840    IMAGING  Ct Angio Head W/cm &/or Wo Cm Ct Angio Neck W And/or Wo Contrast 10/23/2018 IMPRESSION:  1. Negative for large vessel occlusion. No left MCA branch occlusion or  stenosis is evident.  2. Suspected bulky soft plaque at the Left ICA origin and bulb with High-Grade Left ICA origin stenosis approaching a RADIOGRAPHIC STRING SIGN. However, substantial venous contrast reflux in the left neck (related to anatomic left innominate vein stenosis) with streak artifact does limit detail of the left carotid. Therefore corroboration of the high-grade stenosis with Carotid Doppler US may be valuable.  3. No other hemodynamically significant stenosis in the head or neck. Generalized arterial tortuosity.  4. Stable CT appearance of the brain since 0846 hours today.   Ct Cerebral Perfusion W Contrast 10/23/2018 IMPRESSION:  Negative CT perfusion - no infarct or penumbra.   Ct Head Code Stroke Wo Contrast 10/23/2018 IMPRESSION:  1. No hemorrhage or visible infarct.ASPECTS is 10.  2. Equivocal hyperdense focus in the left sylvian fissure, cannot exclude M2/3 branch thrombosis.    MRI [name] Contrast - pending   Transthoracic Echocardiogram  00/00/00 Study Conclusions - Left ventricle: The cavity size was normal. Systolic function was   normal. The estimated ejection fraction was in the range of  60%   to 65%. Wall motion was normal; there were no regional wall   motion abnormalities. Left ventricular diastolic function   parameters were normal. - Aortic valve: There was no significant regurgitation. - Mitral valve: There was trivial regurgitation. - Right ventricle: Systolic function was low normal. - Atrial septum: No defect or patent foramen ovale was identified. - Tricuspid valve: There was trivial regurgitation. Impressions: - No cardiac source of emboli was indentified.   Bilateral Carotid Dopplers 1-39% right ICA stenosis.  40-59% left ICA stenosis.  Vertebral artery flow is antegrade.     PHYSICAL EXAM  Temp:  [97.6 F (36.4 C)-98.7 F (37.1 C)] 98 F (36.7 C) (11/07 1551) Pulse Rate:  [61-66] 62 (11/07 1551) Resp:  [16-20] 18 (11/07  1551) BP: (151-172)/(75-94) 162/85 (11/07 1551) SpO2:  [93 %-100 %] 95 % (11/07 1551)  General - Well nourished, well developed, in no apparent distress.  Ophthalmologic - fundi not visualized due to noncooperation.  Cardiovascular - Regular rate and rhythm.  Mental Status -  Level of arousal and orientation to time, place, and person were intact. Language including expression, naming, repetition, comprehension was assessed and found intact. Attention span and concentration were normal. Fund of Knowledge was assessed and was intact.  Cranial Nerves II - XII - II - Visual field intact OU. III, IV, VI - Extraocular movements intact. V - Facial sensation intact bilaterally. VII - mild right nasolabial fold flattening. VIII - Hearing & vestibular intact bilaterally. X - Palate elevates symmetrically. XI - Chin turning & shoulder shrug intact bilaterally. XII - Tongue protrusion intact.  Motor Strength - The patient's strength was normal in LUE and LLE, however right upper extremity proximal 3+/5 and distal 2/5, right lower extremity proximal 3+/5 and distal 3-/5 and pronator drift was present on the right.  Bulk was normal and fasciculations were absent.   Motor Tone - Muscle tone was assessed at the neck and appendages and was normal.  Reflexes - The patient's reflexes were symmetrical in all extremities and he had no pathological reflexes.  Sensory - Light touch, temperature/pinprick were assessed and were symmetrical.    Coordination - The patient had normal movements in the hands with no ataxia or dysmetria.  Tremor was absent.  Gait and Station - deferred.   ASSESSMENT/PLAN [name] is a 62 y.o. male with history of tobacco abuse, hypothyroidism ,PVD, PSVT, and hypertension presenting with right sided weakness and numbness. He did not receive IV t-PA due to late presentation.  Stroke: Likely left subcortical infarct likely secondary to small versus large vessel  disease (left ICA soft plaque)  Resultant right hemipresis  CT head - cannot exclude M2/3 branch thrombosis.   MRI head - pending  CTA H&N - Suspected bulky soft plaque at the Left ICA origin and bulb with High-Grade Left ICA origin stenosis  Carotid Doppler left ICA 40 to 59% stenosis  2D Echo  - EF 60 - 65%. No cardiac source of emboli identified.   LDL - 63  HgbA1c - 5.8  UDS negative  VTE prophylaxis - [place] Heparin  Diet  - Heart healthy with thin liquids.  No antithrombotic prior to admission, now on aspirin 325 mg daily.   Patient counseled to be compliant with his antithrombotic medications  Ongoing aggressive stroke risk factor management  Therapy recommendations:  pending  Disposition:  Pending  Left ICA soft plaque  CTA showed left ICA bulky soft plaque  Carotid Doppler showed left ICA  40 to 59% stenosis  MRI pending  Left brain stroke could be due to left ICA nonstenotic soft plaque  Outpatient follow-up with vascular surgery  Hypertension  Stable . Permissive hypertension (OK if < 220/120) but gradually normalize in 5-7 days . Long-term BP goal normotensive  Hyperlipidemia  Lipid lowering medication PTA:  Lipitor 20 mg daily  LDL 63, goal < 70  Current lipid lowering medication: Lipitor 20 mg daily  Continue statin at discharge  Wide QRS V. Tach  Following with cardiology Dr. Ladona Ridgel  On flecainide and Cardizem  Not much heart palpitation since taking the medications  Tobacco abuse  Current smoker  3 pack/day in the past and for the last 2 months 3 pack/week  Smoking cessation counseling provided  Pt is willing to quit  Other Stroke Risk Factors  Advanced age  Obesity, Body mass index is 32.23 kg/m., recommend weight loss, diet and exercise as appropriate   Other Active Problems  Elevated creatinine - improved  Hospital day # 1  Marvel Plan, MD PhD Stroke Neurology 10/24/2018 6:41 PM    To contact Stroke  Continuity provider, please refer to [email or web address]. After hours, contact General Neurology

## 2018-10-24 NOTE — Progress Notes (Signed)
Physical Therapy Treatment Patient Details Name: David Mosley MRN: [id number] DOB: Jul 11, 1956 Today's Date: 10/24/2018    History of Present Illness Pt is a 62 y/o male admitted secondary to R sided weakness. CT negative for acute abnormality, MRI pending. 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 PMH includes PVD, HTN, and tobacco abuse.     PT Comments    Pt was restless and agitated that more hadn't been done for him.  Pt eager to participate.  Emphasis on standing/pre gait activity and then gait training in the RW   Follow Up Recommendations  CIR;Supervision for mobility/OOB     Equipment Recommendations  None recommended by PT    Recommendations for Other Services Rehab consult     Precautions / Restrictions Precautions Precautions: Fall;Other (comment) Precaution Comments: Permissive HTN 220/110    Mobility  Bed Mobility               General bed mobility comments: in the chair on arrival  Transfers Overall transfer level: Needs assistance Equipment used: Rolling walker (2 wheeled) Transfers: Sit to/from Stand Sit to Stand: Mod assist;+2 physical assistance         General transfer comment: cues and hand over hand assist for R hand placement on armrest.  Assist to come forward and up.  Ambulation/Gait Ambulation/Gait assistance: Max assist;+2 physical assistance Gait Distance (Feet): 18 Feet Assistive device: Rolling walker (2 wheeled) Gait Pattern/deviations: Step-to pattern;Decreased step length - right;Decreased step length - left;Decreased stance time - right;Decreased weight shift to left;Scissoring;Leaning posteriorly   Gait velocity interpretation: <1.31 ft/sec, indicative of household ambulator General Gait Details: Hemiparetic gait on the right with significant help to assist with w/shift L and support for advancing right LE then follow through with L LE.  pt having trouble with translation forward.   Stairs             Wheelchair  Mobility    Modified Rankin (Stroke Patients Only) Modified Rankin (Stroke Patients Only) Pre-Morbid Rankin Score: No symptoms Modified Rankin: Moderately severe disability     Balance Overall balance assessment: Needs assistance   Sitting balance-Leahy Scale: Poor     Standing balance support: Bilateral upper extremity supported Standing balance-Leahy Scale: Poor Standing balance comment: reliant on the RW and external support                            Cognition Arousal/Alertness: Awake/alert Behavior During Therapy: WFL for tasks assessed/performed Overall Cognitive Status: Within Functional Limits for tasks assessed                                        Exercises      General Comments        Pertinent Vitals/Pain Pain Assessment: Faces Faces Pain Scale: Hurts little more Pain Location: R knee  Pain Descriptors / Indicators: Guarding;Grimacing Pain Intervention(s): Monitored during session    Home Living     Available Help at Discharge: Friend(s)                Prior Function            PT Goals (current goals can now be found in the care plan section) Acute Rehab PT Goals PT Goal Formulation: With patient Time For Goal Achievement: 11/06/18 Potential to Achieve Goals: Good Progress towards PT goals: Progressing toward  goals    Frequency    Min 4X/week      PT Plan Current plan remains appropriate    Co-evaluation              AM-PAC PT "6 Clicks" Daily Activity  Outcome Measure  Difficulty turning over in bed (including adjusting bedclothes, sheets and blankets)?: A Lot Difficulty moving from lying on back to sitting on the side of the bed? : Unable Difficulty sitting down on and standing up from a chair with arms (e.g., wheelchair, bedside commode, etc,.)?: Unable Help needed moving to and from a bed to chair (including a wheelchair)?: A Lot Help needed walking in hospital room?: A Lot Help  needed climbing 3-5 steps with a railing? : A Lot 6 Click Score: 10    End of Session   Activity Tolerance: Patient tolerated treatment well Patient left: in chair;with call bell/phone within reach;with chair alarm set Nurse Communication: Mobility status PT Visit Diagnosis: Unsteadiness on feet (R26.81);Muscle weakness (generalized) (M62.81)     Time: 1356-1430 PT Time Calculation (min) (ACUTE ONLY): 34 min  Charges:  $Gait Training: 8-22 mins                     10/24/2018  Kendall Bing, PT Acute Rehabilitation Services [phone]  (pager) [phone]  (office)   Eliseo Gum Johnelle Tafolla 10/24/2018, 4:44 PM

## 2018-10-24 NOTE — Progress Notes (Signed)
Rehab Admissions Coordinator Note:  Patient was screened by Clois Dupes for appropriateness for an Inpatient Acute Rehab Consult per PT recommendation. .  At this time, we are recommending Inpatient Rehab consult. I will contact Dr. Isidoro Donning for order.  Ottie Glazier, RN, MSN Rehab Admissions Coordinator [phone] 10/24/2018 10:07 AM

## 2018-10-24 NOTE — Progress Notes (Signed)
  Echocardiogram 2D Echocardiogram has been performed.  David Mosley L Androw 10/24/2018, 9:40 AM

## 2018-10-24 NOTE — Evaluation (Signed)
Speech Language Pathology Evaluation Patient Details Name: David Mosley MRN: [id number] DOB: Jun 09, 1956 Today's Date: 10/24/2018 Time: 1450-1505 SLP Time Calculation (min) (ACUTE ONLY): 15 min  Problem List:  Patient Active Problem List   Diagnosis Date Noted  . CVA (cerebral vascular accident) (HCC) 10/23/2018  . Wide-complex tachycardia (HCC) 03/28/2018  . Hypothyroidism 03/28/2018  . Elevated AST (SGOT) 03/28/2018  . Chest pain 03/28/2018  . Tobacco use 03/28/2018  . Sleep apnea 03/28/2018  . Paroxysmal ventricular tachycardia (HCC)   . PSVT (paroxysmal supraventricular tachycardia) (HCC) 03/26/2018  . Essential hypertension, benign 05/29/2016  . Thyroid activity decreased 03/21/2016  . Edema 03/21/2016  . PVD (peripheral vascular disease) (HCC) 03/21/2016  . Cigarette nicotine dependence with nicotine-induced disorder 03/21/2016   Past Medical History:  Past Medical History:  Diagnosis Date  . Hypertension   . Hypothyroidism   . Meningitis   . Pneumonia   . PSVT (paroxysmal supraventricular tachycardia) (HCC)   . PVD (peripheral vascular disease) (HCC)   . Tobacco use    Past Surgical History:  Past Surgical History:  Procedure Laterality Date  . NO PAST SURGERIES     HPI:  62 y.o. male with history of HTN, PSVT meningitis, tobacco use who was admitted on 10/23/2018 with numbness and tingling of right upper extremity and right leg as well as weakness. CT neg; high grade stenosis on L ICA; MRI pending.   Assessment / Plan / Recommendation Clinical Impression  Pt presents with normal expressive and receptive language; speech is clear, without dysarthria, and fluent.  He is oriented x 4 with good awareness of situation.  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  Expression of thoughs is cogent and appropriate.  No SLP f/u is needed - our service will sign off.     SLP Assessment  SLP Recommendation/Assessment: Patient does not need any further Speech  [facility] SLP Visit Diagnosis: Cognitive communication deficit (R41.841)    Follow Up Recommendations  None    Frequency and Duration           SLP Evaluation Cognition  Overall Cognitive Status: Within Functional Limits for tasks assessed Arousal/Alertness: Awake/alert Orientation Level: Oriented X4 Attention: Selective Selective Attention: Appears intact Memory: Appears intact Awareness: Appears intact       Comprehension  Auditory Comprehension Overall Auditory Comprehension: Appears within functional limits for tasks assessed Visual Recognition/Discrimination Discrimination: Within Function Limits Reading Comprehension Reading Status: (pt illiterate)    Expression Expression Primary Mode of Expression: Verbal Verbal Expression Overall Verbal Expression: Appears within functional limits for tasks assessed Written Expression Dominant Hand: Right Written Expression: (pt illiterate)   Oral / Motor  Oral Motor/Sensory Function Overall Oral Motor/Sensory Function: Mild impairment Facial Symmetry: Abnormal symmetry right Motor Speech Overall Motor Speech: Appears within functional limits for tasks assessed   GO                    Blenda Mounts Laur[place] 10/24/2018, 3:15 PM

## 2018-10-24 NOTE — Progress Notes (Signed)
[facility]                                                                              Patient Demographics  David Mosley, is a 62 y.o. male, DOB - [birth year], JXB:[id number]  Admit date - 10/23/2018   Admitting Physician David Hoover Brunette, DO  Outpatient Primary MD for the patient is David Hawking, PA-C  Outpatient specialists:   LOS - 1  days   Medical records reviewed and are as summarized below:    Chief Complaint  Patient presents with  . Weakness       Brief summary   Patient is a 62 y.o. male with history of prior meningitis, hypertension, hyperlipidemia, hypothyroidism, PVD, tobacco use, presented with right-sided weakness of onset that occurred approximately between 3 and 4 AM.  Patient reported that he usually wakes up at around 3 in the morning and was doing well at that time and came downstairs to pet his cat and fell asleep shortly after only to wake up around 4 to 4:30 AM with significant weakness to his right lower extremity that is greater than his upper extremity.  He also had some mild associated numbness and tingling with no known headache, visual deficit, or speech deficits.  He states that he takes his medications as otherwise prescribed and does continue to smoke on a daily basis. 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 noted on the CTA head and neck however no abnormalities on the perfusion study -Neurology consulted, follow MRI brain, 2D echo, carotid Dopplers -LDL 63, hemoglobin A1c 5.8 -Neurology recommended aspirin 325 mg daily with the Lipitor 80 mg daily, permissive hypertension -PT recommended CIR, consult placed  Active  Problems:   PVD (peripheral vascular disease) (HCC) -Continue full dose aspirin    Cigarette nicotine dependence with nicotine-induced disorder -Patient counseled on nicotine cessation -Place nicotine patch    Essential hypertension, benign -BP currently elevated however permissive hypertension due to suspected acute CVA    Hypothyroidism Continue levothyroxine  History of PSVT Continue flecainide, Cardizem Continue telemetry, rule out A. Fib  CKD stage II -Creatinine currently at baseline   Code Status: Full CODE STATUS DVT Prophylaxis: Heparin Family Communication: Discussed in detail with the patient, all imaging results, lab results explained to the patient    Disposition Plan: Awaiting full stroke work-up  Time Spent in minutes   25 minutes  Procedures:  CTA head and neck CT perfusion study  Consultants:   Neurology CIR  Antimicrobials:      Medications  Scheduled Meds: . aspirin  300 mg Rectal Daily   Or  . aspirin  325 mg Oral Daily  . atorvastatin  20 mg Oral Daily  . diltiazem  180 mg Oral Daily  . flecainide  50 mg Oral Q12H  . heparin  5,000 Units Subcutaneous Q8H  . levothyroxine  100 mcg Oral  QAC breakfast  . metoprolol tartrate  12.5 mg Oral BID   Continuous Infusions: PRN Meds:.acetaminophen **OR** acetaminophen (TYLENOL) oral liquid 160 mg/5 mL **OR** acetaminophen, hydrALAZINE   Antibiotics   Anti-infectives (From admission, onward)   None        Subjective:   David Mosley was seen and examined today.  Right-sided weakness still persisting, feeling bit disappointed.  Patient denies dizziness, chest pain, shortness of breath, abdominal pain, N/V/D/C.Marland Kitchen No acute events overnight.    Objective:   Vitals:   10/23/18 2339 10/24/18 0130 10/24/18 0323 10/24/18 0755  BP: (!) 155/85 (!) 164/85 (!) 164/86 (!) 172/94  Pulse: 64 63 65 61  Resp: 18 18  18   Temp: 97.8 F (36.6 C) 98.1 F (36.7 C) 98.7 F (37.1 C) 97.8 F (36.6 C)   TempSrc: Oral Oral Oral Oral  SpO2: 95% 93% 95% 99%  Weight:      Height:        Intake/Output Summary (Last 24 hours) at 10/24/2018 1017 Last data filed at 10/24/2018 0834 Gross per 24 hour  Intake 1240 ml  Output -  Net 1240 ml     Wt Readings from Last 3 Encounters:  10/23/18 96.2 kg  10/15/18 99.8 kg  09/17/18 100.6 kg     Exam  General: Alert and oriented x 3, NAD  Eyes:   HEENT:  Atraumatic, normocephalic  Cardiovascular: S1 S2 auscultated, Regular rate and rhythm.  Respiratory: Clear to auscultation bilaterally, no wheezing, rales or rhonchi  Gastrointestinal: Soft, nontender, nondistended, + bowel sounds  Ext: no pedal edema bilaterally  Neuro: Alert and oriented, right-sided hemiparesis, 3-4/5 in upper and lower extremities  Musculoskeletal: No digital cyanosis, clubbing  Skin: No rashes  Psych: Bit anxious   Data Reviewed:  I have personally reviewed following labs and imaging studies  Micro Results No results found for this or any previous visit (from the past 240 hour(s)).  Radiology Reports Ct Angio Head W/cm &/or Wo Cm  Result Date: 10/23/2018 CLINICAL DATA:  62 year old code stroke presentation. Right side weakness since 0400 hours. EXAM: CT ANGIOGRAPHY HEAD AND NECK TECHNIQUE: Multidetector CT imaging of the head and neck was performed using the standard protocol during bolus administration of intravenous contrast. Multiplanar CT image reconstructions and MIPs were obtained to evaluate the vascular anatomy. Carotid stenosis measurements (when applicable) are obtained utilizing NASCET criteria, using the distal internal carotid diameter as the denominator. CONTRAST:  75mL ISOVUE-370 IOPAMIDOL (ISOVUE-370) INJECTION 76% COMPARISON:  Head CT without contrast 0846 hours today. FINDINGS: CTA NECK Skeleton: Absent dentition. Lower cervical spine disc and endplate degeneration. No acute osseous abnormality identified. 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 The left upper extremity was injected such that there is prominent reflux of venous contrast into the neck and as far as the left sigmoid sinus. Prominent paravertebral contrast reflux also. Mild upper lung atelectasis. Visible mediastinal lymph nodes are within normal limits. Other neck: Negative. No neck mass or lymphadenopathy. Aortic arch: Mild to moderate aortic arch atherosclerosis. Bovine type arch configuration (normal variant). Right carotid system: No brachiocephalic or right CCA origin stenosis despite some atherosclerosis. Similar soft plaque in the distal right CCA with no stenosis through the right carotid bifurcation. Calcified plaque in the posterior right ICA bulb without stenosis. Tortuous right ICA just below the skull base. Left carotid system: Calcified plaque at the bovine type left CCA origin with no significant stenosis. Prominent left IJ venous contrast  reflux and streak artifact mildly degrades detail of the cervical left carotid. There is evidence of bulky soft plaque in the left ICA origin and bulb on series 10, image 134 with suspected high-grade left ICA origin stenosis approaching a radiographic string sign. The left ICA remains patent to the skull base with mild additional calcified plaque, no additional stenosis. Vertebral arteries: No proximal right subclavian artery or right vertebral artery origin stenosis. Right vertebral artery detail in the neck limited by paravertebral venous contrast reflux. No right vertebral artery stenosis to the skull base is evident. No proximal left subclavian artery or left vertebral artery origin stenosis despite some atherosclerosis. Limited left vertebral detail in the neck due to prominent venous contrast reflux. The left vertebral is patent to the skull base with no stenosis evident. CTA HEAD Suboptimal intracranial arterial contrast bolus, probably related to the innominate vein  narrowing and venous reflux described above. Posterior circulation: Bilateral V4 segment calcified plaque without stenosis. Codominant distal vertebral arteries. Patent PICA origins and vertebrobasilar junction. No basilar stenosis. Mild basilar ectasia. Normal SCA and PCA origins. Posterior communicating arteries are diminutive or absent. Bilateral PCA branches are within normal limits. Anterior circulation: Both ICA siphons are patent with symmetric enhancement. Both siphons are tortuous, ectatic especially in the cavernous segments. Bilateral siphon calcified plaque but no siphon stenosis. Normal ophthalmic artery origins. Patent carotid termini. Normal MCA and ACA origins. Anterior communicating artery and bilateral ACA branches are within normal limits. Right MCA M1 segment, bifurcation, and right MCA branches are within normal limits. Left MCA M1 segment, bifurcation, and left MCA branches are within normal limits. No left MCA branch occlusion is identified to correspond to the question Vessel hyperdensity by CT today. Venous sinuses: Patent. Prominent reflux of the injected contrast into the left IJ and even into the left sigmoid sinus. Anatomic variants: Bovine aortic arch configuration. Delayed phase: No abnormal enhancement identified. Gray-white matter differentiation is stable since 0846 hours and within normal limits. Review of the MIP images confirms the above findings IMPRESSION: 1. Negative for large vessel occlusion. No left MCA branch occlusion or stenosis is evident. 2. Suspected bulky soft plaque at the Left ICA origin and bulb with High-Grade Left ICA origin stenosis approaching a RADIOGRAPHIC STRING SIGN. However, substantial venous contrast reflux in the left neck (related to anatomic left innominate vein stenosis) with streak artifact does limit detail of the left carotid. Therefore corroboration of the high-grade stenosis with Carotid Doppler US may be valuable. 3. No other hemodynamically  significant stenosis in the head or neck. Generalized arterial tortuosity. 4. Stable CT appearance of the brain since 0846 hours today. Study discussed by telephone with Dr. Vanetta Mulders on 10/23/2018 at 10:16 . Electronically Signed   By: Odessa Fleming M.D.   On: 10/23/2018 10:18   Ct Angio Neck W And/or Wo Contrast  Result Date: 10/23/2018 CLINICAL DATA:  62 year old code stroke presentation. Right side weakness since 0400 hours. EXAM: CT ANGIOGRAPHY HEAD AND NECK TECHNIQUE: Multidetector CT imaging of the head and neck was performed using the standard protocol during bolus administration of intravenous contrast. Multiplanar CT image reconstructions and MIPs were obtained to evaluate the vascular anatomy. Carotid stenosis measurements (when applicable) are obtained utilizing NASCET criteria, using the distal internal carotid diameter as the denominator. CONTRAST:  75mL ISOVUE-370 IOPAMIDOL (ISOVUE-370) INJECTION 76% COMPARISON:  Head CT without contrast 0846 hours today. FINDINGS: CTA NECK Skeleton: Absent dentition. Lower cervical spine disc and endplate degeneration. No acute osseous abnormality identified. 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 The left upper extremity was injected such that there is prominent reflux of venous contrast into the neck and as far as the left sigmoid sinus. Prominent paravertebral contrast reflux also. Mild upper lung atelectasis. Visible mediastinal lymph nodes are within normal limits. Other neck: Negative. No neck mass or lymphadenopathy. Aortic arch: Mild to moderate aortic arch atherosclerosis. Bovine type arch configuration (normal variant). Right carotid system: No brachiocephalic or right CCA origin stenosis despite some atherosclerosis. Similar soft plaque in the distal right CCA with no stenosis through the right carotid bifurcation. Calcified plaque in the posterior right ICA bulb without stenosis. Tortuous right  ICA just below the skull base. Left carotid system: Calcified plaque at the bovine type left CCA origin with no significant stenosis. Prominent left IJ venous contrast reflux and streak artifact mildly degrades detail of the cervical left carotid. There is evidence of bulky soft plaque in the left ICA origin and bulb on series 10, image 134 with suspected high-grade left ICA origin stenosis approaching a radiographic string sign. The left ICA remains patent to the skull base with mild additional calcified plaque, no additional stenosis. Vertebral arteries: No proximal right subclavian artery or right vertebral artery origin stenosis. Right vertebral artery detail in the neck limited by paravertebral venous contrast reflux. No right vertebral artery stenosis to the skull base is evident. No proximal left subclavian artery or left vertebral artery origin stenosis despite some atherosclerosis. Limited left vertebral detail in the neck due to prominent venous contrast reflux. The left vertebral is patent to the skull base with no stenosis evident. CTA HEAD Suboptimal intracranial arterial contrast bolus, probably related to the innominate vein narrowing and venous reflux described above. Posterior circulation: Bilateral V4 segment calcified plaque without stenosis. Codominant distal vertebral arteries. Patent PICA origins and vertebrobasilar junction. No basilar stenosis. Mild basilar ectasia. Normal SCA and PCA origins. Posterior communicating arteries are diminutive or absent. Bilateral PCA branches are within normal limits. Anterior circulation: Both ICA siphons are patent with symmetric enhancement. Both siphons are tortuous, ectatic especially in the cavernous segments. Bilateral siphon calcified plaque but no siphon stenosis. Normal ophthalmic artery origins. Patent carotid termini. Normal MCA and ACA origins. Anterior communicating artery and bilateral ACA branches are within normal limits. Right MCA M1 segment,  bifurcation, and right MCA branches are within normal limits. Left MCA M1 segment, bifurcation, and left MCA branches are within normal limits. No left MCA branch occlusion is identified to correspond to the question Vessel hyperdensity by CT today. Venous sinuses: Patent. Prominent reflux of the injected contrast into the left IJ and even into the left sigmoid sinus. Anatomic variants: Bovine aortic arch configuration. Delayed phase: No abnormal enhancement identified. Gray-white matter differentiation is stable since 0846 hours and within normal limits. Review of the MIP images confirms the above findings IMPRESSION: 1. Negative for large vessel occlusion. No left MCA branch occlusion or stenosis is evident. 2. Suspected bulky soft plaque at the Left ICA origin and bulb with High-Grade Left ICA origin stenosis approaching a RADIOGRAPHIC STRING SIGN. However, substantial venous contrast reflux in the left neck (related to anatomic left innominate vein stenosis) with streak artifact does limit detail of the left carotid. Therefore corroboration of the high-grade stenosis with Carotid Doppler US may be valuable. 3. No other hemodynamically significant stenosis in the head or neck. Generalized arterial tortuosity. 4. Stable CT appearance of the brain since 0846 hours today. Study  discussed by telephone with Dr. Vanetta Mulders on 10/23/2018 at 10:16 . Electronically Signed   By: Odessa Fleming M.D.   On: 10/23/2018 10:18   Ct Cerebral Perfusion W Contrast  Result Date: 10/23/2018 CLINICAL DATA:  Arterial stricture. EXAM: CT PERFUSION BRAIN TECHNIQUE: Multiphase CT imaging of the brain was performed following IV bolus contrast injection. Subsequent parametric perfusion maps were calculated using RAPID software. CONTRAST:  40mL ISOVUE-370 IOPAMIDOL (ISOVUE-370) INJECTION 76% COMPARISON:  CTA head neck from earlier today FINDINGS: CT Brain Perfusion Findings: CBF (<30%) Volume: 0mL Perfusion (Tmax>6.0s) volume: 0mL ASPECTS  on noncontrast CT Head: 10 at 8:46 a.m. today. IMPRESSION: Negative CT perfusion - no infarct or penumbra. Electronically Signed   By: Marnee Spring M.D.   On: 10/23/2018 11:51   Ct Head Code Stroke Wo Contrast  Result Date: 10/23/2018 CLINICAL DATA:  Code stroke. Right-sided weakness starting at 4 a.m. EXAM: CT HEAD WITHOUT CONTRAST TECHNIQUE: Contiguous axial images were obtained from the base of the skull through the vertex without intravenous contrast. COMPARISON:  None. FINDINGS: Brain: No evidence of acute infarction, hemorrhage, hydrocephalus, extra-axial collection or mass lesion/mass effect. Mild cerebral volume loss. Vascular: Hyperdense focus along the lower left sylvian fissure, not seen on reformats. Skull: No acute or aggressive finding. Sinuses/Orbits: Gaze is to the right. Other: These results were called by telephone at the time of interpretation on 10/23/2018 at 8:59 am to Dr. Vanetta Mulders , who verbally acknowledged these results. ASPECTS [facility] Stroke Program Early CT Score) - Ganglionic level infarction (caudate, lentiform nuclei, internal capsule, insula, M1-M3 cortex): 7 - Supraganglionic infarction (M4-M6 cortex): 3 Total score (0-10 with 10 being normal): 10 IMPRESSION: 1. No hemorrhage or visible infarct.ASPECTS is 10. 2. Equivocal hyperdense focus in the left sylvian fissure, cannot exclude M2/3 branch thrombosis. Electronically Signed   By: Marnee Spring M.D.   On: 10/23/2018 09:01    Lab Data:  CBC: Recent Labs  Lab 10/23/18 0840 10/23/18 0845 10/24/18 0330  WBC 9.0  --  7.4  NEUTROABS 5.5  --   --   HGB 13.7 13.9 13.3  HCT 42.0 41.0 41.8  MCV 91.9  --  92.1  PLT 179  --  177   Basic Metabolic Panel: Recent Labs  Lab 10/23/18 0840 10/23/18 0845 10/24/18 0330  NA 136 137 137  K 3.6 3.7 3.7  CL 104 101 109  CO2 25  --  22  GLUCOSE 122* 119* 109*  BUN 12 12 11   CREATININE 1.26* 1.30* 1.13  CALCIUM 8.8*  --  8.5*   GFR: Estimated Creatinine  Clearance: 77.2 mL/min (by C-G formula based on SCr of 1.13 mg/dL). Liver Function Tests: Recent Labs  Lab 10/23/18 0840  AST 23  ALT 21  ALKPHOS 41  BILITOT 0.8  PROT 7.6  ALBUMIN 3.9   No results for input(s): LIPASE, AMYLASE in the last 168 hours. No results for input(s): AMMONIA in the last 168 hours. Coagulation Profile: Recent Labs  Lab 10/23/18 0840  INR 0.98   Cardiac Enzymes: No results for input(s): CKTOTAL, CKMB, CKMBINDEX, TROPONINI in the last 168 hours. BNP (last 3 results) No results for input(s): PROBNP in the last 8760 hours. HbA1C: Recent Labs    10/24/18 0330  HGBA1C 5.8*   CBG: Recent Labs  Lab 10/23/18 0837  GLUCAP 115*   Lipid Profile: Recent Labs    10/24/18 0330  CHOL 119  HDL 32*  LDLCALC 63  TRIG 562  CHOLHDL 3.7  Thyroid Function Tests: Recent Labs    10/23/18 0853  TSH 4.456   Anemia Panel: No results for input(s): VITAMINB12, FOLATE, FERRITIN, TIBC, IRON, RETICCTPCT in the last 72 hours. Urine analysis:    Component Value Date/Time   COLORURINE YELLOW 10/23/2018 0841   APPEARANCEUR CLEAR 10/23/2018 0841   LABSPEC 1.010 10/23/2018 0841   PHURINE 6.0 10/23/2018 0841   GLUCOSEU NEGATIVE 10/23/2018 0841   HGBUR NEGATIVE 10/23/2018 0841   BILIRUBINUR NEGATIVE 10/23/2018 0841   KETONESUR NEGATIVE 10/23/2018 0841   PROTEINUR NEGATIVE 10/23/2018 0841   NITRITE NEGATIVE 10/23/2018 0841   LEUKOCYTESUR NEGATIVE 10/23/2018 0841     Zhanae Proffit M.D. [facility] 10/24/2018, 10:17 AM  Pager: [phone] Between 7am to 7pm - call Pager - [phone]  After 7pm go to [email or web address] - password [email or web address]  Call night coverage person covering after 7pm

## 2018-10-25 ENCOUNTER — Encounter (HOSPITAL_COMMUNITY): Payer: Self-pay | Admitting: *Deleted

## 2018-10-25 ENCOUNTER — Inpatient Hospital Stay (HOSPITAL_COMMUNITY)
Admission: RE | Admit: 2018-10-25 | Discharge: 2018-11-02 | DRG: 057 | Disposition: A | Discharge status: Home Health | Payer: Medicaid Other | Source: Intra-hospital | Attending: Physical Medicine & Rehabilitation | Admitting: Physical Medicine & Rehabilitation

## 2018-10-25 ENCOUNTER — Other Ambulatory Visit: Payer: Self-pay

## 2018-10-25 DIAGNOSIS — F17219 Nicotine dependence, cigarettes, with unspecified nicotine-induced disorders: Secondary | ICD-10-CM

## 2018-10-25 DIAGNOSIS — E871 Hypo-osmolality and hyponatremia: Secondary | ICD-10-CM

## 2018-10-25 DIAGNOSIS — E039 Hypothyroidism, unspecified: Secondary | ICD-10-CM | POA: Diagnosis present

## 2018-10-25 DIAGNOSIS — I69351 Hemiplegia and hemiparesis following cerebral infarction affecting right dominant side: Secondary | ICD-10-CM | POA: Diagnosis present

## 2018-10-25 DIAGNOSIS — Z8661 Personal history of infections of the central nervous system: Secondary | ICD-10-CM

## 2018-10-25 DIAGNOSIS — I63312 Cerebral infarction due to thrombosis of left middle cerebral artery: Secondary | ICD-10-CM

## 2018-10-25 DIAGNOSIS — Z880 Allergy status to penicillin: Secondary | ICD-10-CM | POA: Diagnosis not present

## 2018-10-25 DIAGNOSIS — E785 Hyperlipidemia, unspecified: Secondary | ICD-10-CM | POA: Diagnosis present

## 2018-10-25 DIAGNOSIS — I639 Cerebral infarction, unspecified: Secondary | ICD-10-CM

## 2018-10-25 DIAGNOSIS — I10 Essential (primary) hypertension: Secondary | ICD-10-CM | POA: Diagnosis present

## 2018-10-25 DIAGNOSIS — Z820 Family history of epilepsy and other diseases of the nervous system: Secondary | ICD-10-CM

## 2018-10-25 DIAGNOSIS — I6349 Cerebral infarction due to embolism of other cerebral artery: Secondary | ICD-10-CM

## 2018-10-25 DIAGNOSIS — Z87891 Personal history of nicotine dependence: Secondary | ICD-10-CM

## 2018-10-25 DIAGNOSIS — M25561 Pain in right knee: Secondary | ICD-10-CM | POA: Diagnosis present

## 2018-10-25 DIAGNOSIS — Z79890 Hormone replacement therapy (postmenopausal): Secondary | ICD-10-CM | POA: Diagnosis not present

## 2018-10-25 DIAGNOSIS — I739 Peripheral vascular disease, unspecified: Secondary | ICD-10-CM | POA: Diagnosis present

## 2018-10-25 DIAGNOSIS — Z8042 Family history of malignant neoplasm of prostate: Secondary | ICD-10-CM

## 2018-10-25 DIAGNOSIS — I471 Supraventricular tachycardia, unspecified: Secondary | ICD-10-CM | POA: Diagnosis present

## 2018-10-25 DIAGNOSIS — N179 Acute kidney failure, unspecified: Secondary | ICD-10-CM | POA: Diagnosis not present

## 2018-10-25 DIAGNOSIS — Z888 Allergy status to other drugs, medicaments and biological substances status: Secondary | ICD-10-CM

## 2018-10-25 DIAGNOSIS — K5900 Constipation, unspecified: Secondary | ICD-10-CM | POA: Diagnosis present

## 2018-10-25 LAB — BASIC METABOLIC PANEL
ANION GAP: 6 (ref 5–15)
BUN: 17 mg/dL (ref 8–23)
CHLORIDE: 107 mmol/L (ref 98–111)
CO2: 23 mmol/L (ref 22–32)
Calcium: 8.7 mg/dL — ABNORMAL LOW (ref 8.9–10.3)
Creatinine, Ser: 1.21 mg/dL (ref 0.61–1.24)
GFR calc non Af Amer: >60 mL/min (ref >60)
GLUCOSE: 93 mg/dL (ref 70–99)
POTASSIUM: 3.7 mmol/L (ref 3.5–5.1)
Sodium: 136 mmol/L (ref 135–145)

## 2018-10-25 MED ORDER — FLEET ENEMA 7-19 GM/118ML RE ENEM: 1.0000 | ENEMA | Freq: Once | RECTAL | Status: DC | PRN

## 2018-10-25 MED ORDER — CLOPIDOGREL BISULFATE 75 MG PO TABS: 75.0000 mg | ORAL_TABLET | Freq: Every day | ORAL | Status: DC

## 2018-10-25 MED ORDER — ACETAMINOPHEN 325 MG PO TABS: 325.0000 mg | ORAL_TABLET | ORAL | Status: DC | PRN

## 2018-10-25 MED ORDER — METOPROLOL TARTRATE 12.5 MG HALF TABLET
12.5000 mg | ORAL_TABLET | Freq: Two times a day (BID) | ORAL | Status: DC
  Administered 2018-10-25 – 2018-11-02 (×16): 12.5 mg via ORAL
  Filled 2018-10-25 (×16): qty 1

## 2018-10-25 MED ORDER — LEVOTHYROXINE SODIUM 100 MCG PO TABS
100.0000 ug | ORAL_TABLET | Freq: Every day | ORAL | Status: DC
  Administered 2018-10-26 – 2018-11-02 (×8): 100 ug via ORAL
  Filled 2018-10-25 (×8): qty 1

## 2018-10-25 MED ORDER — BACLOFEN 5 MG HALF TABLET: 5.0000 mg | ORAL_TABLET | Freq: Three times a day (TID) | ORAL | Status: DC | PRN

## 2018-10-25 MED ORDER — BISACODYL 10 MG RE SUPP: 10.0000 mg | Freq: Every day | RECTAL | Status: DC | PRN

## 2018-10-25 MED ORDER — PROCHLORPERAZINE MALEATE 5 MG PO TABS: 5.0000 mg | ORAL_TABLET | Freq: Four times a day (QID) | ORAL | Status: DC | PRN

## 2018-10-25 MED ORDER — TRAZODONE HCL 50 MG PO TABS: 25.0000 mg | ORAL_TABLET | Freq: Every evening | ORAL | Status: DC | PRN

## 2018-10-25 MED ORDER — ASPIRIN EC 81 MG PO TBEC: 81.0000 mg | DELAYED_RELEASE_TABLET | Freq: Every day | ORAL | Status: DC

## 2018-10-25 MED ORDER — DILTIAZEM HCL ER COATED BEADS 180 MG PO CP24
180.0000 mg | ORAL_CAPSULE | Freq: Every day | ORAL | Status: DC
  Administered 2018-10-26 – 2018-11-02 (×8): 180 mg via ORAL
  Filled 2018-10-25 (×8): qty 1

## 2018-10-25 MED ORDER — FLECAINIDE ACETATE 50 MG PO TABS
50.0000 mg | ORAL_TABLET | Freq: Two times a day (BID) | ORAL | Status: DC
  Administered 2018-10-25 – 2018-11-02 (×16): 50 mg via ORAL
  Filled 2018-10-25 (×16): qty 1

## 2018-10-25 MED ORDER — ALUM & MAG HYDROXIDE-SIMETH 200-200-20 MG/5ML PO SUSP: 30.0000 mL | ORAL | Status: DC | PRN

## 2018-10-25 MED ORDER — CLOPIDOGREL BISULFATE 75 MG PO TABS
75.0000 mg | ORAL_TABLET | Freq: Every day | ORAL | Status: DC
  Administered 2018-10-26 – 2018-11-02 (×8): 75 mg via ORAL
  Filled 2018-10-25 (×8): qty 1

## 2018-10-25 MED ORDER — PROCHLORPERAZINE 25 MG RE SUPP: 12.5000 mg | Freq: Four times a day (QID) | RECTAL | Status: DC | PRN

## 2018-10-25 MED ORDER — ATORVASTATIN CALCIUM 20 MG PO TABS
20.0000 mg | ORAL_TABLET | Freq: Every day | ORAL | Status: DC
  Administered 2018-10-26 – 2018-11-01 (×7): 20 mg via ORAL
  Filled 2018-10-25 (×7): qty 1

## 2018-10-25 MED ORDER — ASPIRIN EC 81 MG PO TBEC
81.0000 mg | DELAYED_RELEASE_TABLET | Freq: Every day | ORAL | Status: DC
  Administered 2018-10-26 – 2018-11-02 (×8): 81 mg via ORAL
  Filled 2018-10-25 (×8): qty 1

## 2018-10-25 MED ORDER — ASPIRIN 81 MG PO TBEC: 81.0000 mg | DELAYED_RELEASE_TABLET | Freq: Every day | ORAL | Status: DC

## 2018-10-25 MED ORDER — GUAIFENESIN-DM 100-10 MG/5ML PO SYRP: 5.0000 mL | ORAL_SOLUTION | Freq: Four times a day (QID) | ORAL | Status: DC | PRN

## 2018-10-25 MED ORDER — ASPIRIN 325 MG PO TABS: 325.0000 mg | ORAL_TABLET | Freq: Every day | ORAL | Status: DC

## 2018-10-25 MED ORDER — PROCHLORPERAZINE EDISYLATE 10 MG/2ML IJ SOLN: 5.0000 mg | Freq: Four times a day (QID) | INTRAMUSCULAR | Status: DC | PRN

## 2018-10-25 MED ORDER — POLYETHYLENE GLYCOL 3350 17 G PO PACK
17.0000 g | PACK | Freq: Every day | ORAL | Status: DC
  Administered 2018-10-26 – 2018-10-28 (×3): 17 g via ORAL
  Filled 2018-10-25 (×6): qty 1

## 2018-10-25 MED ORDER — ENOXAPARIN SODIUM 40 MG/0.4ML ~~LOC~~ SOLN
40.0000 mg | SUBCUTANEOUS | Status: DC
  Administered 2018-10-25 – 2018-11-01 (×8): 40 mg via SUBCUTANEOUS
  Filled 2018-10-25 (×8): qty 0.4

## 2018-10-25 MED ORDER — DIPHENHYDRAMINE HCL 12.5 MG/5ML PO ELIX
12.5000 mg | ORAL_SOLUTION | Freq: Four times a day (QID) | ORAL | Status: DC | PRN
  Administered 2018-10-28 – 2018-10-29 (×3): 25 mg via ORAL
  Filled 2018-10-25 (×3): qty 10

## 2018-10-25 NOTE — Care Management Note (Signed)
Case Management Note  Patient Details  Name: David Mosley MRN: [id number] Date of Birth: 09/15/1956  Subjective/Objective:                    Action/Plan: Pt discharging to CIR today. CM signing off.   Expected Discharge Date:  10/25/18               Expected Discharge Plan:  IP Rehab Facility  In-House Referral:     Discharge planning Services  CM Consult  Post Acute Care Choice:    Choice offered to:     DME Arranged:    DME Agency:     HH Arranged:    HH Agency:     Status of Service:  Completed, signed off  If discussed at [employer], dates discussed:    Additional Comments:  Kermit Balo, RN 10/25/2018, 12:27 PM

## 2018-10-25 NOTE — Discharge Summary (Signed)
Physician Discharge Summary   Patient ID: Doctor Sheahan MRN: [id number] DOB/AGE: May 23, 1956 62 y.o.  Admit date: 10/23/2018 Discharge date: 10/25/2018  Primary Care Physician:  Jacquelin Hawking, PA-C   Recommendations for Outpatient Follow-up:  1. Follow up with PCP in 1-2 weeks 2. Continue aspirin 325 mg daily, follow-up outpatient with neurology  Home Health: Patient accepted to inpatient rehab Equipment/Devices:   Discharge Condition: stable  CODE STATUS: FULL   Diet recommendation: Heart healthy diet   Discharge Diagnoses:    . 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 Essential hypertension, benign . Hypothyroidism . PVD (peripheral vascular disease) (HCC) . Cigarette nicotine dependence with nicotine-induced disorder   History of PSVT   CKD stage II  Consults: Neurology Inpatient rehab    Allergies:   Allergies  Allergen Reactions  . Amlodipine Nausea Only, Anxiety and Palpitations  . Penicillins Itching    Has patient had a PCN reaction causing immediate rash, facial/tongue/throat swelling, SOB or lightheadedness with hypotension: No Has patient had a PCN reaction causing severe rash involving mucus membranes or skin necrosis: No Has patient had a PCN reaction that required hospitalization: No Has patient had a PCN reaction occurring within the last 10 years: No\ If all of the above answers are "NO", then may proceed with Cephalosporin use.     DISCHARGE MEDICATIONS: Allergies as of 10/25/2018      Reactions   Amlodipine Nausea Only, Anxiety, Palpitations   Penicillins Itching   Has patient had a PCN reaction causing immediate rash, facial/tongue/throat swelling, SOB or lightheadedness with hypotension: No Has patient had a PCN reaction causing severe rash involving mucus membranes or skin necrosis: No Has patient had a PCN reaction that required hospitalization: No Has patient had a PCN reaction occurring within  the last 10 years: No\ If all of the above answers are "NO", then may proceed with Cephalosporin use.      Medication List    TAKE these medications   aspirin 325 MG tablet Take 1 tablet (325 mg total) by mouth daily. Start taking on:  10/26/2018   atorvastatin 20 MG tablet Commonly known as:  LIPITOR Take 1 tablet (20 mg total) by mouth daily.   diltiazem 180 MG 24 hr capsule Commonly known as:  CARDIZEM CD Take 1 capsule (180 mg total) by mouth daily.   flecainide 50 MG tablet Commonly known as:  TAMBOCOR Take 1 tablet (50 mg total) by mouth every 12 (twelve) hours.   levothyroxine 100 MCG tablet Commonly known as:  SYNTHROID, LEVOTHROID Take 1 tablet (100 mcg total) by mouth daily.   losartan 50 MG tablet Commonly known as:  COZAAR Take 1 tablet (50 mg total) by mouth daily.   metoprolol tartrate 25 MG tablet Commonly known as:  LOPRESSOR Take 1 tablet (25 mg total) by mouth 2 (two) times daily.        Brief H and P: For complete details please refer to admission H and P, but in brief Patient is a 62 y.o.malewith history of prior meningitis, hypertension, hyperlipidemia, hypothyroidism, PVD, tobacco use, presented with right-sided weakness of onset that occurred approximately between 3 and 4 AM.  Patient reported that he usually wakes up at around 3 in the morning and was doing well at that time and came downstairs to pet his cat and fell asleep shortly after only to wake up around 4 to 4:30 AM with significant weakness to his right lower extremity that is greater than his upper  extremity. He also had some mild associated numbness and tingling with no known headache, visual deficit, or speech deficits. He states that he takes his medications as otherwise prescribed and does continue to smoke on a daily basis. He was not considered to be TPA candidate by tele neurologist, transferred to [facility] for further neurological work-up  Hospital Course:   Acute right-sided  hemiparesis suspicious for acute CVA (cerebral vascular accident) (HCC) -Weakness still persisting, in the setting of high-grade stenosis to the left carotic region noted on the CTA head and neck however no abnormalities on the perfusion study -MRI of the brain showed acute nonhemorrhagic left internal capsule/basal ganglia infarct, moderate parenchymal brain volume loss -2D echo showed EF of 60 to 65%, no regional wall motion abnormalities, no cardiac source of emboli -Carotid Dopplers showed bilateral vertebral arteries demonstrate antegrade flow, right 1 to 39% stenosis, left 40 to 59% stenosis -LDL 63, hemoglobin A1c 5.8 -Neurology recommended aspirin 325 mg daily with the Lipitor -PT recommended CIR, consult placed     PVD (peripheral vascular disease) (HCC) -Continue full dose aspirin    Cigarette nicotine dependence with nicotine-induced disorder -Patient counseled on nicotine cessation -Place nicotine patch    Essential hypertension, benign -BP currently elevated however permissive hypertension due to suspected acute CVA Continue beta-blocker, losartan, Cardizem    Hypothyroidism Continue levothyroxine  History of PSVT Continue flecainide, Cardizem  CKD stage II -Creatinine currently at baseline    Day of Discharge S: Right-sided weakness still persisting, motivated for rehab  BP (!) 151/89 (BP Location: Left Arm)   Pulse (!) 59   Temp 97.7 F (36.5 C) (Oral)   Resp 16   Ht 5\' 8"  (1.727 m)   Wt 96.2 kg   SpO2 94%   BMI 32.23 kg/m   Physical Exam: General: Alert and awake oriented x3 not in any acute distress. HEENT: anicteric sclera, pupils reactive to light and accommodation CVS: S1-S2 clear no murmur rubs or gallops Chest: clear to auscultation bilaterally, no wheezing rales or rhonchi Abdomen: soft nontender, nondistended, normal bowel sounds Extremities: no cyanosis, clubbing or edema noted bilaterally Neuro: right-sided weakness 3/5.  Left  upper and lower extremity's 5/5  The results of significant diagnostics from this hospitalization (including imaging, microbiology, ancillary and laboratory) are listed below for reference.      Procedures/Studies:  Ct Angio Head W/cm &/or Wo Cm  Result Date: 10/23/2018 CLINICAL DATA:  62 year old code stroke presentation. Right side weakness since 0400 hours. EXAM: CT ANGIOGRAPHY HEAD AND NECK TECHNIQUE: Multidetector CT imaging of the head and neck was performed using the standard protocol during bolus administration of intravenous contrast. Multiplanar CT image reconstructions and MIPs were obtained to evaluate the vascular anatomy. Carotid stenosis measurements (when applicable) are obtained utilizing NASCET criteria, using the distal internal carotid diameter as the denominator. CONTRAST:  75mL ISOVUE-370 IOPAMIDOL (ISOVUE-370) INJECTION 76% COMPARISON:  Head CT without contrast 0846 hours today. FINDINGS: CTA NECK Skeleton: Absent dentition. Lower cervical spine disc and endplate degeneration. No acute osseous abnormality identified. 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 The left upper extremity was injected such that there is prominent reflux of venous contrast into the neck and as far as the left sigmoid sinus. Prominent paravertebral contrast reflux also. Mild upper lung atelectasis. Visible mediastinal lymph nodes are within normal limits. Other neck: Negative. No neck mass or lymphadenopathy. Aortic arch: Mild to moderate aortic arch atherosclerosis. Bovine type arch configuration (normal  variant). Right carotid system: No brachiocephalic or right CCA origin stenosis despite some atherosclerosis. Similar soft plaque in the distal right CCA with no stenosis through the right carotid bifurcation. Calcified plaque in the posterior right ICA bulb without stenosis. Tortuous right ICA just below the skull base. Left carotid system: Calcified  plaque at the bovine type left CCA origin with no significant stenosis. Prominent left IJ venous contrast reflux and streak artifact mildly degrades detail of the cervical left carotid. There is evidence of bulky soft plaque in the left ICA origin and bulb on series 10, image 134 with suspected high-grade left ICA origin stenosis approaching a radiographic string sign. The left ICA remains patent to the skull base with mild additional calcified plaque, no additional stenosis. Vertebral arteries: No proximal right subclavian artery or right vertebral artery origin stenosis. Right vertebral artery detail in the neck limited by paravertebral venous contrast reflux. No right vertebral artery stenosis to the skull base is evident. No proximal left subclavian artery or left vertebral artery origin stenosis despite some atherosclerosis. Limited left vertebral detail in the neck due to prominent venous contrast reflux. The left vertebral is patent to the skull base with no stenosis evident. CTA HEAD Suboptimal intracranial arterial contrast bolus, probably related to the innominate vein narrowing and venous reflux described above. Posterior circulation: Bilateral V4 segment calcified plaque without stenosis. Codominant distal vertebral arteries. Patent PICA origins and vertebrobasilar junction. No basilar stenosis. Mild basilar ectasia. Normal SCA and PCA origins. Posterior communicating arteries are diminutive or absent. Bilateral PCA branches are within normal limits. Anterior circulation: Both ICA siphons are patent with symmetric enhancement. Both siphons are tortuous, ectatic especially in the cavernous segments. Bilateral siphon calcified plaque but no siphon stenosis. Normal ophthalmic artery origins. Patent carotid termini. Normal MCA and ACA origins. Anterior communicating artery and bilateral ACA branches are within normal limits. Right MCA M1 segment, bifurcation, and right MCA branches are within normal limits.  Left MCA M1 segment, bifurcation, and left MCA branches are within normal limits. No left MCA branch occlusion is identified to correspond to the question Vessel hyperdensity by CT today. Venous sinuses: Patent. Prominent reflux of the injected contrast into the left IJ and even into the left sigmoid sinus. Anatomic variants: Bovine aortic arch configuration. Delayed phase: No abnormal enhancement identified. Gray-white matter differentiation is stable since 0846 hours and within normal limits. Review of the MIP images confirms the above findings IMPRESSION: 1. Negative for large vessel occlusion. No left MCA branch occlusion or stenosis is evident. 2. Suspected bulky soft plaque at the Left ICA origin and bulb with High-Grade Left ICA origin stenosis approaching a RADIOGRAPHIC STRING SIGN. However, substantial venous contrast reflux in the left neck (related to anatomic left innominate vein stenosis) with streak artifact does limit detail of the left carotid. Therefore corroboration of the high-grade stenosis with Carotid Doppler US may be valuable. 3. No other hemodynamically significant stenosis in the head or neck. Generalized arterial tortuosity. 4. Stable CT appearance of the brain since 0846 hours today. Study discussed by telephone with Dr. Vanetta Mulders on 10/23/2018 at 10:16 . Electronically Signed   By: Odessa Fleming M.D.   On: 10/23/2018 10:18   Ct Angio Neck W And/or Wo Contrast  Result Date: 10/23/2018 CLINICAL DATA:  62 year old code stroke presentation. Right side weakness since 0400 hours. EXAM: CT ANGIOGRAPHY HEAD AND NECK TECHNIQUE: Multidetector CT imaging of the head and neck was performed using the standard protocol during bolus administration of intravenous  contrast. Multiplanar CT image reconstructions and MIPs were obtained to evaluate the vascular anatomy. Carotid stenosis measurements (when applicable) are obtained utilizing NASCET criteria, using the distal internal carotid diameter as the  denominator. CONTRAST:  75mL ISOVUE-370 IOPAMIDOL (ISOVUE-370) INJECTION 76% COMPARISON:  Head CT without contrast 0846 hours today. FINDINGS: CTA NECK Skeleton: Absent dentition. Lower cervical spine disc and endplate degeneration. No acute osseous abnormality identified. 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 The left upper extremity was injected such that there is prominent reflux of venous contrast into the neck and as far as the left sigmoid sinus. Prominent paravertebral contrast reflux also. Mild upper lung atelectasis. Visible mediastinal lymph nodes are within normal limits. Other neck: Negative. No neck mass or lymphadenopathy. Aortic arch: Mild to moderate aortic arch atherosclerosis. Bovine type arch configuration (normal variant). Right carotid system: No brachiocephalic or right CCA origin stenosis despite some atherosclerosis. Similar soft plaque in the distal right CCA with no stenosis through the right carotid bifurcation. Calcified plaque in the posterior right ICA bulb without stenosis. Tortuous right ICA just below the skull base. Left carotid system: Calcified plaque at the bovine type left CCA origin with no significant stenosis. Prominent left IJ venous contrast reflux and streak artifact mildly degrades detail of the cervical left carotid. There is evidence of bulky soft plaque in the left ICA origin and bulb on series 10, image 134 with suspected high-grade left ICA origin stenosis approaching a radiographic string sign. The left ICA remains patent to the skull base with mild additional calcified plaque, no additional stenosis. Vertebral arteries: No proximal right subclavian artery or right vertebral artery origin stenosis. Right vertebral artery detail in the neck limited by paravertebral venous contrast reflux. No right vertebral artery stenosis to the skull base is evident. No proximal left subclavian artery or left vertebral  artery origin stenosis despite some atherosclerosis. Limited left vertebral detail in the neck due to prominent venous contrast reflux. The left vertebral is patent to the skull base with no stenosis evident. CTA HEAD Suboptimal intracranial arterial contrast bolus, probably related to the innominate vein narrowing and venous reflux described above. Posterior circulation: Bilateral V4 segment calcified plaque without stenosis. Codominant distal vertebral arteries. Patent PICA origins and vertebrobasilar junction. No basilar stenosis. Mild basilar ectasia. Normal SCA and PCA origins. Posterior communicating arteries are diminutive or absent. Bilateral PCA branches are within normal limits. Anterior circulation: Both ICA siphons are patent with symmetric enhancement. Both siphons are tortuous, ectatic especially in the cavernous segments. Bilateral siphon calcified plaque but no siphon stenosis. Normal ophthalmic artery origins. Patent carotid termini. Normal MCA and ACA origins. Anterior communicating artery and bilateral ACA branches are within normal limits. Right MCA M1 segment, bifurcation, and right MCA branches are within normal limits. Left MCA M1 segment, bifurcation, and left MCA branches are within normal limits. No left MCA branch occlusion is identified to correspond to the question Vessel hyperdensity by CT today. Venous sinuses: Patent. Prominent reflux of the injected contrast into the left IJ and even into the left sigmoid sinus. Anatomic variants: Bovine aortic arch configuration. Delayed phase: No abnormal enhancement identified. Gray-white matter differentiation is stable since 0846 hours and within normal limits. Review of the MIP images confirms the above findings IMPRESSION: 1. Negative for large vessel occlusion. No left MCA branch occlusion or stenosis is evident. 2. Suspected bulky soft plaque at the Left ICA origin and bulb with High-Grade Left ICA origin stenosis approaching  a RADIOGRAPHIC  STRING SIGN. However, substantial venous contrast reflux in the left neck (related to anatomic left innominate vein stenosis) with streak artifact does limit detail of the left carotid. Therefore corroboration of the high-grade stenosis with Carotid Doppler US may be valuable. 3. No other hemodynamically significant stenosis in the head or neck. Generalized arterial tortuosity. 4. Stable CT appearance of the brain since 0846 hours today. Study discussed by telephone with Dr. Vanetta Mulders on 10/23/2018 at 10:16 . Electronically Signed   By: Odessa Fleming M.D.   On: 10/23/2018 10:18   Mr [name] Contrast  Result Date: 10/24/2018 CLINICAL DATA:  RIGHT-sided weakness. History of LEFT ICA stenosis, meningitis, hypertension. EXAM: MRI HEAD WITHOUT CONTRAST TECHNIQUE: Multiplanar, multiecho pulse sequences of the brain and surrounding structures were obtained without intravenous contrast. COMPARISON:  CT HEAD October 23, 2018 FINDINGS: Moderately motion degraded examination. INTRACRANIAL CONTENTS: 18 x 6 mm ovoid reduced diffusion LEFT internal capsule to basal ganglia with low ADC values. No susceptibility artifact to suggest hemorrhage. Scattered subcentimeter supratentorial white matter FLAIR T2 hyperintensities. Moderate supratentorial parenchymal brain volume loss. No hydrocephalus. No suspicious parenchymal signal, masses, mass effect. No abnormal extra-axial fluid collections. No extra-axial masses. VASCULAR: Normal major intracranial vascular flow voids present at skull base. SKULL AND UPPER CERVICAL SPINE: No abnormal sellar expansion. No suspicious calvarial bone marrow signal. Craniocervical junction maintained. SINUSES/ORBITS: The mastoid air-cells and included paranasal sinuses are well-aerated.The included ocular globes and orbital contents are non-suspicious. OTHER: Patient is edentulous. IMPRESSION: 1. Moderately motion degraded examination. Acute nonhemorrhagic LEFT internal capsule/basal ganglia infarct.  2. Moderate parenchymal brain volume loss, advanced for age. 3. Mild chronic small vessel ischemic changes. 4. These results will be called to the ordering clinician or representative by the professional radiologist assistant, and communication documented in [employer] Dashboard. Electronically Signed   By: Awilda Metro M.D.   On: 10/24/2018 22:32   Ct Cerebral Perfusion W Contrast  Result Date: 10/23/2018 CLINICAL DATA:  Arterial stricture. EXAM: CT PERFUSION BRAIN TECHNIQUE: Multiphase CT imaging of the brain was performed following IV bolus contrast injection. Subsequent parametric perfusion maps were calculated using RAPID software. CONTRAST:  40mL ISOVUE-370 IOPAMIDOL (ISOVUE-370) INJECTION 76% COMPARISON:  CTA head neck from earlier today FINDINGS: CT Brain Perfusion Findings: CBF (<30%) Volume: 0mL Perfusion (Tmax>6.0s) volume: 0mL ASPECTS on noncontrast CT Head: 10 at 8:46 a.m. today. IMPRESSION: Negative CT perfusion - no infarct or penumbra. Electronically Signed   By: Marnee Spring M.D.   On: 10/23/2018 11:51   Dg Knee Right Port  Result Date: 10/25/2018 CLINICAL DATA:  Knee injury EXAM: PORTABLE RIGHT KNEE - 1-2 VIEW COMPARISON:  None. FINDINGS: No fracture or malalignment. Small knee effusion. Mild patellofemoral and lateral degenerative changes. IMPRESSION: No acute osseous abnormality. Small moderate knee effusion with arthritis. Electronically Signed   By: Jasmine Pang M.D.   On: 10/25/2018 00:07   Ct Head Code Stroke Wo Contrast  Result Date: 10/23/2018 CLINICAL DATA:  Code stroke. Right-sided weakness starting at 4 a.m. EXAM: CT HEAD WITHOUT CONTRAST TECHNIQUE: Contiguous axial images were obtained from the base of the skull through the vertex without intravenous contrast. COMPARISON:  None. FINDINGS: Brain: No evidence of acute infarction, hemorrhage, hydrocephalus, extra-axial collection or mass lesion/mass effect. Mild cerebral volume loss. Vascular: Hyperdense focus along the  lower left sylvian fissure, not seen on reformats. Skull: No acute or aggressive finding. Sinuses/Orbits: Gaze is to the right. Other: These results were called by telephone at the time of interpretation  on 10/23/2018 at 8:59 am to Dr. Vanetta Mulders , who verbally acknowledged these results. ASPECTS [facility] Stroke Program Early CT Score) - Ganglionic level infarction (caudate, lentiform nuclei, internal capsule, insula, M1-M3 cortex): 7 - Supraganglionic infarction (M4-M6 cortex): 3 Total score (0-10 with 10 being normal): 10 IMPRESSION: 1. No hemorrhage or visible infarct.ASPECTS is 10. 2. Equivocal hyperdense focus in the left sylvian fissure, cannot exclude M2/3 branch thrombosis. Electronically Signed   By: Marnee Spring M.D.   On: 10/23/2018 09:01   Vas US Carotid  Result Date: 10/24/2018 Carotid Arterial Duplex Study Indications:  CVA and Weakness. Risk Factors: Hypertension, hyperlipidemia, current smoker, PAD. Performing Technologist: Sherren Kerns RVS  Examination Guidelines: A complete evaluation includes B-mode imaging, spectral Doppler, color Doppler, and power Doppler as needed of all accessible portions of each vessel. Bilateral testing is considered an integral part of a complete examination. Limited examinations for reoccurring indications may be performed as noted.  Right Carotid Findings: +----------+--------+--------+--------+-----------+------------------+           PSV cm/sEDV cm/sStenosisDescribe   Comments           +----------+--------+--------+--------+-----------+------------------+ CCA Prox  91      24                         intimal thickening +----------+--------+--------+--------+-----------+------------------+ CCA Distal55      12                         intimal thickening +----------+--------+--------+--------+-----------+------------------+ ICA Prox  41      13              homogeneous                    +----------+--------+--------+--------+-----------+------------------+ ICA Distal123     32                                            +----------+--------+--------+--------+-----------+------------------+ ECA       131     24                                            +----------+--------+--------+--------+-----------+------------------+ +----------+--------+-------+--------+-------------------+           PSV cm/sEDV cmsDescribeArm Pressure (mmHG) +----------+--------+-------+--------+-------------------+ [id number]                                         +----------+--------+-------+--------+-------------------+ +---------+--------+--+--------+--+ VertebralPSV cm/s74EDV cm/s20 +---------+--------+--+--------+--+  Left Carotid Findings: +----------+--------+--------+--------+------------+------------------+           PSV cm/sEDV cm/sStenosisDescribe    Comments           +----------+--------+--------+--------+------------+------------------+ CCA Prox  102     18                          intimal thickening +----------+--------+--------+--------+------------+------------------+ CCA Distal60      13                          intimal thickening +----------+--------+--------+--------+------------+------------------+ ICA Prox  140     49  40-59%  heterogenousShadowing          +----------+--------+--------+--------+------------+------------------+ ICA Mid   120     39                                             +----------+--------+--------+--------+------------+------------------+ ICA Distal82      24                                             +----------+--------+--------+--------+------------+------------------+ ECA       100     13                                             +----------+--------+--------+--------+------------+------------------+ +----------+--------+--------+--------+-------------------+ SubclavianPSV cm/sEDV  cm/sDescribeArm Pressure (mmHG) +----------+--------+--------+--------+-------------------+           58                                          +----------+--------+--------+--------+-------------------+ +---------+--------+--+--------+--+ VertebralPSV cm/s44EDV cm/s12 +---------+--------+--+--------+--+  Summary: Right Carotid: Velocities in the right ICA are consistent with a 1-39% stenosis. Left Carotid: Velocities in the left ICA are consistent with a 40-59% stenosis. Vertebrals:  Bilateral vertebral arteries demonstrate antegrade flow. Subclavians: Normal flow hemodynamics were seen in bilateral subclavian              arteries. *See table(s) above for measurements and observations.  Electronically signed by Delia Heady MD on 10/24/2018 at 3:52:16 PM.    Final       LAB RESULTS: Basic Metabolic Panel: Recent Labs  Lab 10/24/18 0330 10/25/18 0410  NA 137 136  K 3.7 3.7  CL 109 107  CO2 22 23  GLUCOSE 109* 93  BUN 11 17  CREATININE 1.13 1.21  CALCIUM 8.5* 8.7*   Liver Function Tests: Recent Labs  Lab 10/23/18 0840  AST 23  ALT 21  ALKPHOS 41  BILITOT 0.8  PROT 7.6  ALBUMIN 3.9   No results for input(s): LIPASE, AMYLASE in the last 168 hours. No results for input(s): AMMONIA in the last 168 hours. CBC: Recent Labs  Lab 10/23/18 0840 10/23/18 0845 10/24/18 0330  WBC 9.0  --  7.4  NEUTROABS 5.5  --   --   HGB 13.7 13.9 13.3  HCT 42.0 41.0 41.8  MCV 91.9  --  92.1  PLT 179  --  177   Cardiac Enzymes: No results for input(s): CKTOTAL, CKMB, CKMBINDEX, TROPONINI in the last 168 hours. BNP: Invalid input(s): POCBNP CBG: Recent Labs  Lab 10/23/18 0837  GLUCAP 115*      Disposition and Follow-up:    DISPOSITION: Inpatient rehab   DISCHARGE FOLLOW-UP Follow-up Information    Marvel Plan, MD. Schedule an appointment as soon as possible for a visit in 4 week(s).   Specialty:  Neurology Contact information: [address]  [address] [place] [address] [phone]        Jacquelin Hawking, PA-C. Schedule an appointment as soon as possible for a visit in 2 week(s).   Specialty:  Physician Assistant Contact information:  [address] [place] [address] [phone]            Time coordinating discharge:  35 minutes  Signed:   Thad Ranger M.D. [facility] 10/25/2018, 12:10 PM Pager: [phone]

## 2018-10-25 NOTE — Progress Notes (Signed)
Pt d/c to 4W-14 CIR. Pt has no new concerns. Pt is stable on Rm air, report called to receiving RN on 4W

## 2018-10-25 NOTE — H&P (Signed)
Physical Medicine and Rehabilitation Admission H&P        Chief Complaint  Patient presents with  . Functional deficits due to stroke.       HPI: David Mosley is a 62 year old male with history of HTN, PSVT, meningitis, tobacco abuse; who was admitted on 10/23/2018 with numbness and tingling right upper extremity and right lower extremity as well as weakness.  UDS negative.  CT perfusion head/neck done revealing no infarct or penumbra.  CTA head/neck showed bulky soft plaque left ICA origin and bulb with high-grade left ICA stenosis approaching radiographic string sign otherwise negative for large vessel occlusion.  TPA not given as patient now a window for treatment and was transferred to [facility] for evaluation and treatment.  Carotid Dopplers done revealing 40 to 59% left ICA stenosis.  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 Dr Erlinda Hong felt that stroke likely due to small versus large vessel disease--left ICA soft plaque.  He recommended aspirin Plavix x3 weeks followed by aspirin alone.  Patient with improvement in sensation on right side however continues to be limited by right-sided weakness, deficits and gait as well as ability to carry out ADLs.  CIR recommended for follow-up therapy     Review of Systems  Constitutional: Negative for chills and fever.  HENT: Negative for hearing loss and tinnitus.   Eyes: Positive for blurred vision (has had poor vision for years. Does not drive).  Respiratory: Negative for shortness of breath.   Cardiovascular: Negative for chest pain and palpitations.  Gastrointestinal: Positive for constipation. Negative for heartburn.  Genitourinary: Negative for dysuria and urgency.  Musculoskeletal: Positive for joint pain. Negative for back pain.       RLE spasms  Neurological: Positive for focal weakness and headaches.  Psychiatric/Behavioral: The  patient is not nervous/anxious.       Past Medical History:  Diagnosis Date  . Hypertension    . Hypothyroidism    . Meningitis    . Pneumonia    . PSVT (paroxysmal supraventricular tachycardia) ([place])    . PVD (peripheral vascular disease) ([place])    . Tobacco use             Past Surgical History:  Procedure Laterality Date  . NO PAST SURGERIES               Family History  Problem Relation Age of Onset  . Alzheimer's disease Mother    . Prostate cancer Father    . Arrhythmia Sister        Social History:  Lives alone. Works in a [employer]. He reports that he used to smoke 3 PPD till a few months ago--now down to 3 packs/week. He has a 9.75 pack-year smoking history. He has never used smokeless tobacco. He reports that he does not drink alcohol or use drugs.           Allergies  Allergen Reactions  . Amlodipine Nausea Only, Anxiety and Palpitations  . Penicillins Itching      Has patient had a PCN reaction causing immediate rash, facial/tongue/throat swelling, SOB or lightheadedness with hypotension: No Has patient had a PCN reaction causing severe rash involving mucus membranes or skin necrosis: No Has patient had a PCN reaction that required hospitalization: No Has patient had a PCN reaction occurring within the last 10 years: No\ If all of the above answers  are "NO", then may proceed with Cephalosporin use.            Medications Prior to Admission  Medication Sig Dispense Refill  . atorvastatin (LIPITOR) 20 MG tablet Take 1 tablet (20 mg total) by mouth daily. 90 tablet 1  . diltiazem (CARDIZEM CD) 180 MG 24 hr capsule Take 1 capsule (180 mg total) by mouth daily. 30 capsule 6  . flecainide (TAMBOCOR) 50 MG tablet Take 1 tablet (50 mg total) by mouth every 12 (twelve) hours. 60 tablet 6  . levothyroxine (SYNTHROID, LEVOTHROID) 100 MCG tablet Take 1 tablet (100 mcg total) by mouth daily. 90 tablet 0  . losartan (COZAAR) 50 MG tablet Take 1 tablet (50 mg  total) by mouth daily. 90 tablet 1  . metoprolol tartrate (LOPRESSOR) 25 MG tablet Take 1 tablet (25 mg total) by mouth 2 (two) times daily. 180 tablet 1      Drug Regimen Review  Drug regimen was reviewed and remains appropriate with no significant issues identified   Home: Home Living Family/patient expects to be discharged to:: Private residence Living Arrangements: Alone Available Help at Discharge: Friend(s), Available PRN/intermittently Bathroom Shower/Tub: Tub/shower unit, Architectural technologist: Standard  Lives With: Alone   Functional History: Prior Function Level of Independence: Independent   Functional Status:  Mobility: Bed Mobility Overal bed mobility: Needs Assistance Bed Mobility: Supine to Sit, Sit to Supine Supine to sit: Mod assist Sit to supine: Min assist General bed mobility comments: in the chair on arrival Transfers Overall transfer level: Needs assistance Equipment used: Rolling walker (2 wheeled) Transfers: Sit to/from Stand Sit to Stand: Mod assist, +2 physical assistance, +2 safety/equipment  Lateral/Scoot Transfers: Min assist General transfer comment: cues for hand placement. Pt required assist to power up from chair. VC for safety with AD. Ambulation/Gait Ambulation/Gait assistance: Max assist, +2 physical assistance Gait Distance (Feet): 20 Feet(with 2 standing rest breaks) Assistive device: Rolling walker (2 wheeled) Gait Pattern/deviations: Step-to pattern, Decreased step length - right, Decreased step length - left, Decreased stance time - right, Decreased weight shift to left, Scissoring, Leaning posteriorly, Decreased dorsiflexion - right, Narrow base of support, Trunk flexed, Drifts right/left General Gait Details: Pt required VC/TC for proper gait pattern with RW and needed assist progressing RW. Cueing for advancing RLE and keeping RUE on RW. When making turns pt required assist progressing RW and for proper foot placement.   Max A + 2  required to prevent LOB. Gait velocity interpretation: <1.31 ft/sec, indicative of household ambulator   ADL: ADL Overall ADL's : Needs assistance/impaired Eating/Feeding: Supervision/ safety, Set up Eating/Feeding Details (indicate cue type and reason): supported sitting in recliner, needs A for opening packages and lids that are a two handed task Grooming: Minimal assistance Grooming Details (indicate cue type and reason): supported sitting in recliner Upper Body Bathing: Minimal assistance Upper Body Bathing Details (indicate cue type and reason): supported sitting in recliner Lower Body Bathing: Moderate assistance Lower Body Bathing Details (indicate cue type and reason): Mod A +2 sit<>stand from recliner (VCs for hand placement) Upper Body Dressing : Moderate assistance Upper Body Dressing Details (indicate cue type and reason): supported sitting in recliner Lower Body Dressing: Maximal assistance Lower Body Dressing Details (indicate cue type and reason): Mod A +2 sit<>stand from recliner (VCs for hand placement) Toilet Transfer: Maximal assistance, +2 for physical assistance, Ambulation Toilet Transfer Details (indicate cue type and reason): Bil HHA, VCs for weight shifting and moving RLE with tall stance before moving  LLE Toileting- Clothing Manipulation and Hygiene: Total assistance Toileting - Clothing Manipulation Details (indicate cue type and reason): Mod A +2 sit<>stand from recliner (VCs for hand placement)   Cognition: Cognition Overall Cognitive Status: Within Functional Limits for tasks assessed Arousal/Alertness: Awake/alert Orientation Level: Oriented X4 Attention: Selective Selective Attention: Appears intact Memory: Appears intact Awareness: Appears intact Cognition Arousal/Alertness: Awake/alert Behavior During Therapy: WFL for tasks assessed/performed Overall Cognitive Status: Within Functional Limits for tasks assessed General Comments: At end of session  pt stated that he thinks his R knee is broken due to hitting it yesterday; RN was notified.     Blood pressure (!) 151/89, pulse (!) 59, temperature 97.7 F (36.5 C), temperature source Oral, resp. rate 16, height '5\' 8"'  (1.727 m), weight 96.2 kg, SpO2 94 %. Physical Exam  Nursing note and vitals reviewed. Constitutional: He is oriented to person, place, and time. He appears well-developed and well-nourished.  HENT:  Head: Normocephalic and atraumatic.  Eyes: Pupils are equal, round, and reactive to light. EOM are normal.  Neck: Normal range of motion. No tracheal deviation present. No thyromegaly present.  Cardiovascular: Normal rate. Exam reveals no friction rub.  No murmur heard. Respiratory: Effort normal. No respiratory distress. He has no wheezes. He has no rales.  GI: He exhibits no distension. There is no tenderness. There is no rebound.  Musculoskeletal: He exhibits no edema or tenderness.  Neurological: He is alert and oriented to person, place, and time.  Right central 7 with mild dysarthria. Normal language. Reasonable insight and awareness. RUE 4-/5 prox to distal. RLE 4- to 4/5 prox to distal. LUE and LLE 5/5. No focal sensory changes.   Skin: Skin is warm and dry.  Psychiatric: He has a normal mood and affect. His behavior is normal. Judgment and thought content normal.      Lab Results Last 48 Hours        Results for orders placed or performed during the hospital encounter of 10/23/18 (from the past 48 hour(s))  Hemoglobin A1c     Status: Abnormal    Collection Time: 10/24/18  3:30 AM  Result Value Ref Range    Hgb A1c MFr Bld 5.8 (H) 4.8 - 5.6 %      Comment: (NOTE) Pre diabetes:          5.7%-6.4% Diabetes:              >6.4% Glycemic control for   <7.0% adults with diabetes      Mean Plasma Glucose 119.76 mg/dL      Comment: Performed at [facility], [place] [address], [place], [place] [address]  Lipid panel     Status: Abnormal    Collection Time:  10/24/18  3:30 AM  Result Value Ref Range    Cholesterol 119 0 - 200 mg/dL    Triglycerides 118 <150 mg/dL    HDL 32 (L) >40 mg/dL    Total CHOL/HDL Ratio 3.7 RATIO    VLDL 24 0 - 40 mg/dL    LDL Cholesterol 63 0 - 99 mg/dL      Comment:        Total Cholesterol/HDL:CHD Risk Coronary Heart Disease Risk Table                     Men   Women  1/2 Average Risk   3.4   3.3  Average Risk       5.0   4.4  2 X Average  Risk   9.6   7.1  3 X Average Risk  23.4   11.0        Use the calculated Patient Ratio above and the CHD Risk Table to determine the patient's CHD Risk.        ATP III CLASSIFICATION (LDL):  <100     mg/dL   Optimal  100-129  mg/dL   Near or Above                    Optimal  130-159  mg/dL   Borderline  160-189  mg/dL   High  >190     mg/dL   Very High Performed at [facility] [address], [place] [address]    CBC     Status: None    Collection Time: 10/24/18  3:30 AM  Result Value Ref Range    WBC 7.4 4.0 - 10.5 K/uL    RBC 4.54 4.22 - 5.81 MIL/uL    Hemoglobin 13.3 13.0 - 17.0 g/dL    HCT 41.8 39.0 - 52.0 %    MCV 92.1 80.0 - 100.0 fL    MCH 29.3 26.0 - 34.0 pg    MCHC 31.8 30.0 - 36.0 g/dL    RDW 13.2 11.5 - 15.5 %    Platelets 177 150 - 400 K/uL    nRBC 0.0 0.0 - 0.2 %      Comment: Performed at [facility], [place] [address] [place], [place] [address]  Basic metabolic panel     Status: Abnormal    Collection Time: 10/24/18  3:30 AM  Result Value Ref Range    Sodium 137 135 - 145 mmol/L    Potassium 3.7 3.5 - 5.1 mmol/L    Chloride 109 98 - 111 mmol/L    CO2 22 22 - 32 mmol/L    Glucose, Bld 109 (H) 70 - 99 mg/dL    BUN 11 8 - 23 mg/dL    Creatinine, Ser 1.13 0.61 - 1.24 mg/dL    Calcium 8.5 (L) 8.9 - 10.3 mg/dL    GFR calc non Af Amer >60 >60 mL/min    GFR calc Af Amer >60 >60 mL/min      Comment: (NOTE) The eGFR has been calculated using the CKD EPI equation. This calculation has not been validated in all clinical  situations. eGFR's persistently <60 mL/min signify possible Chronic Kidney Disease.      Anion gap 6 5 - 15      Comment: Performed at [facility] [address] [place], [place] [address]  Basic metabolic panel     Status: Abnormal    Collection Time: 10/25/18  4:10 AM  Result Value Ref Range    Sodium 136 135 - 145 mmol/L    Potassium 3.7 3.5 - 5.1 mmol/L    Chloride 107 98 - 111 mmol/L    CO2 23 22 - 32 mmol/L    Glucose, Bld 93 70 - 99 mg/dL    BUN 17 8 - 23 mg/dL    Creatinine, Ser 1.21 0.61 - 1.24 mg/dL    Calcium 8.7 (L) 8.9 - 10.3 mg/dL    GFR calc non Af Amer >60 >60 mL/min    GFR calc Af Amer >60 >60 mL/min      Comment: (NOTE) The eGFR has been calculated using the CKD EPI equation. This calculation has not been validated in all clinical situations. eGFR's persistently <60  mL/min signify possible Chronic Kidney Disease.      Anion gap 6 5 - 15      Comment: Performed at [facility] [address] [place],  [address]       Imaging Results (Last 48 hours)  Mr [name] Contrast   Result Date: 10/24/2018 CLINICAL DATA:  RIGHT-sided weakness. History of LEFT ICA stenosis, meningitis, hypertension. EXAM: MRI HEAD WITHOUT CONTRAST TECHNIQUE: Multiplanar, multiecho pulse sequences of the brain and surrounding structures were obtained without intravenous contrast. COMPARISON:  CT HEAD October 23, 2018 FINDINGS: Moderately motion degraded examination. INTRACRANIAL CONTENTS: 18 x 6 mm ovoid reduced diffusion LEFT internal capsule to basal ganglia with low ADC values. No susceptibility artifact to suggest hemorrhage. Scattered subcentimeter supratentorial white matter FLAIR T2 hyperintensities. Moderate supratentorial parenchymal brain volume loss. No hydrocephalus. No suspicious parenchymal signal, masses, mass effect. No abnormal extra-axial fluid collections. No extra-axial masses. VASCULAR: Normal major intracranial vascular flow voids present at skull  base. SKULL AND UPPER CERVICAL SPINE: No abnormal sellar expansion. No suspicious calvarial bone marrow signal. Craniocervical junction maintained. SINUSES/ORBITS: The mastoid air-cells and included paranasal sinuses are well-aerated.The included ocular globes and orbital contents are non-suspicious. OTHER: Patient is edentulous. IMPRESSION: 1. Moderately motion degraded examination. Acute nonhemorrhagic LEFT internal capsule/basal ganglia infarct. 2. Moderate parenchymal brain volume loss, advanced for age. 3. Mild chronic small vessel ischemic changes. 4. These results will be called to the ordering clinician or representative by the professional radiologist assistant, and communication documented in [facility] Dashboard. Electronically Signed   By: Elon Alas M.D.   On: 10/24/2018 22:32    Dg Knee Right Port   Result Date: 10/25/2018 CLINICAL DATA:  Knee injury EXAM: PORTABLE RIGHT KNEE - 1-2 VIEW COMPARISON:  None. FINDINGS: No fracture or malalignment. Small knee effusion. Mild patellofemoral and lateral degenerative changes. IMPRESSION: No acute osseous abnormality. Small moderate knee effusion with arthritis. Electronically Signed   By: Donavan Foil M.D.   On: 10/25/2018 00:07    Vas US Carotid   Result Date: 10/24/2018 Carotid Arterial Duplex Study Indications:  CVA and Weakness. Risk Factors: Hypertension, hyperlipidemia, current smoker, PAD. Performing Technologist: Sharion Dove RVS  Examination Guidelines: A complete evaluation includes B-mode imaging, spectral Doppler, color Doppler, and power Doppler as needed of all accessible portions of each vessel. Bilateral testing is considered an integral part of a complete examination. Limited examinations for reoccurring indications may be performed as noted.  Right Carotid Findings: +----------+--------+--------+--------+-----------+------------------+           PSV cm/sEDV cm/sStenosisDescribe   Comments            +----------+--------+--------+--------+-----------+------------------+ CCA Prox  91      24                         intimal thickening +----------+--------+--------+--------+-----------+------------------+ CCA Distal55      12                         intimal thickening +----------+--------+--------+--------+-----------+------------------+ ICA Prox  41      13              homogeneous                   +----------+--------+--------+--------+-----------+------------------+ ICA Distal123     32                                            +----------+--------+--------+--------+-----------+------------------+  ECA       131     24                                            +----------+--------+--------+--------+-----------+------------------+ +----------+--------+-------+--------+-------------------+           PSV cm/sEDV cmsDescribeArm Pressure (mmHG) +----------+--------+-------+--------+-------------------+ [id number]                                         +----------+--------+-------+--------+-------------------+ +---------+--------+--+--------+--+ VertebralPSV cm/s74EDV cm/s20 +---------+--------+--+--------+--+  Left Carotid Findings: +----------+--------+--------+--------+------------+------------------+           PSV cm/sEDV cm/sStenosisDescribe    Comments           +----------+--------+--------+--------+------------+------------------+ CCA Prox  102     18                          intimal thickening +----------+--------+--------+--------+------------+------------------+ CCA Distal60      13                          intimal thickening +----------+--------+--------+--------+------------+------------------+ ICA Prox  140     49      40-59%  heterogenousShadowing          +----------+--------+--------+--------+------------+------------------+ ICA Mid   120     39                                              +----------+--------+--------+--------+------------+------------------+ ICA Distal82      24                                             +----------+--------+--------+--------+------------+------------------+ ECA       100     13                                             +----------+--------+--------+--------+------------+------------------+ +----------+--------+--------+--------+-------------------+ SubclavianPSV cm/sEDV cm/sDescribeArm Pressure (mmHG) +----------+--------+--------+--------+-------------------+           58                                          +----------+--------+--------+--------+-------------------+ +---------+--------+--+--------+--+ VertebralPSV cm/s44EDV cm/s12 +---------+--------+--+--------+--+  Summary: Right Carotid: Velocities in the right ICA are consistent with a 1-39% stenosis. Left Carotid: Velocities in the left ICA are consistent with a 40-59% stenosis. Vertebrals:  Bilateral vertebral arteries demonstrate antegrade flow. Subclavians: Normal flow hemodynamics were seen in bilateral subclavian              arteries. *See table(s) above for measurements and observations.  Electronically signed by Antony Contras MD on 10/24/2018 at 3:52:16 PM.    Final              Medical Problem List and Plan: 1.  Functional deficits secondary to Left internal capsule/basal ganglia stroke.              -  admit to inpatient rehab 2.  DVT Prophylaxis/Anticoagulation: Pharmaceutical: Lovenox 3. Pain Management: Tylenol and ice as needed for right knee pain.  Add baclofen as needed for spasms. 4. Mood: LCSW to follow for evaluation and support. 5. Neuropsych: This patient is capable of making decisions on  own behalf. 6. Skin/Wound Care: Routine pressure relief measures.  Maintain adequate nutrition and hydration status. 7. Fluids/Electrolytes/Nutrition: Monitor I's and O's.  Will order double portions as patient reporting hunger.  Check CMET in a.m. 8.   Hypertension: Monitor blood pressures twice daily.  Continue Cardizem and Metoprolol. 9. History of PSVT: Monitor heart rate twice daily basis.  Continue Cardizem, Metoprolol and Tambocor 10.  Tobacco abuse: Has been weaning himself in the past few months.  Will continue nicotine patch. 11. Dyslipidemia: on Lipitor.  12. Constipation: Start Miralax.   Post Admission Physician Evaluation: 1. Functional deficits secondary  to left internal capsule/basal ganlgia infarct. 2. Patient is admitted to receive collaborative, interdisciplinary care between the physiatrist, rehab nursing staff, and therapy team. 3. Patient's level of medical complexity and substantial therapy needs in context of that medical necessity cannot be provided at a lesser intensity of care such as a SNF. 4. Patient has experienced substantial functional loss from his/her baseline which was documented above under the "Functional History" and "Functional Status" headings.  Judging by the patient's diagnosis, physical exam, and functional history, the patient has potential for functional progress which will result in measurable gains while on inpatient rehab.  These gains will be of substantial and practical use upon discharge  in facilitating mobility and self-care at the household level. 5. Physiatrist will provide 24 hour management of medical needs as well as oversight of the therapy plan/treatment and provide guidance as appropriate regarding the interaction of the two. 6. The Preadmission Screening has been reviewed and patient status is unchanged unless otherwise stated above. 7. 24 hour rehab nursing will assist with bladder management, bowel management, safety, skin/wound care, disease management, medication administration, pain management and patient education  and help integrate therapy concepts, techniques,education, etc. 8. PT will assess and treat for/with: Lower extremity strength, range of motion, stamina, balance,  functional mobility, safety, adaptive techniques and equipment, NMR, community reentry.   Goals are: mod I. 9. OT will assess and treat for/with: ADL's, functional mobility, safety, upper extremity strength, adaptive techniques and equipment, NMR, community reentry.   Goals are: mod I. Therapy may proceed with showering this patient. 10. SLP will assess and treat for/with: n/a.  Goals are: n/a. 11. Case Management and Social Worker will assess and treat for psychological issues and discharge planning. 12. Team conference will be held weekly to assess progress toward goals and to determine barriers to discharge. 13. Patient will receive at least 3 hours of therapy per day at least 5 days per week. 14. ELOS: 10-14 days       15. Prognosis:  excellent     I have personally performed a face to face diagnostic evaluation of this patient and formulated the key components of the plan.  Additionally, I have personally reviewed laboratory data, imaging studies, as well as relevant notes and concur with the physician assistant's documentation above.   Meredith Staggers, MD, Mellody Drown     Bary Leriche, PA-C 10/25/2018

## 2018-10-25 NOTE — Progress Notes (Signed)
Physical Therapy Treatment Patient Details Name: David Mosley MRN: [id number] DOB: Aug 09, 1956 Today's Date: 10/25/2018    History of Present Illness Pt is a 62 y/o male admitted secondary to R sided weakness. CT negative for acute abnormality, MRI pending. 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 PMH includes PVD, HTN, and tobacco abuse.     PT Comments    Pt was in chair upon arrival. Pt showed therapist that he can move his RUE to touch his head and extended his RLE. Pt seemed motivated this session, but gets frustrated easily. During gait with RW pt required 2 standing rest breaks and multiple VC/TC for safety with AD. Pt stated he wanted knee brace at the end of session because he thought he broke it yesterday after hitting it during a transfer. RN was notified, and she stated it was confirmed via Xray that it wasn't broken just inflamed. Pt continues to progress toward stated goals and would benefit from continued PT in order to maximize functional independence. Pt still seems appropriate for d/c to CIR based on current functional status.      Follow Up Recommendations  CIR;Supervision for mobility/OOB     Equipment Recommendations  None recommended by PT    Recommendations for Other Services Rehab consult     Precautions / Restrictions Precautions Precautions: Fall Precaution Comments: Permissive HTN 220/110 per neurology consult note Restrictions Weight Bearing Restrictions: No    Mobility  Bed Mobility               General bed mobility comments: in the chair on arrival  Transfers Overall transfer level: Needs assistance Equipment used: Rolling walker (2 wheeled) Transfers: Sit to/from Stand Sit to Stand: Mod assist;+2 physical assistance;+2 safety/equipment         General transfer comment: cues for hand placement. Pt required assist to power up from chair. VC for safety with AD.  Ambulation/Gait Ambulation/Gait assistance: Max assist;+2 physical  assistance Gait Distance (Feet): 20 Feet(with 2 standing rest breaks) Assistive device: Rolling walker (2 wheeled) Gait Pattern/deviations: Step-to pattern;Decreased step length - right;Decreased step length - left;Decreased stance time - right;Decreased weight shift to left;Scissoring;Leaning posteriorly;Decreased dorsiflexion - right;Narrow base of support;Trunk flexed;Drifts right/left     General Gait Details: Pt required VC/TC for proper gait pattern with RW and needed assist progressing RW. Cueing for advancing RLE and keeping RUE on RW. When making turns pt required assist progressing RW and for proper foot placement.   Max A + 2 required to prevent LOB.   Stairs             Wheelchair Mobility    Modified Rankin (Stroke Patients Only)       Balance Overall balance assessment: Needs assistance Sitting-balance support: Single extremity supported;Feet supported Sitting balance-Leahy Scale: Poor Sitting balance - Comments: R lateral lean with dynamic activities. Reliant on LUE support.      Standing balance-Leahy Scale: Poor Standing balance comment: reliant on the RW and external support                            Cognition Arousal/Alertness: Awake/alert Behavior During Therapy: WFL for tasks assessed/performed Overall Cognitive Status: Within Functional Limits for tasks assessed                                 General Comments: At end of session pt stated  that he thinks his R knee is broken due to hitting it yesterday; RN was notified.      Exercises Total Joint Exercises Ankle Circles/Pumps: AAROM;10 reps;Right;Seated Long Arc Quad: AROM;10 reps;Right;Left;Seated Other Exercises Other Exercises: Pt while supine in bed (had just gotten back to bed) worked on [employer] activities (reaching for objects, releasing objects, and opening closing objects). Pt tends to hold his breath with all movements of his RUE. Sometimes you can see glimpese of  normal movement of arm elbow distally and other times it is a struggle. Shoulder movements are always increased effort and time.    General Comments        Pertinent Vitals/Pain Pain Assessment: Faces Pain Score: 6  Faces Pain Scale: Hurts little more Pain Location: R knee  Pain Descriptors / Indicators: Spasm Pain Intervention(s): Limited activity within patient's tolerance;Monitored during session;Repositioned;Ice applied(xray negative for fx (OA and effusion present))    Home Living                      Prior Function            PT Goals (current goals can now be found in the care plan section) Acute Rehab PT Goals Patient Stated Goal: to regain independence and get back to work  PT Goal Formulation: With patient Time For Goal Achievement: 11/06/18 Potential to Achieve Goals: Good Progress towards PT goals: Progressing toward goals    Frequency    Min 4X/week      PT Plan Current plan remains appropriate    Co-evaluation              AM-PAC PT "6 Clicks" Daily Activity  Outcome Measure  Difficulty turning over in bed (including adjusting bedclothes, sheets and blankets)?: A Lot Difficulty moving from lying on back to sitting on the side of the bed? : Unable   Help needed moving to and from a bed to chair (including a wheelchair)?: A Lot Help needed walking in hospital room?: A Lot Help needed climbing 3-5 steps with a railing? : A Lot 6 Click Score: 9    End of Session Equipment Utilized During Treatment: Gait belt Activity Tolerance: Patient tolerated treatment well Patient left: in chair;with call bell/phone within reach;with chair alarm set Nurse Communication: Mobility status;Patient requests pain meds PT Visit Diagnosis: Unsteadiness on feet (R26.81);Muscle weakness (generalized) (M62.81)     Time: 0981-1914 PT Time Calculation (min) (ACUTE ONLY): 26 min  Charges:  $Gait Training: 8-22 mins $Therapeutic Activity: 8-22 mins                     [address], SPTA  [place] 10/25/2018, 1:54 PM

## 2018-10-25 NOTE — Progress Notes (Signed)
[name], RN  Rehab Admission Coordinator  Physical Medicine and Rehabilitation  PMR Pre-admission  Signed  Date of Service:  10/25/2018 12:26 PM       Related encounter: ED to Hosp-Admission (Current) from 10/23/2018 in [facility]      Signed         Show:Clear all [x] Manual[x] Template[x] Copied  Added by: [x] Oaklynn Stierwalt, Ellouise Newer, RN  [] Hover for details PMR Admission Coordinator Pre-Admission Assessment  Patient: David Mosley is an 62 y.o., male MRN: [id number] DOB: 18-Mar-1956 Height: 5\' 8"  (172.7 cm) Weight: 96.2 kg                                                                                                                                                  Insurance Information Self pay - no insurance  Medicaid Application Date:        Case Manager:   Disability Application Date:        Case Worker:    Emergency Conservator, museum/gallery Information    Name Relation Home Work [place], [place] F Sister [phone]       Current Medical History  Patient Admitting Diagnosis: L CVA  History of Present Illness: A 62 y.o.malewith history of HTN, PSVT meningitis, tobacco use;who was admitted on 10/23/2018 with numbness and tingling of right upper extremity and right leg as well as weakness.UDS negative CT perfusion head/neck done revealing no infarct or penumbra.CTA head neck showed bulky soft plaque at left ICA origin and bulb with high-grade left ICA origin stenosis approaching radiographic string sign otherwise negative for large vessel occlusion. TPA not given as out of window for treatment and he was transferred to [facility] for evaluation and treatment.Work-up underway and CIR recommended due to functional deficits.  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  SLP saw patient and has signed off.  Complete NIHSS TOTAL: 4  Past Medical History      Past Medical History:  Diagnosis Date  . Hypertension   .  Hypothyroidism   . Meningitis   . Pneumonia   . PSVT (paroxysmal supraventricular tachycardia) (HCC)   . PVD (peripheral vascular disease) (HCC)   . Tobacco use     Family History  family history includes Alzheimer's disease in his mother; Arrhythmia in his sister; Prostate cancer in his father.  Prior Rehab/Hospitalizations:  Has the patient had major surgery during 100 days prior to admission? No  Current Medications   Current Facility-Administered Medications:  .  acetaminophen (TYLENOL) tablet 650 mg, 650 mg, Oral, Q4H PRN **OR** acetaminophen (TYLENOL) solution 650 mg, 650 mg, Per Tube, Q4H PRN **OR** acetaminophen (TYLENOL) suppository 650 mg, 650 mg, Rectal, Q4H PRN, Sherryll Burger, Pratik D, DO .  [DISCONTINUED] aspirin suppository 300 mg, 300 mg, Rectal, Daily **OR** aspirin tablet 325 mg, 325 mg, Oral,  Daily, Sherryll Burger, Pratik D, DO, 325 mg at 10/25/18 1116 .  atorvastatin (LIPITOR) tablet 20 mg, 20 mg, Oral, q1800, Marvel Plan, MD .  diltiazem (CARDIZEM CD) 24 hr capsule 180 mg, 180 mg, Oral, Daily, Marvel Plan, MD, 180 mg at 10/25/18 1116 .  flecainide (TAMBOCOR) tablet 50 mg, 50 mg, Oral, Q12H, Sherryll Burger, Pratik D, DO, 50 mg at 10/25/18 1117 .  heparin injection 5,000 Units, 5,000 Units, Subcutaneous, Q8H, Shah, Pratik D, DO, 5,000 Units at 10/25/18 0656 .  hydrALAZINE (APRESOLINE) injection 10 mg, 10 mg, Intravenous, Q4H PRN, Sherryll Burger, Pratik D, DO .  levothyroxine (SYNTHROID, LEVOTHROID) tablet 100 mcg, 100 mcg, Oral, QAC breakfast, Sherryll Burger, Pratik D, DO, 100 mcg at 10/25/18 1116 .  metoprolol tartrate (LOPRESSOR) tablet 12.5 mg, 12.5 mg, Oral, BID, Marvel Plan, MD, 12.5 mg at 10/25/18 1116 .  nicotine (NICODERM CQ - dosed in mg/24 hours) patch 21 mg, 21 mg, Transdermal, Daily, Rai, Ripudeep K, MD  Patients Current Diet:     Diet Order                  Diet Heart Room service appropriate? Yes; Fluid consistency: Thin  Diet effective now               Precautions /  Restrictions Precautions Precautions: Fall Precaution Comments: Permissive HTN 220/110 per neurology consult note Restrictions Weight Bearing Restrictions: No   Has the patient had 2 or more falls or a fall with injury in the past year?  No falls  Prior Activity Level Community (5-7x/wk): Went out daily.  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  Not driving due to vision impairment.  Home Assistive Devices / Equipment Home Assistive Devices/Equipment: Eyeglasses  Prior Device Use: Indicate devices/aids used by the patient prior to current illness, exacerbation or injury? None  Prior Functional Level Prior Function Level of Independence: Independent  Self Care: Did the patient need help bathing, dressing, using the toilet or eating?  Independent  Indoor Mobility: Did the patient need assistance with walking from room to room (with or without device)? Independent  Stairs: Did the patient need assistance with internal or external stairs (with or without device)? Independent  Functional Cognition: Did the patient need help planning regular tasks such as shopping or remembering to take medications? Independent  Current Functional Level Cognition  Arousal/Alertness: Awake/alert Overall Cognitive Status: Within Functional Limits for tasks assessed Orientation Level: Oriented X4 General Comments: At end of session pt stated that he thinks his R knee is broken due to hitting it yesterday; RN was notified. Attention: Selective Selective Attention: Appears intact Memory: Appears intact Awareness: Appears intact    Extremity Assessment (includes Sensation/Coordination)  Upper Extremity Assessment: RUE deficits/detail RUE Deficits / Details: While seated in recliner pt able with increased time and effort pt able to extend elbow and reach for my hand, pt also with increased effort and time able to raise arm over head with elbow bent, can extend and close hand and  even "give the bird" (he wanted to show [place] he could do this). Synergistic patterns RUE Coordination: decreased fine motor, decreased gross motor  Lower Extremity Assessment: RLE deficits/detail RLE Deficits / Details: 2/5 on ankle DF; 3-/5 on knee extensors and 2+/5 on hip flexors.  RLE Coordination: decreased gross motor    ADLs  Overall ADL's : Needs assistance/impaired Eating/Feeding: Supervision/ safety, Set up Eating/Feeding Details (indicate cue type and reason): supported sitting in recliner, needs A for opening packages and  lids that are a two handed task Grooming: Minimal assistance Grooming Details (indicate cue type and reason): supported sitting in recliner Upper Body Bathing: Minimal assistance Upper Body Bathing Details (indicate cue type and reason): supported sitting in recliner Lower Body Bathing: Moderate assistance Lower Body Bathing Details (indicate cue type and reason): Mod A +2 sit<>stand from recliner (VCs for hand placement) Upper Body Dressing : Moderate assistance Upper Body Dressing Details (indicate cue type and reason): supported sitting in recliner Lower Body Dressing: Maximal assistance Lower Body Dressing Details (indicate cue type and reason): Mod A +2 sit<>stand from recliner (VCs for hand placement) Toilet Transfer: Maximal assistance, +2 for physical assistance, Ambulation Toilet Transfer Details (indicate cue type and reason): Bil HHA, VCs for weight shifting and moving RLE with tall stance before moving LLE Toileting- Clothing Manipulation and Hygiene: Total assistance Toileting - Clothing Manipulation Details (indicate cue type and reason): Mod A +2 sit<>stand from recliner (VCs for hand placement)    Mobility  Overal bed mobility: Needs Assistance Bed Mobility: Supine to Sit, Sit to Supine Supine to sit: Mod assist Sit to supine: Min assist General bed mobility comments: in the chair on arrival    Transfers  Overall transfer level: Needs  assistance Equipment used: Rolling walker (2 wheeled) Transfers: Sit to/from Stand Sit to Stand: Mod assist, +2 physical assistance, +2 safety/equipment  Lateral/Scoot Transfers: Min assist General transfer comment: cues for hand placement. Pt required assist to power up from chair. VC for safety with AD.    Ambulation / Gait / Stairs / Wheelchair Mobility  Ambulation/Gait Ambulation/Gait assistance: Max assist, +2 physical assistance Gait Distance (Feet): 20 Feet(with 2 standing rest breaks) Assistive device: Rolling walker (2 wheeled) Gait Pattern/deviations: Step-to pattern, Decreased step length - right, Decreased step length - left, Decreased stance time - right, Decreased weight shift to left, Scissoring, Leaning posteriorly, Decreased dorsiflexion - right, Narrow base of support, Trunk flexed, Drifts right/left General Gait Details: Pt required VC/TC for proper gait pattern with RW and needed assist progressing RW. Cueing for advancing RLE and keeping RUE on RW. When making turns pt required assist progressing RW and for proper foot placement.   Max A + 2 required to prevent LOB. Gait velocity interpretation: <1.31 ft/sec, indicative of household ambulator    Posture / Balance Dynamic Sitting Balance Sitting balance - Comments: R lateral lean with dynamic activities. Reliant on LUE support.  Balance Overall balance assessment: Needs assistance Sitting-balance support: Single extremity supported, Feet supported Sitting balance-Leahy Scale: Poor Sitting balance - Comments: R lateral lean with dynamic activities. Reliant on LUE support.  Standing balance support: Bilateral upper extremity supported Standing balance-Leahy Scale: Poor Standing balance comment: reliant on the RW and external support    Special needs/care consideration BiPAP/CPAP No CPM No Continuous Drip IV No Dialysis No        Life Vest No Oxygen No Special Bed No Trach Size no Wound Vac (area) no    Skin  No                              Bowel mgmt: Last BM 10/23/18 Bladder mgmt: Voiding in urinal Diabetic mgmt No    Previous Home Environment Living Arrangements: Alone  Lives With: Alone Available Help at Discharge: Friend(s), Available PRN/intermittently Bathroom Shower/Tub: Tub/shower unit, Engineer, building services: Standard Home Care Services: No  Discharge Living Setting Plans for Discharge Living Setting: Alone, House(Lives alone.) Type of Home  at Discharge: House Discharge Home Layout: One level Discharge Home Access: Stairs to enter Entrance Stairs-Number of Steps: 2 Discharge Bathroom Shower/Tub: Walk-in shower, Curtain Discharge Bathroom Toilet: Handicapped height Discharge Bathroom Accessibility: Yes How Accessible: Accessible via walker Does the patient have any problems obtaining your medications?: No  Social/Family/Support Systems Patient Roles: Parent, Other (Comment)(Has an ex-wife, sister, 2 sons and 1 daughter) Contact Information: Orpah Greek - sister - [phone] Anticipated Caregiver: Self Ability/Limitations of Caregiver: Patient does not want to reach out to others.  Encouraged him to ask for help at discharge from family/friends Caregiver Availability: Intermittent(Patient will have to ask for help.  He lives alone.) Discharge Plan Discussed with Primary Caregiver: Yes(discussed plan with patient.) Is Caregiver In Agreement with Plan?: Yes(Patient is in agreement with plan.) Does Caregiver/Family have Issues with Lodging/Transportation while Pt is in Rehab?: No  Goals/Additional Needs Patient/Family Goal for Rehab: PT/OT mod I goals Expected length of stay: 10-14 days Cultural Considerations: [employer] Dietary Needs: Heart diet, thin liquids Equipment Needs: TBD Pt/Family Agrees to Admission and willing to participate: Yes Program Orientation Provided & Reviewed with Pt/Caregiver Including Roles  & Responsibilities: Yes  Decrease burden of Care  through IP rehab admission: N/A  Possible need for SNF placement upon discharge: Not planned  Patient Condition: This patient's condition remains as documented in the consult dated 10/24/18, in which the Rehabilitation Physician determined and documented that the patient's condition is appropriate for intensive rehabilitative care in an inpatient rehabilitation facility. Patient currently requiring mod assist for transfers and max assist +2 to ambulate 18 feet RW.   Will admit to inpatient rehab today.  Preadmission Screen Completed By:  [name], 10/25/2018 12:38 PM ______________________________________________________________________   Discussed status with Dr. Riley Kill on 10/25/18 at 1238 and received telephone approval for admission today.  Admission Coordinator:  [name], time 1238/Date 10/25/18           Cosigned by: Ranelle Oyster, MD at 10/25/2018 1:32 PM  Revision History

## 2018-10-25 NOTE — Progress Notes (Signed)
Occupational Therapy Treatment Patient Details Name: David Mosley MRN: [id number] DOB: 1956-01-24 Today's Date: 10/25/2018    History of present illness Pt is a 62 y/o male admitted secondary to R sided weakness. CT negative for acute abnormality, MRI pending. 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 PMH includes PVD, HTN, and tobacco abuse.    OT comments  This 62 yo male making progress with use of RUE with increased effort and time. Pt is very motivated to get back to taking care of himself. He will greatly benefit from acute OT with follow up OT on CIR.   Follow Up Recommendations  Supervision/Assistance - 24 hour;CIR    Equipment Recommendations  Other (comment)(TBD at next venue)       Precautions / Restrictions Precautions Precautions: Fall Precaution Comments: Permissive HTN 220/110 per neurology consult note Restrictions Weight Bearing Restrictions: No              ADL either performed or assessed with clinical judgement        Vision Baseline Vision/History: Wears glasses Wears Glasses: At all times Patient Visual Report: No change from baseline            Cognition Arousal/Alertness: Awake/alert Behavior During Therapy: WFL for tasks assessed/performed Overall Cognitive Status: Within Functional Limits for tasks assessed                                        Exercises  Other Exercises Other Exercises: Pt while supine in bed (had just gotten back to bed) worked on [employer] activities (reaching for objects, releasing objects, and opening closing objects). Pt tends to hold his breath with all movements of his RUE. Sometimes you can see glimpese of normal movement of arm elbow distally and other times it is a struggle. Shoulder movements are always increased effort and time.           Pertinent Vitals/ Pain       Pain Assessment: Faces Pain Score: 6  Faces Pain Scale: Hurts little more Pain Location: R knee  Pain Descriptors /  Indicators: Spasm Pain Intervention(s): Limited activity within patient's tolerance;Monitored during session;Repositioned;Ice applied(xray negative for fx (OA and effusion present))     Prior Functioning/Environment              Frequency  Min 3X/week        Progress Toward Goals  OT Goals(current goals can now be found in the care plan section)  Progress towards OT goals: Progressing toward goals  Acute Rehab OT Goals Patient Stated Goal: to regain independence and get back to work   Plan Discharge plan needs to be updated       AM-PAC PT "6 Clicks" Daily Activity     Outcome Measure   Help from another person eating meals?: None Help from another person taking care of personal grooming?: A Little Help from another person toileting, which includes using toliet, bedpan, or urinal?: A Lot Help from another person bathing (including washing, rinsing, drying)?: A Lot Help from another person to put on and taking off regular upper body clothing?: A Lot Help from another person to put on and taking off regular lower body clothing?: A Lot 6 Click Score: 15    End of Session    OT Visit Diagnosis: Unsteadiness on feet (R26.81);Other abnormalities of gait and mobility (R26.89);Muscle weakness (generalized) (M62.81);Hemiplegia and hemiparesis Hemiplegia -  Right/Left: Right Hemiplegia - dominant/non-dominant: Dominant Hemiplegia - caused by: Cerebral infarction   Activity Tolerance Patient tolerated treatment well   Patient Left in bed;with call bell/phone within reach;with bed alarm set   Nurse Communication          Time: 0981-1914 OT Time Calculation (min): 33 min  Charges: OT General Charges $OT Visit: 1 Visit OT Treatments $Therapeutic Exercise: 23-37 mins  Ignacia Palma, OTR/L Acute [employer] Pager [phone] Office [phone]      Evette Georges 10/25/2018, 1:50 PM

## 2018-10-25 NOTE — Progress Notes (Signed)
David Oyster, MD  Physician  Physical Medicine and Rehabilitation  Consult Note  Signed  Date of Service:  10/24/2018 10:32 AM       Related encounter: ED to Hosp-Admission (Current) from 10/23/2018 in [facility]      Signed      Expand All Collapse All    Show:Clear all [x] Manual[x] Template[] Copied  Added by: [x] Love, Evlyn Kanner, PA-C[x] David Oyster, MD  [] Hover for details      Physical Medicine and Rehabilitation Consult   Reason for Consult: Functional deficits due to stroke Referring Physician: Dr. Isidoro Donning   HPI: David Mosley is a 62 y.o. male with history of HTN, PSVT meningitis, tobacco use; who was admitted on 10/23/2018 with numbness and tingling of right upper extremity and right leg as well as weakness.  UDS negative CT perfusion head/neck done revealing no infarct or penumbra.  CTA head neck showed bulky soft plaque at left ICA origin and bulb with high-grade left ICA origin stenosis approaching radiographic string sign otherwise negative for large vessel occlusion.  TPA not given as out of window for treatment and he was transferred to [facility] for evaluation and treatment.  Work-up underway and CIR recommended due to functional deficits    Review of Systems  Constitutional: Negative for chills and fever.  HENT: Negative for hearing loss and tinnitus.   Eyes: Positive for blurred vision (poor vision. ).  Respiratory: Negative for cough and shortness of breath.   Cardiovascular: Negative for chest pain and palpitations.  Gastrointestinal: Negative for heartburn and nausea.  Musculoskeletal: Negative for back pain, falls, myalgias and neck pain.       RLE spasms  Skin: Negative for rash.  Neurological: Positive for tingling, sensory change and focal weakness.  Psychiatric/Behavioral: The patient is not nervous/anxious and does not have insomnia.          Past Medical History:  Diagnosis Date  . Hypertension    . Hypothyroidism   . Meningitis   . Pneumonia   . Tobacco use          Past Surgical History:  Procedure Laterality Date  . NO PAST SURGERIES           Family History  Problem Relation Age of Onset  . Alzheimer's disease Mother   . Prostate cancer Father   . Arrhythmia Sister     Social History:  Lives alone. 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 Does not drive due to visual deficits.  He reports that he has been smoking cigarettes--has cut down from 3 PPD to about 3 packs/week. He has a 9.75 pack-year smoking history. He has never used smokeless tobacco. He reports that he does not drink alcohol or use drugs.         Allergies  Allergen Reactions  . Amlodipine Nausea Only, Anxiety and Palpitations  . Penicillins Itching    Has patient had a PCN reaction causing immediate rash, facial/tongue/throat swelling, SOB or lightheadedness with hypotension: No Has patient had a PCN reaction causing severe rash involving mucus membranes or skin necrosis: No Has patient had a PCN reaction that required hospitalization: No Has patient had a PCN reaction occurring within the last 10 years: No\ If all of the above answers are "NO", then may proceed with Cephalosporin use.          Medications Prior to Admission  Medication Sig Dispense Refill  . atorvastatin (LIPITOR) 20 MG tablet Take 1 tablet (20 mg  total) by mouth daily. 90 tablet 1  . diltiazem (CARDIZEM CD) 180 MG 24 hr capsule Take 1 capsule (180 mg total) by mouth daily. 30 capsule 6  . flecainide (TAMBOCOR) 50 MG tablet Take 1 tablet (50 mg total) by mouth every 12 (twelve) hours. 60 tablet 6  . levothyroxine (SYNTHROID, LEVOTHROID) 100 MCG tablet Take 1 tablet (100 mcg total) by mouth daily. 90 tablet 0  . losartan (COZAAR) 50 MG tablet Take 1 tablet (50 mg total) by mouth daily. 90 tablet 1  . metoprolol tartrate (LOPRESSOR) 25 MG tablet Take 1 tablet (25 mg total) by mouth 2 (two) times daily. 180 tablet  1    Home: Home Living Family/patient expects to be discharged to:: Inpatient rehab  Functional History: Prior Function Level of Independence: Independent Functional Status:  Mobility: Bed Mobility Overal bed mobility: Needs Assistance Bed Mobility: Supine to Sit, Sit to Supine Supine to sit: Mod assist Sit to supine: Min assist General bed mobility comments: Mod A for trunk elevation. Limited ability to assist with RUE. Increased time required. Min A for RLE assist to return to supine.  Transfers Overall transfer level: Needs assistance Transfers: Lateral/Scoot Transfers  Lateral/Scoot Transfers: Min assist General transfer comment: Did not attempt to stand with +1 assist given RLE weakness, however, did perform lateral scoot along EOB. Required min A for assist with scooting. Heavy reliance on LUE/LLE to assist with scooting.   ADL:  Cognition: Cognition Overall Cognitive Status: Within Functional Limits for tasks assessed Orientation Level: Oriented X4 Cognition Arousal/Alertness: Awake/alert Behavior During Therapy: WFL for tasks assessed/performed Overall Cognitive Status: Within Functional Limits for tasks assessed   Blood pressure (!) 172/94, pulse 66, temperature 98.2 F (36.8 C), temperature source Oral, resp. rate 18, height 5\' 8"  (1.727 m), weight 96.2 kg, SpO2 100 %. Physical Exam  Nursing note and vitals reviewed. Constitutional: He is oriented to person, place, and time.  Neurological: He is alert and oriented to person, place, and time.  Mild right facial weakness. Speech clear and he is able to follow basic commands without difficulty. Right sided weakness with sensory deficits.            Assessment/Plan: Diagnosis: Hospital day # 2 after presenting with mild right hemiparesis and sensory loss. Presumptive CVA 1. Does the need for close, 24 hr/day medical supervision in concert with the patient's rehab needs make it unreasonable for this  patient to be served in a less intensive setting? Potentially  2. Co-Morbidities requiring supervision/potential complications: HTN, PSVT, meningitis,  3. Due to bladder management, bowel management, safety, skin/wound care, disease management, medication administration, pain management and patient education, does the patient require 24 hr/day rehab nursing? Yes/potentially 4. Does the patient require coordinated care of a physician, rehab nurse, PT (1-2 hrs/day, 5 days/week) and OT (1-2 hrs/day, 5 days/week) to address physical and functional deficits in the context of the above medical diagnosis(es)? Potentially Addressing deficits in the following areas: balance, endurance, locomotion, strength, transferring, bowel/bladder control, bathing, dressing, feeding, grooming and psychosocial support 5. Can the patient actively participate in an intensive therapy program of at least 3 hrs of therapy per day at least 5 days per week? Yes 6. The potential for patient to make measurable gains while on inpatient rehab is TBD 7. Anticipated functional outcomes upon discharge from inpatient rehab are modified independent  with PT, modified independent with OT, n/a with SLP. 8. Estimated rehab length of stay to reach the above functional goals is: ?TBD 9.  Anticipated D/C setting: Other 10. Anticipated post D/C treatments: HH therapy 11. Overall Rehab/Functional Prognosis: excellent  RECOMMENDATIONS: This patient's condition is appropriate for continued rehabilitative care in the following setting: see below Patient has agreed to participate in recommended program. N/A Note that insurance prior authorization may be required for reimbursement for recommended care.  Comment: Hospital day #2. Presumptive CVA. Observe for functional progress with therapies.  Short CIR stay vs home with home health   David Oyster, MD, [facility] Physical Medicine & Rehabilitation 10/24/2018   Jacquelynn Cree,  PA-C 10/24/2018        Revision History                        Routing History

## 2018-10-25 NOTE — Progress Notes (Signed)
IP rehab admissions - I met with patient and he would like inpatient rehab admission.  I will check with attending MD to see if patient is medically ready.  Call me for questions.  [phone]

## 2018-10-25 NOTE — H&P (Signed)
Physical Medicine and Rehabilitation Admission H&P    Chief Complaint  Patient presents with  . Functional deficits due to stroke.     HPI: David Mosley is a 62 year old male with history of HTN, PSVT, meningitis, tobacco abuse; who was admitted on 10/23/2018 with numbness and tingling right upper extremity and right lower extremity as well as weakness.  UDS negative.  CT perfusion head/neck done revealing no infarct or penumbra.  CTA head/neck showed bulky soft plaque left ICA origin and bulb with high-grade left ICA stenosis approaching radiographic string sign otherwise negative for large vessel occlusion.  TPA not given as patient now a window for treatment and was transferred to [facility] for evaluation and treatment.  Carotid Dopplers done revealing 40 to 59% left ICA stenosis.  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 Dr Erlinda Hong felt that stroke likely due to small versus large vessel disease--left ICA soft plaque.  He recommended aspirin Plavix x3 weeks followed by aspirin alone.  Patient with improvement in sensation on right side however continues to be limited by right-sided weakness, deficits and gait as well as ability to carry out ADLs.  CIR recommended for follow-up therapy   Review of Systems  Constitutional: Negative for chills and fever.  HENT: Negative for hearing loss and tinnitus.   Eyes: Positive for blurred vision (has had poor vision for years. Does not drive).  Respiratory: Negative for shortness of breath.   Cardiovascular: Negative for chest pain and palpitations.  Gastrointestinal: Positive for constipation. Negative for heartburn.  Genitourinary: Negative for dysuria and urgency.  Musculoskeletal: Positive for joint pain. Negative for back pain.       RLE spasms  Neurological: Positive for focal weakness and headaches.  Psychiatric/Behavioral: The patient is  not nervous/anxious.      Past Medical History:  Diagnosis Date  . Hypertension   . Hypothyroidism   . Meningitis   . Pneumonia   . PSVT (paroxysmal supraventricular tachycardia) ([place])   . PVD (peripheral vascular disease) ([place])   . Tobacco use     Past Surgical History:  Procedure Laterality Date  . NO PAST SURGERIES      Family History  Problem Relation Age of Onset  . Alzheimer's disease Mother   . Prostate cancer Father   . Arrhythmia Sister     Social History:  Lives alone. Works in a [employer]. He reports that he used to smoke 3 PPD till a few months ago--now down to 3 packs/week. He has a 9.75 pack-year smoking history. He has never used smokeless tobacco. He reports that he does not drink alcohol or use drugs.    Allergies  Allergen Reactions  . Amlodipine Nausea Only, Anxiety and Palpitations  . Penicillins Itching    Has patient had a PCN reaction causing immediate rash, facial/tongue/throat swelling, SOB or lightheadedness with hypotension: No Has patient had a PCN reaction causing severe rash involving mucus membranes or skin necrosis: No Has patient had a PCN reaction that required hospitalization: No Has patient had a PCN reaction occurring within the last 10 years: No\ If all of the above answers are "NO", then may proceed with Cephalosporin use.    Medications Prior to Admission  Medication Sig Dispense Refill  . atorvastatin (LIPITOR) 20 MG tablet Take 1 tablet (20 mg total) by mouth daily. 90 tablet 1  . diltiazem (CARDIZEM CD) 180 MG 24 hr  capsule Take 1 capsule (180 mg total) by mouth daily. 30 capsule 6  . flecainide (TAMBOCOR) 50 MG tablet Take 1 tablet (50 mg total) by mouth every 12 (twelve) hours. 60 tablet 6  . levothyroxine (SYNTHROID, LEVOTHROID) 100 MCG tablet Take 1 tablet (100 mcg total) by mouth daily. 90 tablet 0  . losartan (COZAAR) 50 MG tablet Take 1 tablet (50 mg total) by mouth daily. 90 tablet 1  . metoprolol tartrate  (LOPRESSOR) 25 MG tablet Take 1 tablet (25 mg total) by mouth 2 (two) times daily. 180 tablet 1    Drug Regimen Review  Drug regimen was reviewed and remains appropriate with no significant issues identified  Home: Home Living Family/patient expects to be discharged to:: Private residence Living Arrangements: Alone Available Help at Discharge: Friend(s), Available PRN/intermittently Bathroom Shower/Tub: Tub/shower unit, Architectural technologist: Standard  Lives With: Alone   Functional History: Prior Function Level of Independence: Independent  Functional Status:  Mobility: Bed Mobility Overal bed mobility: Needs Assistance Bed Mobility: Supine to Sit, Sit to Supine Supine to sit: Mod assist Sit to supine: Min assist General bed mobility comments: in the chair on arrival Transfers Overall transfer level: Needs assistance Equipment used: Rolling walker (2 wheeled) Transfers: Sit to/from Stand Sit to Stand: Mod assist, +2 physical assistance, +2 safety/equipment  Lateral/Scoot Transfers: Min assist General transfer comment: cues for hand placement. Pt required assist to power up from chair. VC for safety with AD. Ambulation/Gait Ambulation/Gait assistance: Max assist, +2 physical assistance Gait Distance (Feet): 20 Feet(with 2 standing rest breaks) Assistive device: Rolling walker (2 wheeled) Gait Pattern/deviations: Step-to pattern, Decreased step length - right, Decreased step length - left, Decreased stance time - right, Decreased weight shift to left, Scissoring, Leaning posteriorly, Decreased dorsiflexion - right, Narrow base of support, Trunk flexed, Drifts right/left General Gait Details: Pt required VC/TC for proper gait pattern with RW and needed assist progressing RW. Cueing for advancing RLE and keeping RUE on RW. When making turns pt required assist progressing RW and for proper foot placement.   Max A + 2 required to prevent LOB. Gait velocity interpretation: <1.31  ft/sec, indicative of household ambulator    ADL: ADL Overall ADL's : Needs assistance/impaired Eating/Feeding: Supervision/ safety, Set up Eating/Feeding Details (indicate cue type and reason): supported sitting in recliner, needs A for opening packages and lids that are a two handed task Grooming: Minimal assistance Grooming Details (indicate cue type and reason): supported sitting in recliner Upper Body Bathing: Minimal assistance Upper Body Bathing Details (indicate cue type and reason): supported sitting in recliner Lower Body Bathing: Moderate assistance Lower Body Bathing Details (indicate cue type and reason): Mod A +2 sit<>stand from recliner (VCs for hand placement) Upper Body Dressing : Moderate assistance Upper Body Dressing Details (indicate cue type and reason): supported sitting in recliner Lower Body Dressing: Maximal assistance Lower Body Dressing Details (indicate cue type and reason): Mod A +2 sit<>stand from recliner (VCs for hand placement) Toilet Transfer: Maximal assistance, +2 for physical assistance, Ambulation Toilet Transfer Details (indicate cue type and reason): Bil HHA, VCs for weight shifting and moving RLE with tall stance before moving LLE Toileting- Clothing Manipulation and Hygiene: Total assistance Toileting - Clothing Manipulation Details (indicate cue type and reason): Mod A +2 sit<>stand from recliner (VCs for hand placement)  Cognition: Cognition Overall Cognitive Status: Within Functional Limits for tasks assessed Arousal/Alertness: Awake/alert Orientation Level: Oriented X4 Attention: Selective Selective Attention: Appears intact Memory: Appears intact Awareness: Appears intact Cognition  Arousal/Alertness: Awake/alert Behavior During Therapy: WFL for tasks assessed/performed Overall Cognitive Status: Within Functional Limits for tasks assessed General Comments: At end of session pt stated that he thinks his R knee is broken due to hitting  it yesterday; RN was notified.   Blood pressure (!) 151/89, pulse (!) 59, temperature 97.7 F (36.5 C), temperature source Oral, resp. rate 16, height '5\' 8"'  (1.727 m), weight 96.2 kg, SpO2 94 %. Physical Exam  Nursing note and vitals reviewed. Constitutional: He is oriented to person, place, and time. He appears well-developed and well-nourished.  HENT:  Head: Normocephalic and atraumatic.  Eyes: Pupils are equal, round, and reactive to light. EOM are normal.  Neck: Normal range of motion. No tracheal deviation present. No thyromegaly present.  Cardiovascular: Normal rate. Exam reveals no friction rub.  No murmur heard. Respiratory: Effort normal. No respiratory distress. He has no wheezes. He has no rales.  GI: He exhibits no distension. There is no tenderness. There is no rebound.  Musculoskeletal: He exhibits no edema or tenderness.  Neurological: He is alert and oriented to person, place, and time.  Right central 7 with mild dysarthria. Normal language. Reasonable insight and awareness. RUE 4-/5 prox to distal. RLE 4- to 4/5 prox to distal. LUE and LLE 5/5. No focal sensory changes.   Skin: Skin is warm and dry.  Psychiatric: He has a normal mood and affect. His behavior is normal. Judgment and thought content normal.    Results for orders placed or performed during the hospital encounter of 10/23/18 (from the past 48 hour(s))  Hemoglobin A1c     Status: Abnormal   Collection Time: 10/24/18  3:30 AM  Result Value Ref Range   Hgb A1c MFr Bld 5.8 (H) 4.8 - 5.6 %    Comment: (NOTE) Pre diabetes:          5.7%-6.4% Diabetes:              >6.4% Glycemic control for   <7.0% adults with diabetes    Mean Plasma Glucose 119.76 mg/dL    Comment: Performed at [facility], [place] [address] [place], [place] [address]  Lipid panel     Status: Abnormal   Collection Time: 10/24/18  3:30 AM  Result Value Ref Range   Cholesterol 119 0 - 200 mg/dL   Triglycerides 118 <150 mg/dL   HDL  32 (L) >40 mg/dL   Total CHOL/HDL Ratio 3.7 RATIO   VLDL 24 0 - 40 mg/dL   LDL Cholesterol 63 0 - 99 mg/dL    Comment:        Total Cholesterol/HDL:CHD Risk Coronary Heart Disease Risk Table                     Men   Women  1/2 Average Risk   3.4   3.3  Average Risk       5.0   4.4  2 X Average Risk   9.6   7.1  3 X Average Risk  23.4   11.0        Use the calculated Patient Ratio above and the CHD Risk Table to determine the patient's CHD Risk.        ATP III CLASSIFICATION (LDL):  <100     mg/dL   Optimal  100-129  mg/dL   Near or Above                    Optimal  130-159  mg/dL   Borderline  160-189  mg/dL   High  >190     mg/dL   Very High Performed at [facility] [address], [place] [address]   CBC     Status: None   Collection Time: 10/24/18  3:30 AM  Result Value Ref Range   WBC 7.4 4.0 - 10.5 K/uL   RBC 4.54 4.22 - 5.81 MIL/uL   Hemoglobin 13.3 13.0 - 17.0 g/dL   HCT 41.8 39.0 - 52.0 %   MCV 92.1 80.0 - 100.0 fL   MCH 29.3 26.0 - 34.0 pg   MCHC 31.8 30.0 - 36.0 g/dL   RDW 13.2 11.5 - 15.5 %   Platelets 177 150 - 400 K/uL   nRBC 0.0 0.0 - 0.2 %    Comment: Performed at [facility], [place] [address] [place], [place] [address]  Basic metabolic panel     Status: Abnormal   Collection Time: 10/24/18  3:30 AM  Result Value Ref Range   Sodium 137 135 - 145 mmol/L   Potassium 3.7 3.5 - 5.1 mmol/L   Chloride 109 98 - 111 mmol/L   CO2 22 22 - 32 mmol/L   Glucose, Bld 109 (H) 70 - 99 mg/dL   BUN 11 8 - 23 mg/dL   Creatinine, Ser 1.13 0.61 - 1.24 mg/dL   Calcium 8.5 (L) 8.9 - 10.3 mg/dL   GFR calc non Af Amer >60 >60 mL/min   GFR calc Af Amer >60 >60 mL/min    Comment: (NOTE) The eGFR has been calculated using the CKD EPI equation. This calculation has not been validated in all clinical situations. eGFR's persistently <60 mL/min signify possible Chronic Kidney Disease.    Anion gap 6 5 - 15    Comment: Performed at [facility] [address], [place], [place] [address]  Basic metabolic panel     Status: Abnormal   Collection Time: 10/25/18  4:10 AM  Result Value Ref Range   Sodium 136 135 - 145 mmol/L   Potassium 3.7 3.5 - 5.1 mmol/L   Chloride 107 98 - 111 mmol/L   CO2 23 22 - 32 mmol/L   Glucose, Bld 93 70 - 99 mg/dL   BUN 17 8 - 23 mg/dL   Creatinine, Ser 1.21 0.61 - 1.24 mg/dL   Calcium 8.7 (L) 8.9 - 10.3 mg/dL   GFR calc non Af Amer >60 >60 mL/min   GFR calc Af Amer >60 >60 mL/min    Comment: (NOTE) The eGFR has been calculated using the CKD EPI equation. This calculation has not been validated in all clinical situations. eGFR's persistently <60 mL/min signify possible Chronic Kidney Disease.    Anion gap 6 5 - 15    Comment: Performed at [facility] [address], [place], [place] [address]   Mr [name] Contrast  Result Date: 10/24/2018 CLINICAL DATA:  RIGHT-sided weakness. History of LEFT ICA stenosis, meningitis, hypertension. EXAM: MRI HEAD WITHOUT CONTRAST TECHNIQUE: Multiplanar, multiecho pulse sequences of the brain and surrounding structures were obtained without intravenous contrast. COMPARISON:  CT HEAD October 23, 2018 FINDINGS: Moderately motion degraded examination. INTRACRANIAL CONTENTS: 18 x 6 mm ovoid reduced diffusion LEFT internal capsule to basal ganglia with low ADC values. No susceptibility artifact to suggest hemorrhage. Scattered subcentimeter supratentorial white matter FLAIR T2 hyperintensities. Moderate supratentorial parenchymal brain volume loss. No hydrocephalus. No suspicious parenchymal signal, masses, mass effect. No abnormal extra-axial  fluid collections. No extra-axial masses. VASCULAR: Normal major intracranial vascular flow voids present at skull base. SKULL AND UPPER CERVICAL SPINE: No abnormal sellar expansion. No suspicious calvarial bone marrow signal. Craniocervical junction maintained. SINUSES/ORBITS: The mastoid air-cells and included paranasal sinuses are  well-aerated.The included ocular globes and orbital contents are non-suspicious. OTHER: Patient is edentulous. IMPRESSION: 1. Moderately motion degraded examination. Acute nonhemorrhagic LEFT internal capsule/basal ganglia infarct. 2. Moderate parenchymal brain volume loss, advanced for age. 3. Mild chronic small vessel ischemic changes. 4. These results will be called to the ordering clinician or representative by the professional radiologist assistant, and communication documented in [facility] Dashboard. Electronically Signed   By: Elon Alas M.D.   On: 10/24/2018 22:32   Dg Knee Right Port  Result Date: 10/25/2018 CLINICAL DATA:  Knee injury EXAM: PORTABLE RIGHT KNEE - 1-2 VIEW COMPARISON:  None. FINDINGS: No fracture or malalignment. Small knee effusion. Mild patellofemoral and lateral degenerative changes. IMPRESSION: No acute osseous abnormality. Small moderate knee effusion with arthritis. Electronically Signed   By: Donavan Foil M.D.   On: 10/25/2018 00:07   Vas US Carotid  Result Date: 10/24/2018 Carotid Arterial Duplex Study Indications:  CVA and Weakness. Risk Factors: Hypertension, hyperlipidemia, current smoker, PAD. Performing Technologist: Sharion Dove RVS  Examination Guidelines: A complete evaluation includes B-mode imaging, spectral Doppler, color Doppler, and power Doppler as needed of all accessible portions of each vessel. Bilateral testing is considered an integral part of a complete examination. Limited examinations for reoccurring indications may be performed as noted.  Right Carotid Findings: +----------+--------+--------+--------+-----------+------------------+           PSV cm/sEDV cm/sStenosisDescribe   Comments           +----------+--------+--------+--------+-----------+------------------+ CCA Prox  91      24                         intimal thickening +----------+--------+--------+--------+-----------+------------------+ CCA Distal55      12                          intimal thickening +----------+--------+--------+--------+-----------+------------------+ ICA Prox  41      13              homogeneous                   +----------+--------+--------+--------+-----------+------------------+ ICA Distal123     32                                            +----------+--------+--------+--------+-----------+------------------+ ECA       131     24                                            +----------+--------+--------+--------+-----------+------------------+ +----------+--------+-------+--------+-------------------+           PSV cm/sEDV cmsDescribeArm Pressure (mmHG) +----------+--------+-------+--------+-------------------+ [id number]                                         +----------+--------+-------+--------+-------------------+ +---------+--------+--+--------+--+ VertebralPSV cm/s74EDV cm/s20 +---------+--------+--+--------+--+  Left Carotid Findings: +----------+--------+--------+--------+------------+------------------+           PSV cm/sEDV  cm/sStenosisDescribe    Comments           +----------+--------+--------+--------+------------+------------------+ CCA Prox  102     18                          intimal thickening +----------+--------+--------+--------+------------+------------------+ CCA Distal60      13                          intimal thickening +----------+--------+--------+--------+------------+------------------+ ICA Prox  140     49      40-59%  heterogenousShadowing          +----------+--------+--------+--------+------------+------------------+ ICA Mid   120     39                                             +----------+--------+--------+--------+------------+------------------+ ICA Distal82      24                                             +----------+--------+--------+--------+------------+------------------+ ECA       100     13                                              +----------+--------+--------+--------+------------+------------------+ +----------+--------+--------+--------+-------------------+ SubclavianPSV cm/sEDV cm/sDescribeArm Pressure (mmHG) +----------+--------+--------+--------+-------------------+           58                                          +----------+--------+--------+--------+-------------------+ +---------+--------+--+--------+--+ VertebralPSV cm/s44EDV cm/s12 +---------+--------+--+--------+--+  Summary: Right Carotid: Velocities in the right ICA are consistent with a 1-39% stenosis. Left Carotid: Velocities in the left ICA are consistent with a 40-59% stenosis. Vertebrals:  Bilateral vertebral arteries demonstrate antegrade flow. Subclavians: Normal flow hemodynamics were seen in bilateral subclavian              arteries. *See table(s) above for measurements and observations.  Electronically signed by Antony Contras MD on 10/24/2018 at 3:52:16 PM.    Final        Medical Problem List and Plan: 1.  Functional deficits secondary to Left internal capsule/basal ganglia stroke.   -admit to inpatient rehab 2.  DVT Prophylaxis/Anticoagulation: Pharmaceutical: Lovenox 3. Pain Management: Tylenol and ice as needed for right knee pain.  Add baclofen as needed for spasms. 4. Mood: LCSW to follow for evaluation and support. 5. Neuropsych: This patient is capable of making decisions on  own behalf. 6. Skin/Wound Care: Routine pressure relief measures.  Maintain adequate nutrition and hydration status. 7. Fluids/Electrolytes/Nutrition: Monitor I's and O's.  Will order double portions as patient reporting hunger.  Check CMET in a.m. 8.  Hypertension: Monitor blood pressures twice daily.  Continue Cardizem and Metoprolol. 9. History of PSVT: Monitor heart rate twice daily basis.  Continue Cardizem, Metoprolol and Tambocor 10.  Tobacco abuse: Has been weaning himself in the past few months.  Will continue nicotine  patch. 11. Dyslipidemia: on Lipitor.  12. Constipation: Start Miralax.  Post Admission  Physician Evaluation: 1. Functional deficits secondary  to left internal capsule/basal ganlgia infarct. 2. Patient is admitted to receive collaborative, interdisciplinary care between the physiatrist, rehab nursing staff, and therapy team. 3. Patient's level of medical complexity and substantial therapy needs in context of that medical necessity cannot be provided at a lesser intensity of care such as a SNF. 4. Patient has experienced substantial functional loss from his/her baseline which was documented above under the "Functional History" and "Functional Status" headings.  Judging by the patient's diagnosis, physical exam, and functional history, the patient has potential for functional progress which will result in measurable gains while on inpatient rehab.  These gains will be of substantial and practical use upon discharge  in facilitating mobility and self-care at the household level. 5. Physiatrist will provide 24 hour management of medical needs as well as oversight of the therapy plan/treatment and provide guidance as appropriate regarding the interaction of the two. 6. The Preadmission Screening has been reviewed and patient status is unchanged unless otherwise stated above. 7. 24 hour rehab nursing will assist with bladder management, bowel management, safety, skin/wound care, disease management, medication administration, pain management and patient education  and help integrate therapy concepts, techniques,education, etc. 8. PT will assess and treat for/with: Lower extremity strength, range of motion, stamina, balance, functional mobility, safety, adaptive techniques and equipment, NMR, community reentry.   Goals are: mod I. 9. OT will assess and treat for/with: ADL's, functional mobility, safety, upper extremity strength, adaptive techniques and equipment, NMR, community reentry.   Goals are: mod I.  Therapy may proceed with showering this patient. 10. SLP will assess and treat for/with: n/a.  Goals are: n/a. 11. Case Management and Social Worker will assess and treat for psychological issues and discharge planning. 12. Team conference will be held weekly to assess progress toward goals and to determine barriers to discharge. 13. Patient will receive at least 3 hours of therapy per day at least 5 days per week. 14. ELOS: 10-14 days       15. Prognosis:  excellent   I have personally performed a face to face diagnostic evaluation of this patient and formulated the key components of the plan.  Additionally, I have personally reviewed laboratory data, imaging studies, as well as relevant notes and concur with the physician assistant's documentation above.  Meredith Staggers, MD, [facility] Drown    Bary Leriche, PA-C 10/25/2018

## 2018-10-25 NOTE — PMR Pre-admission (Signed)
PMR Admission Coordinator Pre-Admission Assessment  Patient: David Mosley is an 62 y.o., male MRN: [id number] DOB: 1956/05/24 Height: 5\' 8"  (172.7 cm) Weight: 96.2 kg              Insurance Information Self pay - no insurance  Medicaid Application Date:        Case Manager:   Disability Application Date:        Case Worker:    Emergency Conservator, museum/gallery Information    Name Relation Home Work [place], [place] F Sister [phone]       Current Medical History  Patient Admitting Diagnosis: L CVA  History of Present Illness: A 62 y.o. male with history of HTN, PSVT meningitis, tobacco use; who was admitted on 10/23/2018 with numbness and tingling of right upper extremity and right leg as well as weakness.  UDS negative CT perfusion head/neck done revealing no infarct or penumbra.  CTA head neck showed bulky soft plaque at left ICA origin and bulb with high-grade left ICA origin stenosis approaching radiographic string sign otherwise negative for large vessel occlusion.  TPA not given as out of window for treatment and he was transferred to [facility] for evaluation and treatment.  Work-up underway and CIR recommended due to functional deficits.  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  SLP saw patient and has signed off.  Complete NIHSS TOTAL: 4  Past Medical History  Past Medical History:  Diagnosis Date  . Hypertension   . Hypothyroidism   . Meningitis   . Pneumonia   . PSVT (paroxysmal supraventricular tachycardia) (HCC)   . PVD (peripheral vascular disease) (HCC)   . Tobacco use     Family History  family history includes Alzheimer's disease in his mother; Arrhythmia in his sister; Prostate cancer in his father.  Prior Rehab/Hospitalizations:  Has the patient had major surgery during 100 days prior to admission? No  Current Medications   Current Facility-Administered Medications:  .  acetaminophen (TYLENOL) tablet 650 mg, 650 mg, Oral, Q4H PRN  **OR** acetaminophen (TYLENOL) solution 650 mg, 650 mg, Per Tube, Q4H PRN **OR** acetaminophen (TYLENOL) suppository 650 mg, 650 mg, Rectal, Q4H PRN, Sherryll Burger, Pratik D, DO .  [DISCONTINUED] aspirin suppository 300 mg, 300 mg, Rectal, Daily **OR** aspirin tablet 325 mg, 325 mg, Oral, Daily, Sherryll Burger, Pratik D, DO, 325 mg at 10/25/18 1116 .  atorvastatin (LIPITOR) tablet 20 mg, 20 mg, Oral, q1800, Marvel Plan, MD .  diltiazem (CARDIZEM CD) 24 hr capsule 180 mg, 180 mg, Oral, Daily, Marvel Plan, MD, 180 mg at 10/25/18 1116 .  flecainide (TAMBOCOR) tablet 50 mg, 50 mg, Oral, Q12H, Sherryll Burger, Pratik D, DO, 50 mg at 10/25/18 1117 .  heparin injection 5,000 Units, 5,000 Units, Subcutaneous, Q8H, Shah, Pratik D, DO, 5,000 Units at 10/25/18 0656 .  hydrALAZINE (APRESOLINE) injection 10 mg, 10 mg, Intravenous, Q4H PRN, Sherryll Burger, Pratik D, DO .  levothyroxine (SYNTHROID, LEVOTHROID) tablet 100 mcg, 100 mcg, Oral, QAC breakfast, Sherryll Burger, Pratik D, DO, 100 mcg at 10/25/18 1116 .  metoprolol tartrate (LOPRESSOR) tablet 12.5 mg, 12.5 mg, Oral, BID, Marvel Plan, MD, 12.5 mg at 10/25/18 1116 .  nicotine (NICODERM CQ - dosed in mg/24 hours) patch 21 mg, 21 mg, Transdermal, Daily, Rai, Ripudeep K, MD  Patients Current Diet:  Diet Order            Diet Heart Room service appropriate? Yes; Fluid consistency: Thin  Diet effective now  Precautions / Restrictions Precautions Precautions: Fall Precaution Comments: Permissive HTN 220/110 per neurology consult note Restrictions Weight Bearing Restrictions: No   Has the patient had 2 or more falls or a fall with injury in the past year?  No falls  Prior Activity Level Community (5-7x/wk): Went out daily.  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  Not driving due to vision impairment.  Home Assistive Devices / Equipment Home Assistive Devices/Equipment: Eyeglasses  Prior Device Use: Indicate devices/aids used by the patient prior to current illness,  exacerbation or injury? None  Prior Functional Level Prior Function Level of Independence: Independent  Self Care: Did the patient need help bathing, dressing, using the toilet or eating?  Independent  Indoor Mobility: Did the patient need assistance with walking from room to room (with or without device)? Independent  Stairs: Did the patient need assistance with internal or external stairs (with or without device)? Independent  Functional Cognition: Did the patient need help planning regular tasks such as shopping or remembering to take medications? Independent  Current Functional Level Cognition  Arousal/Alertness: Awake/alert Overall Cognitive Status: Within Functional Limits for tasks assessed Orientation Level: Oriented X4 General Comments: At end of session pt stated that he thinks his R knee is broken due to hitting it yesterday; RN was notified. Attention: Selective Selective Attention: Appears intact Memory: Appears intact Awareness: Appears intact    Extremity Assessment (includes Sensation/Coordination)  Upper Extremity Assessment: RUE deficits/detail RUE Deficits / Details: While seated in recliner pt able with increased time and effort pt able to extend elbow and reach for my hand, pt also with increased effort and time able to raise arm over head with elbow bent, can extend and close hand and even "give the bird" (he wanted to show [place] he could do this). Synergistic patterns RUE Coordination: decreased fine motor, decreased gross motor  Lower Extremity Assessment: RLE deficits/detail RLE Deficits / Details: 2/5 on ankle DF; 3-/5 on knee extensors and 2+/5 on hip flexors.  RLE Coordination: decreased gross motor    ADLs  Overall ADL's : Needs assistance/impaired Eating/Feeding: Supervision/ safety, Set up Eating/Feeding Details (indicate cue type and reason): supported sitting in recliner, needs A for opening packages and lids that are a two handed task Grooming:  Minimal assistance Grooming Details (indicate cue type and reason): supported sitting in recliner Upper Body Bathing: Minimal assistance Upper Body Bathing Details (indicate cue type and reason): supported sitting in recliner Lower Body Bathing: Moderate assistance Lower Body Bathing Details (indicate cue type and reason): Mod A +2 sit<>stand from recliner (VCs for hand placement) Upper Body Dressing : Moderate assistance Upper Body Dressing Details (indicate cue type and reason): supported sitting in recliner Lower Body Dressing: Maximal assistance Lower Body Dressing Details (indicate cue type and reason): Mod A +2 sit<>stand from recliner (VCs for hand placement) Toilet Transfer: Maximal assistance, +2 for physical assistance, Ambulation Toilet Transfer Details (indicate cue type and reason): Bil HHA, VCs for weight shifting and moving RLE with tall stance before moving LLE Toileting- Clothing Manipulation and Hygiene: Total assistance Toileting - Clothing Manipulation Details (indicate cue type and reason): Mod A +2 sit<>stand from recliner (VCs for hand placement)    Mobility  Overal bed mobility: Needs Assistance Bed Mobility: Supine to Sit, Sit to Supine Supine to sit: Mod assist Sit to supine: Min assist General bed mobility comments: in the chair on arrival    Transfers  Overall transfer level: Needs assistance Equipment used: Rolling walker (2 wheeled)  Transfers: Sit to/from Stand Sit to Stand: Mod assist, +2 physical assistance, +2 safety/equipment  Lateral/Scoot Transfers: Min assist General transfer comment: cues for hand placement. Pt required assist to power up from chair. VC for safety with AD.    Ambulation / Gait / Stairs / Wheelchair Mobility  Ambulation/Gait Ambulation/Gait assistance: Max assist, +2 physical assistance Gait Distance (Feet): 20 Feet(with 2 standing rest breaks) Assistive device: Rolling walker (2 wheeled) Gait Pattern/deviations: Step-to  pattern, Decreased step length - right, Decreased step length - left, Decreased stance time - right, Decreased weight shift to left, Scissoring, Leaning posteriorly, Decreased dorsiflexion - right, Narrow base of support, Trunk flexed, Drifts right/left General Gait Details: Pt required VC/TC for proper gait pattern with RW and needed assist progressing RW. Cueing for advancing RLE and keeping RUE on RW. When making turns pt required assist progressing RW and for proper foot placement.   Max A + 2 required to prevent LOB. Gait velocity interpretation: <1.31 ft/sec, indicative of household ambulator    Posture / Balance Dynamic Sitting Balance Sitting balance - Comments: R lateral lean with dynamic activities. Reliant on LUE support.  Balance Overall balance assessment: Needs assistance Sitting-balance support: Single extremity supported, Feet supported Sitting balance-Leahy Scale: Poor Sitting balance - Comments: R lateral lean with dynamic activities. Reliant on LUE support.  Standing balance support: Bilateral upper extremity supported Standing balance-Leahy Scale: Poor Standing balance comment: reliant on the RW and external support    Special needs/care consideration BiPAP/CPAP No CPM No Continuous Drip IV No Dialysis No        Life Vest No Oxygen No Special Bed No Trach Size no Wound Vac (area) no    Skin No                              Bowel mgmt: Last BM 10/23/18 Bladder mgmt: Voiding in urinal Diabetic mgmt No    Previous Home Environment Living Arrangements: Alone  Lives With: Alone Available Help at Discharge: Friend(s), Available PRN/intermittently Bathroom Shower/Tub: Tub/shower unit, Engineer, building services: Standard Home Care Services: No  Discharge Living Setting Plans for Discharge Living Setting: Alone, House(Lives alone.) Type of Home at Discharge: House Discharge Home Layout: One level Discharge Home Access: Stairs to enter Secretary/administrator of Steps:  2 Discharge Bathroom Shower/Tub: Walk-in shower, Curtain Discharge Bathroom Toilet: Handicapped height Discharge Bathroom Accessibility: Yes How Accessible: Accessible via walker Does the patient have any problems obtaining your medications?: No  Social/Family/Support Systems Patient Roles: Parent, Other (Comment)(Has an ex-wife, sister, 2 sons and 1 daughter) Contact Information: Orpah Greek - sister - [phone] Anticipated Caregiver: Self Ability/Limitations of Caregiver: Patient does not want to reach out to others.  Encouraged him to ask for help at discharge from family/friends Caregiver Availability: Intermittent(Patient will have to ask for help.  He lives alone.) Discharge Plan Discussed with Primary Caregiver: Yes(discussed plan with patient.) Is Caregiver In Agreement with Plan?: Yes(Patient is in agreement with plan.) Does Caregiver/Family have Issues with Lodging/Transportation while Pt is in Rehab?: No  Goals/Additional Needs Patient/Family Goal for Rehab: PT/OT mod I goals Expected length of stay: 10-14 days Cultural Considerations: [employer] Dietary Needs: Heart diet, thin liquids Equipment Needs: TBD Pt/Family Agrees to Admission and willing to participate: Yes Program Orientation Provided & Reviewed with Pt/Caregiver Including Roles  & Responsibilities: Yes  Decrease burden of Care through IP rehab admission: N/A  Possible need for SNF placement upon discharge: Not planned  Patient Condition: This patient's condition remains as documented in the consult dated 10/24/18, in which the Rehabilitation Physician determined and documented that the patient's condition is appropriate for intensive rehabilitative care in an inpatient rehabilitation facility. Patient currently requiring mod assist for transfers and max assist +2 to ambulate 18 feet RW.   Will admit to inpatient rehab today.  Preadmission Screen Completed By:  [name], 10/25/2018 12:38  PM ______________________________________________________________________   Discussed status with Dr. Riley Kill on 10/25/18 at 1238 and received telephone approval for admission today.  Admission Coordinator:  [name], time 1238/Date 10/25/18

## 2018-10-25 NOTE — Progress Notes (Addendum)
STROKE TEAM PROGRESS NOTE   SUBJECTIVE (INTERVAL HISTORY) Up in the chair, hoping for rehab today. Feels his right side has improved some.    OBJECTIVE Vitals:   10/24/18 1959 10/25/18 0004 10/25/18 0428 10/25/18 0757  BP: (!) 164/83 (!) 167/82 (!) 170/81 (!) 165/88  Pulse: 63 61 (!) 57 61  Resp: 18 18 18 20   Temp: 97.6 F (36.4 C) 98.1 F (36.7 C) 98.4 F (36.9 C) 97.8 F (36.6 C)  TempSrc: Oral Oral Oral Oral  SpO2: 98% 92% 95% 96%  Weight:      Height:        CBC:  Recent Labs  Lab 10/23/18 0840 10/23/18 0845 10/24/18 0330  WBC 9.0  --  7.4  NEUTROABS 5.5  --   --   HGB 13.7 13.9 13.3  HCT 42.0 41.0 41.8  MCV 91.9  --  92.1  PLT 179  --  177    Basic Metabolic Panel:  Recent Labs  Lab 10/24/18 0330 10/25/18 0410  NA 137 136  K 3.7 3.7  CL 109 107  CO2 22 23  GLUCOSE 109* 93  BUN 11 17  CREATININE 1.13 1.21  CALCIUM 8.5* 8.7*    Lipid Panel:     Component Value Date/Time   CHOL 119 10/24/2018 0330   TRIG 118 10/24/2018 0330   HDL 32 (L) 10/24/2018 0330   CHOLHDL 3.7 10/24/2018 0330   VLDL 24 10/24/2018 0330   LDLCALC 63 10/24/2018 0330   HgbA1c:  Lab Results  Component Value Date   HGBA1C 5.8 (H) 10/24/2018   Urine Drug Screen:     Component Value Date/Time   LABOPIA NONE DETECTED 10/23/2018 0841   COCAINSCRNUR NONE DETECTED 10/23/2018 0841   LABBENZ NONE DETECTED 10/23/2018 0841   AMPHETMU NONE DETECTED 10/23/2018 0841   THCU NONE DETECTED 10/23/2018 0841   LABBARB NONE DETECTED 10/23/2018 0841    Alcohol Level     Component Value Date/Time   ETH <10 10/23/2018 0840    IMAGING  Ct Angio Head W/cm &/or Wo Cm Ct Angio Neck W And/or Wo Contrast 10/23/2018 IMPRESSION:  1. Negative for large vessel occlusion. No left MCA branch occlusion or stenosis is evident.  2. Suspected bulky soft plaque at the Left ICA origin and bulb with High-Grade Left ICA origin stenosis approaching a RADIOGRAPHIC STRING SIGN. However, substantial venous  contrast reflux in the left neck (related to anatomic left innominate vein stenosis) with streak artifact does limit detail of the left carotid. Therefore corroboration of the high-grade stenosis with Carotid Doppler US may be valuable.  3. No other hemodynamically significant stenosis in the head or neck. Generalized arterial tortuosity.  4. Stable CT appearance of the brain since 0846 hours today.   Ct Cerebral Perfusion W Contrast 10/23/2018 IMPRESSION:  Negative CT perfusion - no infarct or penumbra.   Ct Head Code Stroke Wo Contrast 10/23/2018 IMPRESSION:  1. No hemorrhage or visible infarct.ASPECTS is 10.  2. Equivocal hyperdense focus in the left sylvian fissure, cannot exclude M2/3 branch thrombosis.   Mr [name] Contrast 10/24/2018 1. Moderately motion degraded examination. Acute nonhemorrhagic LEFT internal capsule/basal ganglia infarct. 2. Moderate parenchymal brain volume loss, advanced for age. 3. Mild chronic small vessel ischemic changes.   Dg Knee Right Port 10/25/2018 No acute osseous abnormality. Small moderate knee effusion with arthritis.   Transthoracic Echocardiogram  - Left ventricle: The cavity size was normal. Systolic function was normal. The estimated ejection fraction was in the range  of 60% to 65%. Wall motion was normal; there were no regional wall motion abnormalities. Left ventricular diastolic function parameters were normal. - Aortic valve: There was no significant regurgitation. - Mitral valve: There was trivial regurgitation. - Right ventricle: Systolic function was low normal. - Atrial septum: No defect or patent foramen ovale was identified. - Tricuspid valve: There was trivial regurgitation. Impressions: - No cardiac source of emboli was indentified.  Bilateral Carotid Dopplers 1-39% right ICA stenosis.  40-59% left ICA stenosis.  Vertebral artery flow is antegrade.   PHYSICAL EXAM General - Well nourished, well developed, in no apparent  distress.  Cardiovascular - Regular rate and rhythm.  Mental Status -  Level of arousal and orientation to time, place, and person were intact. Language including expression, naming, repetition, comprehension was assessed and found intact. Attention span and concentration were normal. Fund of Knowledge was assessed and was intact.  Cranial Nerves II - XII - II - Visual field intact OU. III, IV, VI - Extraocular movements intact. V - Facial sensation intact bilaterally. VII - mild right nasolabial fold flattening.  VIII - Hearing & vestibular intact bilaterally. X - Palate elevates symmetrically. XI - Chin turning & shoulder shrug intact bilaterally. XII - Tongue protrusion intact.  Motor Strength - The patient's strength was normal in LUE and LLE, however right upper extremity proximal 3+/5 and distal 3/5, right lower extremity proximal 3+/5 and distal 3-/5 and pronator drift was present on the right.  Bulk was normal and fasciculations were absent.   Motor Tone - Muscle tone was assessed at the neck and appendages and was normal.  Reflexes - The patient's reflexes were symmetrical in all extremities and he had no pathological reflexes.  Sensory - Light touch, temperature/pinprick were assessed and were symmetrical.    Coordination - The patient had normal movements in the hands with no ataxia or dysmetria.  Tremor was absent.  Gait and Station - deferred.   ASSESSMENT/PLAN David Mosley is a 62 y.o. male with history of tobacco abuse, hypothyroidism ,PVD, PSVT, and hypertension presenting with right sided weakness and numbness. He did not receive IV t-PA due to late presentation.  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   MRI head - L internal capsule/BG infarct Small vessel disease. Atrophy.   CTA H&N - Suspected bulky soft plaque at the  Left ICA origin and bulb with High-Grade Left ICA origin stenosis  Carotid Doppler left ICA 40 to 59% stenosis  2D Echo  - EF 60 - 65%. No cardiac source of emboli identified.   LDL - 63  HgbA1c - 5.8  UDS negative  VTE prophylaxis - Brewer Heparin  Diet  - Heart healthy with thin liquids.  No antithrombotic prior to admission, now on aspirin 325 mg daily. Given mild stroke, recommend aspirin 81 mg and plavix 75 mg daily x 3 weeks, then aspirin alone. Orders have been adjusted.   Therapy recommendations:  CIR  Disposition:  Pending NOTHING FURTHER TO ADD FROM THE STROKE STANDPOINT Patient has a 10-15% risk of having another stroke over the next year, the highest risk is within 2 weeks of the most recent stroke/TIA (risk of having a stroke following a stroke or TIA is the same). Ongoing risk factor control by Primary Care Physician Stroke Service will sign off. Please call should any needs arise. Follow-up Stroke Clinic at  [facility] in 4 weeks, order placed.   Left ICA soft plaque  CTA showed left ICA bulky soft plaque  Carotid Doppler showed left ICA 40 to 59% stenosis  MRI pending  Left brain stroke could be due to left ICA nonstenotic soft plaque  Outpatient follow-up with vascular surgery  Hypertension  Stable . Permissive hypertension (OK if < 220/120) but gradually normalize in 5-7 days . Long-term BP goal normotensive  Hyperlipidemia  Lipid lowering medication PTA:  Lipitor 20 mg daily  LDL 63, goal < 70  Current lipid lowering medication: Lipitor 20 mg daily  Continue statin at discharge  Wide QRS V. Tach  Following with cardiology Dr. Ladona Ridgel  On flecainide and Cardizem  Not much heart palpitation since taking the medications  Tobacco abuse  Current smoker  3 pack/day in the past and for the last 2 months 3 pack/week  Smoking cessation counseling provided  Pt is willing to quit  Other Stroke Risk Factors  Advanced  age  Obesity, Body mass index is 32.23 kg/m., recommend weight loss, diet and exercise as appropriate   Other Active Problems  Elevated creatinine - improved  Hospital day # 2  Annie Main, MSN, APRN, ANVP-BC, AGPCNP-BC Advanced Practice Stroke Nurse [facility] See Amion for Schedule & Pager information 10/25/2018 3:37 PM   ATTENDING NOTE: I reviewed above note and agree with the assessment and plan. Pt was seen and examined.   Patient neurological stable, still has right-sided hemiparesis.  MRI showed left posterior limb internal capsule infarct, consistent with small vessel disease, although left ICA soft plaque caused large vessel disease cannot be ruled out.  Recommend aspirin 81 and Plavix 75 for 3 weeks and then aspirin alone.  Continue statin for stroke prevention.  Quit smoking and smoking cessation counseling again provided.  PT/OT recommend CIR.  Neurology will sign off. Please call with questions. Pt will follow up with stroke clinic NP at [facility] in about 4 weeks. Thanks for the consult.   Marvel Plan, MD PhD Stroke Neurology 10/25/2018 9:26 PM     To contact Stroke Continuity provider, please refer to [email or web address]. After hours, contact General Neurology

## 2018-10-25 NOTE — Progress Notes (Signed)
Pt admitted to 4W14. No complaints of pain. Pt alert and oriented and oriented to safety equipment. Call bell is within reach.

## 2018-10-26 ENCOUNTER — Inpatient Hospital Stay (HOSPITAL_COMMUNITY): Payer: Self-pay | Admitting: Physical Therapy

## 2018-10-26 ENCOUNTER — Inpatient Hospital Stay (HOSPITAL_COMMUNITY): Payer: Self-pay

## 2018-10-26 DIAGNOSIS — I10 Essential (primary) hypertension: Secondary | ICD-10-CM

## 2018-10-26 DIAGNOSIS — G8191 Hemiplegia, unspecified affecting right dominant side: Secondary | ICD-10-CM

## 2018-10-26 LAB — CBC WITH DIFFERENTIAL/PLATELET
Abs Immature Granulocytes: 0.03 10*3/uL (ref 0.00–0.07)
Basophils Absolute: 0.1 10*3/uL (ref 0.0–0.1)
Basophils Relative: 1 %
EOS PCT: 8 %
Eosinophils Absolute: 0.7 10*3/uL — ABNORMAL HIGH (ref 0.0–0.5)
HEMATOCRIT: 44 % (ref 39.0–52.0)
HEMOGLOBIN: 14.5 g/dL (ref 13.0–17.0)
Immature Granulocytes: 0 %
LYMPHS ABS: 1.8 10*3/uL (ref 0.7–4.0)
LYMPHS PCT: 21 %
MCH: 30.1 pg (ref 26.0–34.0)
MCHC: 33 g/dL (ref 30.0–36.0)
MCV: 91.3 fL (ref 80.0–100.0)
Monocytes Absolute: 0.7 10*3/uL (ref 0.1–1.0)
Monocytes Relative: 8 %
Neutro Abs: 5.3 10*3/uL (ref 1.7–7.7)
Neutrophils Relative %: 62 %
Platelets: 165 10*3/uL (ref 150–400)
RBC: 4.82 MIL/uL (ref 4.22–5.81)
RDW: 13 % (ref 11.5–15.5)
WBC: 8.5 10*3/uL (ref 4.0–10.5)
nRBC: 0 % (ref 0.0–0.2)

## 2018-10-26 LAB — COMPREHENSIVE METABOLIC PANEL
ALBUMIN: 3.4 g/dL — AB (ref 3.5–5.0)
ALK PHOS: 35 U/L — AB (ref 38–126)
ALT: 15 U/L (ref 0–44)
ANION GAP: 6 (ref 5–15)
AST: 16 U/L (ref 15–41)
BILIRUBIN TOTAL: 0.5 mg/dL (ref 0.3–1.2)
BUN: 15 mg/dL (ref 8–23)
CALCIUM: 8.9 mg/dL (ref 8.9–10.3)
CHLORIDE: 107 mmol/L (ref 98–111)
CO2: 23 mmol/L (ref 22–32)
CREATININE: 1.23 mg/dL (ref 0.61–1.24)
GFR calc non Af Amer: >60 mL/min (ref >60)
Glucose, Bld: 103 mg/dL — ABNORMAL HIGH (ref 70–99)
Potassium: 3.8 mmol/L (ref 3.5–5.1)
Sodium: 136 mmol/L (ref 135–145)
Total Protein: 6.8 g/dL (ref 6.5–8.1)

## 2018-10-26 NOTE — Progress Notes (Signed)
David Mosley PHYSICAL MEDICINE & REHABILITATION PROGRESS NOTE   Subjective/Complaints:  New issues overnight.  Patient is receiving OT services and he is quite pleased about washing up.  He denies any significant pain  Review of systems negative for chest pain shortness of breath nausea vomiting diarrhea or constipation.  Objective:   Mr David Mosley Contrast  Result Date: 10/24/2018 CLINICAL DATA:  RIGHT-sided weakness. History of LEFT ICA stenosis, meningitis, hypertension. EXAM: MRI HEAD WITHOUT CONTRAST TECHNIQUE: Multiplanar, multiecho pulse sequences of the David and surrounding structures were obtained without intravenous contrast. COMPARISON:  CT HEAD October 23, 2018 FINDINGS: Moderately motion degraded examination. INTRACRANIAL CONTENTS: 18 x 6 mm ovoid reduced diffusion LEFT internal capsule to basal ganglia with low ADC values. No susceptibility artifact to suggest hemorrhage. Scattered subcentimeter supratentorial white matter FLAIR T2 hyperintensities. Moderate supratentorial parenchymal David volume loss. No hydrocephalus. No suspicious parenchymal signal, masses, mass effect. No abnormal extra-axial fluid collections. No extra-axial masses. VASCULAR: Normal major intracranial vascular flow voids present at skull base. SKULL AND UPPER CERVICAL SPINE: No abnormal sellar expansion. No suspicious calvarial bone marrow signal. Craniocervical junction maintained. SINUSES/ORBITS: The mastoid air-cells and included paranasal sinuses are well-aerated.The included ocular globes and orbital contents are non-suspicious. OTHER: Patient is edentulous. IMPRESSION: 1. Moderately motion degraded examination. Acute nonhemorrhagic LEFT internal capsule/basal ganglia infarct. 2. Moderate parenchymal David volume loss, advanced for age. 3. Mild chronic small vessel ischemic changes. 4. These results will be called to the ordering clinician or representative by the professional radiologist assistant, and  communication documented in [facility] Dashboard. Electronically Signed   By: Awilda Metro M.D.   On: 10/24/2018 22:32   Dg Knee Right Port  Result Date: 10/25/2018 CLINICAL DATA:  Knee injury EXAM: PORTABLE RIGHT KNEE - 1-2 VIEW COMPARISON:  None. FINDINGS: No fracture or malalignment. Small knee effusion. Mild patellofemoral and lateral degenerative changes. IMPRESSION: No acute osseous abnormality. Small moderate knee effusion with arthritis. Electronically Signed   By: Jasmine Pang M.D.   On: 10/25/2018 00:07   Vas US Carotid  Result Date: 10/24/2018 Carotid Arterial Duplex Study Indications:  CVA and Weakness. Risk Factors: Hypertension, hyperlipidemia, current smoker, PAD. Performing Technologist: Sherren Kerns RVS  Examination Guidelines: A complete evaluation includes B-mode imaging, spectral Doppler, color Doppler, and power Doppler as needed of all accessible portions of each vessel. Bilateral testing is considered an integral part of a complete examination. Limited examinations for reoccurring indications may be performed as noted.  Right Carotid Findings: +----------+--------+--------+--------+-----------+------------------+           PSV cm/sEDV cm/sStenosisDescribe   Comments           +----------+--------+--------+--------+-----------+------------------+ CCA Prox  91      24                         intimal thickening +----------+--------+--------+--------+-----------+------------------+ CCA Distal55      12                         intimal thickening +----------+--------+--------+--------+-----------+------------------+ ICA Prox  41      13              homogeneous                   +----------+--------+--------+--------+-----------+------------------+ ICA Distal123     32                                            +----------+--------+--------+--------+-----------+------------------+  ECA       131     24                                             +----------+--------+--------+--------+-----------+------------------+ +----------+--------+-------+--------+-------------------+           PSV cm/sEDV cmsDescribeArm Pressure (mmHG) +----------+--------+-------+--------+-------------------+ [id number]                                         +----------+--------+-------+--------+-------------------+ +---------+--------+--+--------+--+ VertebralPSV cm/s74EDV cm/s20 +---------+--------+--+--------+--+  Left Carotid Findings: +----------+--------+--------+--------+------------+------------------+           PSV cm/sEDV cm/sStenosisDescribe    Comments           +----------+--------+--------+--------+------------+------------------+ CCA Prox  102     18                          intimal thickening +----------+--------+--------+--------+------------+------------------+ CCA Distal60      13                          intimal thickening +----------+--------+--------+--------+------------+------------------+ ICA Prox  140     49      40-59%  heterogenousShadowing          +----------+--------+--------+--------+------------+------------------+ ICA Mid   120     39                                             +----------+--------+--------+--------+------------+------------------+ ICA Distal82      24                                             +----------+--------+--------+--------+------------+------------------+ ECA       100     13                                             +----------+--------+--------+--------+------------+------------------+ +----------+--------+--------+--------+-------------------+ SubclavianPSV cm/sEDV cm/sDescribeArm Pressure (mmHG) +----------+--------+--------+--------+-------------------+           58                                          +----------+--------+--------+--------+-------------------+ +---------+--------+--+--------+--+ VertebralPSV cm/s44EDV cm/s12  +---------+--------+--+--------+--+  Summary: Right Carotid: Velocities in the right ICA are consistent with a 1-39% stenosis. Left Carotid: Velocities in the left ICA are consistent with a 40-59% stenosis. Vertebrals:  Bilateral vertebral arteries demonstrate antegrade flow. Subclavians: Normal flow hemodynamics were seen in bilateral subclavian              arteries. *See table(s) above for measurements and observations.  Electronically signed by Delia Heady MD on 10/24/2018 at 3:52:16 PM.    Final    Recent Labs    10/23/18 0840 10/23/18 0845 10/24/18 0330  WBC 9.0  --  7.4  HGB 13.7 13.9 13.3  HCT 42.0 41.0 41.8  PLT 179  --  177   Recent Labs    10/24/18 0330 10/25/18 0410  NA 137 136  K 3.7 3.7  CL 109 107  CO2 22 23  GLUCOSE 109* 93  BUN 11 17  CREATININE 1.13 1.21  CALCIUM 8.5* 8.7*    Intake/Output Summary (Last 24 hours) at 10/26/2018 0453 Last data filed at 10/26/2018 0345 Gross per 24 hour  Intake -  Output 875 ml  Net -875 ml     Physical Exam: Vital Signs Blood pressure (!) 149/76, pulse 63, temperature 98.5 F (36.9 C), temperature source Oral, resp. rate 18, height 5\' 8"  (1.727 m), weight 95.6 kg, SpO2 100 %.   General: No acute distress Mood and affect are appropriate Heart: Regular rate and rhythm no rubs murmurs or extra sounds Lungs: Clear to auscultation, breathing unlabored, no rales or wheezes Abdomen: Positive bowel sounds, soft nontender to palpation, nondistended Extremities: No clubbing, cyanosis, or edema Skin: No evidence of breakdown, no evidence of rash Neurologic: Cranial nerves II through XII intact, motor strength is 5/5 in left deltoid, bicep, tricep, grip, hip flexor, knee extensors, ankle dorsiflexor and plantar flexor, 4/5 on the right Sensory exam normal sensation to light touch and proprioception in bilateral upper and lower extremities Cerebellar exam normal finger to nose to finger as well as heel to shin in bilateral upper and  lower extremities Musculoskeletal: Full range of motion in all 4 extremities. No joint swelling  Assessment/Plan: 1. 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  Physiatrist is providing close team supervision and 24 hour management of active medical problems listed below.  Physiatrist and rehab team continue to assess barriers to discharge/monitor patient progress toward functional and medical goals  Care Tool:  Bathing              Bathing assist       Upper Body Dressing/Undressing Upper body dressing        Upper body assist      Lower Body Dressing/Undressing Lower body dressing            Lower body assist       Toileting Toileting    Toileting assist Assist for toileting: Independent     Transfers Chair/bed transfer  Transfers assist           Locomotion Ambulation   Ambulation assist              Walk 10 feet activity   Assist           Walk 50 feet activity   Assist           Walk 150 feet activity   Assist           Walk 10 feet on uneven surface  activity   Assist           Wheelchair     Assist               Wheelchair 50 feet with 2 turns activity    Assist            Wheelchair 150 feet activity     Assist           Medical Problem List and Plan: 1.Functional deficitssecondary toLeft internal capsule/basal ganglia stroke. CIR PT, OT, SLP evaluations today 2. DVT Prophylaxis/Anticoagulation: Pharmaceutical:Lovenox 3. Pain Management:Tylenol and ice  as needed for right knee pain. Add baclofen as needed for spasms. 4. Mood:LCSW to follow for evaluation and support. 5. Neuropsych: This patientiscapable of making decisions on own behalf. 6. Skin/Wound Care:Routine pressure relief  measures. Maintain adequate nutrition and hydration status. 7. Fluids/Electrolytes/Nutrition:Monitor I's and O's. Will order double portions as patient reporting hunger. Check CMETin a.m. 8.Hypertension:Monitor blood pressures twice daily. Continue Cardizem and Metoprolol. Vitals:   10/25/18 1947 10/26/18 0503  BP: (!) 149/76 (!) 157/96  Pulse: 63 (!) 59  Resp: 18 18  Temp: 98.5 F (36.9 C) 98.2 F (36.8 C)  SpO2: 100% 93%  Blood pressure in permissive range currently 9. History of PSVT: Monitorheart rate twice daily basis. Continue Cardizem, Metoprolol and Tambocor 10.Tobacco abuse: Has been weaning himself in the past few months. Will continue nicotine patch. 11. Dyslipidemia: on Lipitor. 12. Constipation: Start Miralax.  Monitor pattern    LOS: 1 days A FACE TO FACE EVALUATION WAS PERFORMED  Erick Colace 10/26/2018, 4:53 AM

## 2018-10-26 NOTE — Evaluation (Signed)
Physical Therapy Assessment and Plan  Patient Details  Name: David Mosley MRN: [id number] Date of Birth: 1956-04-24  PT Diagnosis: Abnormal posture, Abnormality of gait, Hemiplegia dominant and Muscle weakness Rehab Potential: Excellent ELOS: 12-16   Today's Date: 10/26/2018 PT Individual Time: 1315-1410 and [id number] PT Individual Time Calculation (min): 55 min and 60 min   Problem List:  Patient Active Problem List   Diagnosis Date Noted  . Embolic stroke ([place]) 81/84/0375  . CVA (cerebral vascular accident) ([place]) 10/23/2018  . Wide-complex tachycardia ([place]) 03/28/2018  . Hypothyroidism 03/28/2018  . Elevated AST (SGOT) 03/28/2018  . Chest pain 03/28/2018  . Tobacco use 03/28/2018  . Sleep apnea 03/28/2018  . Paroxysmal ventricular tachycardia ([place])   . PSVT (paroxysmal supraventricular tachycardia) ([place]) 03/26/2018  . Essential hypertension, benign 05/29/2016  . Thyroid activity decreased 03/21/2016  . Edema 03/21/2016  . PVD (peripheral vascular disease) ([place]) 03/21/2016  . Cigarette nicotine dependence with nicotine-induced disorder 03/21/2016    Past Medical History:  Past Medical History:  Diagnosis Date  . Hypertension   . Hypothyroidism   . Meningitis   . Pneumonia   . PSVT (paroxysmal supraventricular tachycardia) ([place])   . PVD (peripheral vascular disease) ([place])   . Tobacco use    Past Surgical History:  Past Surgical History:  Procedure Laterality Date  . NO PAST SURGERIES      Assessment & Plan Clinical Impression: David Mosley is a 62 year old male with history ofHTN, PSVT, meningitis, tobacco abuse; who was admitted on 10/23/2018 with numbness and tingling right upper extremity and right lower extremity as well as weakness. UDS negative. CT perfusion head/neck done revealing no infarct or penumbra. CTA head/neck showed bulky soft plaque left ICA origin and bulb with high-grade left ICA stenosis approaching radiographic string sign otherwise negative  for large vessel occlusion. TPA not given as patient now a window for treatment and was transferred to [facility] for evaluation and treatment. Carotid Dopplers done revealing 40 to 59% left ICA stenosis. 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 Dr Rhodia Albright that stroke likely due to small versus large vessel disease--left ICA soft plaque. He recommended aspirin Plavix x3 weeks followed by aspirin alone. Patient with improvement in sensation on right side however continues to be limited by right-sided weakness, deficits and gait as well as ability to carry out ADLs. CIR recommended for follow-up therapy Patient transferred to CIR on 10/25/2018 .    Patient currently requires mod with mobility secondary to muscle weakness, impaired timing and sequencing, abnormal tone, unbalanced muscle activation, motor apraxia, decreased coordination and decreased motor planning and decreased standing balance, decreased postural control, hemiplegia and decreased balance strategies.  Prior to hospitalization, patient was independent  with mobility and lived with Alone in a one level home.  Home access is 2Stairs to enter.  Patient will benefit from skilled PT intervention to maximize safe functional mobility, minimize fall risk and decrease caregiver burden for planned discharge home with intermittent assist.  Anticipate patient will benefit from follow up [facility] at discharge.    PT - End of Session Activity Tolerance: Tolerates 30+ min activity with multiple rests Endurance Deficit: Yes PT Assessment Rehab Potential (ACUTE/IP ONLY): Excellent PT Barriers to Discharge: Decreased caregiver support;Home environment access/layout;Insurance for SNF coverage;Lack of/limited family support PT Barriers to Discharge Comments: Pt has 2 steps to enter home, lives alone, and will likely not have 24/7 caregiver support PT  Patient demonstrates impairments in the following area(s): Balance;Safety;Endurance;Motor;Perception PT Transfers Functional Problem(s): Bed to Chair;Bed Mobility;Car PT Locomotion Functional Problem(s): Ambulation;Wheelchair Mobility;Stairs PT Plan PT Intensity: Minimum of 1-2 x/day ,45 to 90 minutes PT Frequency: 5 out of 7 days PT Duration Estimated Length of Stay: 12-16 PT Treatment/Interventions: Ambulation/gait training;Community reintegration;DME/adaptive equipment instruction;Neuromuscular re-education;Psychosocial support;Stair training;UE/LE Strength taining/ROM;Wheelchair propulsion/positioning;Balance/vestibular training;Discharge planning;Therapeutic Activities;UE/LE Coordination activities;Cognitive remediation/compensation;Disease management/prevention;Functional mobility training;Patient/family education;Splinting/orthotics;Therapeutic Exercise;Visual/perceptual remediation/compensation PT Transfers Anticipated Outcome(s): modified independent PT Locomotion Anticipated Outcome(s): modified indpendent PT Recommendation Follow Up Recommendations: Home health PT Patient destination: Home Equipment Recommended: Standard walker;To be determined  Session One: SPT instructed pt in evaluation and initiated treatment intervention; see below for details. SPT educated pt on safety procedures, rehab process, and plan of care with pt verbalized understanding/agreement.   Session Two:  Car transfer with RW. Utilized stand pivot method, min assist from PT. Verbal cues for LE sequencing getting into/out of car.  Completed 1 bout of ascend/descend 3" stairs x8 with mod assist from PT. 1 bout of ascend/descend 6" stairs x4 with mod assist from PT. Stabilization of R knee, facilitation of weight shift, and verbal cues for LE sequencing by PT.  Picking up cup off floor. 1x with RW and 1x without AD. Mod assist from PT. Facilitated equal WB for BLE and activation of RLE into extension when  returning to upright standing.  Gait training over blue mat with RW. Mod assist from PT for AD management, cuing for maintaining R knee extension during stance phase, and facilitation of weight shift.   Gait training 123' with RW. Min assist fading to mod assist in the last 40'. Verbal cues for increasing L step length, R knee extension, and AD management.  Ended session with pt seated in WC, safety belt in place, and all needs met.   PT Evaluation Precautions/Restrictions Precautions Precautions: Fall Precaution Comments: Permissive HTN 220/110 per neurology consult note Restrictions Weight Bearing Restrictions: No General   Vital SignsTherapy Vitals Temp: 98 F (36.7 C) Temp Source: Oral Pulse Rate: 65 Resp: 20 BP: (!) 154/86 Patient Position (if appropriate): Sitting Oxygen Therapy SpO2: 96 % O2 Device: Room Air Pain Pain Assessment Pain Scale: 0-10 Pain Score: 0-No pain Home Living/Prior Functioning Home Living Available Help at Discharge: Friend(s);Available PRN/intermittently Type of Home: House Home Access: Stairs to enter [employer] of Steps: 2 Entrance Stairs-Rails: Right Home Layout: One level Bathroom Shower/Tub: Product/process development scientist: Standard  Lives With: Alone Prior Function Level of Independence: Independent with basic ADLs  Able to Take Stairs?: Yes Driving: No Vocation: Part time employment Vocation Requirements: Herbalist  Vision/Perception  Praxis Praxis: Impaired Praxis-Other Comments: motor planning deficits  Cognition Overall Cognitive Status: Within Functional Limits for tasks assessed Arousal/Alertness: Awake/alert Behaviors: Impulsive Safety/Judgment: Impaired Sensation Sensation Light Touch: Appears Intact Coordination Gross Motor Movements are Fluid and Coordinated: No Fine Motor Movements are Fluid and Coordinated: No Coordination and Movement Description: slow and deliberate Motor   Motor Motor: Hemiplegia Motor - Skilled Clinical Observations: R hemi; muscle synergies present in R UE/LE  Mobility Bed Mobility Bed Mobility: Rolling Right;Rolling Left;Left Sidelying to Sit;Sit to Supine Rolling Right: Supervision/verbal cueing Rolling Left: Minimal Assistance - Patient > 75% Left Sidelying to Sit: Contact Guard/Touching assist Sit to Supine: Contact Guard/Touching assist Transfers Transfers: Stand Pivot Transfers;Sit to Stand Sit to Stand: Moderate Assistance - Patient 50-74% Stand Pivot Transfers: Moderate Assistance - Patient 50 - 74% Stand Pivot Transfer Details: Verbal cues for sequencing;Manual facilitation for weight shifting;Tactile  cues for weight shifting;Tactile cues for posture;Verbal cues for gait pattern;Verbal cues for technique;Verbal cues for safe use of DME/AE Transfer (Assistive device): Standard walker Locomotion  Gait Ambulation: Yes Gait Assistance: Maximal Assistance - Patient 25-49%;2 Helpers Gait Distance (Feet): 20 Feet Gait Assistance Details: Tactile cues for initiation;Tactile cues for posture;Visual cues for safe use of DME/AE;Verbal cues for sequencing;Verbal cues for gait pattern;Manual facilitation for weight shifting;Verbal cues for precautions/safety;Tactile cues for weight shifting;Verbal cues for technique;Visual cues/gestures for precautions/safety;Tactile cues for placement;Tactile cues for sequencing Gait Assistance Details: Max assist +2 with no AD; mod assist with RW x30' Gait Gait: Yes Gait Pattern: Impaired Gait Pattern: Decreased step length - right;Decreased stride length;Decreased dorsiflexion - right;Decreased weight shift to left;Right flexed knee in stance;Narrow base of support;Decreased stance time - right;Decreased hip/knee flexion - right;Poor foot clearance - right;Decreased step length - left;Lateral trunk lean to right;Decreased weight shift to right Wheelchair Mobility Wheelchair Mobility: Yes Wheelchair  Assistance: Minimal assistance - Patient >75%(verbal cues for even propulsion with R and L UE. ) Wheelchair Propulsion: Both upper extremities Wheelchair Parts Management: Needs assistance Distance: 100  Trunk/Postural Assessment  Cervical Assessment Cervical Assessment: Within Functional Limits Thoracic Assessment Thoracic Assessment: Within Functional Limits Lumbar Assessment Lumbar Assessment: Within Functional Limits Postural Control Postural Control: Deficits on evaluation  Balance Balance Balance Assessed: Yes Static Sitting Balance Static Sitting - Balance Support: Feet supported Static Sitting - Level of Assistance: 6: Modified independent (Device/Increase time) Dynamic Sitting Balance Dynamic Sitting - Balance Support: Feet supported Dynamic Sitting - Level of Assistance: 5: Stand by assistance Static Standing Balance Static Standing - Level of Assistance: 3: Mod assist Static Standing - Comment/# of Minutes: mod assist for static standing with no AD; min assist static standing with RW Dynamic Standing Balance Dynamic Standing - Level of Assistance: 2: Max assist Dynamic Standing - Comments: max assist without AD; mod assist with AD Extremity Assessment      RLE Assessment RLE Assessment: Exceptions to [facility] General Strength Comments: grossly 4-/5 except: DF 1/5, PF 3+/5 LLE Assessment LLE Assessment: Within Functional Limits    Refer to Care Plan for Long Term Goals  Recommendations for other services: Therapeutic Recreation  Pet therapy, Kitchen group and Stress management  Discharge Criteria: Patient will be discharged from PT if patient refuses treatment 3 consecutive times without medical reason, if treatment goals not met, if there is a change in medical status, if patient makes no progress towards goals or if patient is discharged from hospital.  The above assessment, treatment plan, treatment alternatives and goals were discussed and mutually agreed upon:  by patient  Amador Cunas 10/26/2018, 3:16 PM

## 2018-10-26 NOTE — Evaluation (Signed)
Occupational Therapy Assessment and Plan  Patient Details  Name: David Mosley MRN: [id number] Date of Birth: 12/10/1956  OT Diagnosis: apraxia, cognitive deficits, hemiplegia affecting non-dominant side and muscle weakness (generalized) Rehab Potential:   ELOS: 14-18   Today's Date: 10/26/2018 OT Individual Time: 7169-6789 OT Individual Time Calculation (min): 75 min     Problem List:  Patient Active Problem List   Diagnosis Date Noted  . Embolic stroke ([place]) 38/09/1750  . CVA (cerebral vascular accident) ([place]) 10/23/2018  . Wide-complex tachycardia ([place]) 03/28/2018  . Hypothyroidism 03/28/2018  . Elevated AST (SGOT) 03/28/2018  . Chest pain 03/28/2018  . Tobacco use 03/28/2018  . Sleep apnea 03/28/2018  . Paroxysmal ventricular tachycardia ([place])   . PSVT (paroxysmal supraventricular tachycardia) ([place]) 03/26/2018  . Essential hypertension, benign 05/29/2016  . Thyroid activity decreased 03/21/2016  . Edema 03/21/2016  . PVD (peripheral vascular disease) ([place]) 03/21/2016  . Cigarette nicotine dependence with nicotine-induced disorder 03/21/2016    Past Medical History:  Past Medical History:  Diagnosis Date  . Hypertension   . Hypothyroidism   . Meningitis   . Pneumonia   . PSVT (paroxysmal supraventricular tachycardia) ([place])   . PVD (peripheral vascular disease) ([place])   . Tobacco use    Past Surgical History:  Past Surgical History:  Procedure Laterality Date  . NO PAST SURGERIES      Assessment & Plan Clinical Impression: David Mosley is a 62 year old male with history ofHTN, PSVT, meningitis, tobacco abuse; who was admitted on 10/23/2018 with numbness and tingling right upper extremity and right lower extremity as well as weakness. UDS negative. CT perfusion head/neck done revealing no infarct or penumbra. CTA head/neck showed bulky soft plaque left ICA origin and bulb with high-grade left ICA stenosis approaching radiographic string sign otherwise negative for  large vessel occlusion. TPA not given as patient now a window for treatment and was transferred to [facility] for evaluation and treatment. Carotid Dopplers done revealing 40 to 59% left ICA stenosis. 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 Dr Rhodia Albright that stroke likely due to small versus large vessel disease--left ICA soft plaque. He recommended aspirin Plavix x3 weeks followed by aspirin alone. Patient with improvement in sensation on right side however continues to be limited by right-sided weakness, deficits and gait as well as ability to carry out ADLs. CIR recommended for follow-up therapy   Patient currently requires mod with basic self-care skills secondary to muscle weakness, decreased cardiorespiratoy endurance, impaired timing and sequencing, decreased coordination and decreased motor planning, decreased attention to right and decreased motor planning, decreased safety awareness and decreased memory and decreased sitting balance, decreased standing balance, decreased postural control, hemiplegia and decreased balance strategies.  Prior to hospitalization, patient could complete BADL/IADL with independent .  Patient will benefit from skilled intervention to decrease level of assist with basic self-care skills and increase independence with basic self-care skills prior to discharge home with care partner.  Anticipate patient will require intermittent supervision and follow up home health.  OT - End of Session Activity Tolerance: Tolerates 30+ min activity with multiple rests Endurance Deficit: Yes OT Assessment Rehab Potential (ACUTE ONLY): Good OT Barriers to Discharge: Lack of/limited family support;Decreased caregiver support OT Patient demonstrates impairments in the following area(s): Balance;Cognition;Endurance;Motor;Perception;Safety;Sensory OT Basic ADL's Functional  Problem(s): Grooming;Bathing;Dressing;Toileting OT Advanced ADL's Functional Problem(s): Simple Meal Preparation;Laundry OT Transfers Functional Problem(s): Toilet;Tub/Shower OT Additional Impairment(s): None;Fuctional Use of Upper  Extremity OT Plan OT Intensity: Minimum of 1-2 x/day, 45 to 90 minutes OT Frequency: 5 out of 7 days OT Duration/Estimated Length of Stay: 14-18 OT Treatment/Interventions: Balance/vestibular training;Discharge planning;Pain management;Self Care/advanced ADL retraining;Therapeutic Activities;UE/LE Coordination activities;Cognitive remediation/compensation;Disease mangement/prevention;Functional mobility training;Patient/family education;Skin care/wound managment;Therapeutic Exercise;Visual/perceptual remediation/compensation;Community reintegration;DME/adaptive equipment instruction;Neuromuscular re-education;Psychosocial support;UE/LE Strength taining/ROM;Wheelchair propulsion/positioning OT Self Feeding Anticipated Outcome(s): MOD I OT Basic Self-Care Anticipated Outcome(s): MOD I OT Toileting Anticipated Outcome(s): MOD I toilet; S shower OT Bathroom Transfers Anticipated Outcome(s): MOD I toilet; S shower OT Recommendation Recommendations for Other Services: Therapeutic Recreation consult Therapeutic Recreation Interventions: Pet therapy;Kitchen group;Stress management;Outing/community reintergration Patient destination: Home Follow Up Recommendations: Home health OT Equipment Recommended: 3 in 1 bedside comode;Tub/shower seat   Skilled Therapeutic Intervention 1;1. Pt educated on role/purpse of OT, CIR ELOS and POC. Pt compeltes mobility with overall MOD A for power up into standing and VC for "strong straight knee." Pt educated on importance of use of RUE functinally in all tasks. Pt with good caryover throughout session bathing most of body with RUE. Pt currently able to use RUE at diminsihed level demonstrating motor planning difficulties and decreased  coordination. OT applies foam handle to toothbrush for improved grasp and control during oral care. Pt able to cross LLE over for threading pants/socks with sueprvision and follows hemi techniques without VC. Pt left in w/c with belt alarm on, call light in reach and all needs met.   OT Evaluation Precautions/Restrictions  Precautions Precautions: Fall Precaution Comments: Permissive HTN 220/110 per neurology consult note Restrictions Weight Bearing Restrictions: No General Chart Reviewed: Yes Vital Signs  Pain Pain Assessment Pain Score: 0-No pain Home Living/Prior Functioning Home Living Family/patient expects to be discharged to:: Private residence Living Arrangements: Alone Available Help at Discharge: Friend(s), Available PRN/intermittently Bathroom Shower/Tub: Tub/shower unit, Architectural technologist: Standard  Lives With: Alone IADL History Type of Occupation: works at [employer], car pool to work Prior Function Level of Independence: Independent with basic ADLs Vocation: Part time employment ADL ADL Eating: Set up Grooming: Supervision/safety Upper Body Bathing: Minimal assistance Where Assessed-Upper Body Bathing: Retail buyer Bathing: Minimal assistance Where Assessed-Lower Body Bathing: Shower Upper Body Dressing: Supervision/safety Where Assessed-Upper Body Dressing: Sitting at sink Lower Body Dressing: Minimal assistance Where Assessed-Lower Body Dressing: Wheelchair, Sitting at sink Toileting: Moderate assistance Where Assessed-Toileting: Glass blower/designer: Moderate assistance Toilet Transfer Equipment: Geophysical data processor: Moderate assistance Social research officer, government Method: Radiographer, therapeutic: Gaffer Baseline Vision/History: Wears glasses Wears Glasses: At all times Patient Visual Report: No change from baseline Eye Alignment: Within Functional Limits Ocular Range of Motion: Within  Functional Limits Alignment/Gaze Preference: Within Defined Limits Tracking/Visual Pursuits: Able to track stimulus in all quads without difficulty Convergence: Within functional limits Visual Fields: No apparent deficits Perception  Perception: Within Functional Limits Praxis Praxis: Impaired Praxis Impairment Details: Motor planning Cognition Overall Cognitive Status: Within Functional Limits for tasks assessed Arousal/Alertness: Awake/alert Orientation Level: Person;Place;Situation Place: Oriented Situation: Oriented Year: 2019 Month: November Day of Week: Incorrect Memory: Impaired Immediate Memory Recall: Sock;Blue;Bed Attention: Selective Selective Attention: Appears intact Behaviors: Impulsive Safety/Judgment: Impaired Sensation Sensation Light Touch: Appears Intact Hot/Cold: Appears Intact Coordination Gross Motor Movements are Fluid and Coordinated: No Fine Motor Movements are Fluid and Coordinated: No Coordination and Movement Description: slow and deliberate Motor  Motor Motor: Hemiplegia Motor - Skilled Clinical Observations: R hemi Mobility  Transfers Sit to Stand: Moderate Assistance - Patient 50-74%  Trunk/Postural Assessment  Cervical Assessment Cervical  Assessment: Within Functional Limits Thoracic Assessment Thoracic Assessment: Within Functional Limits Lumbar Assessment Lumbar Assessment: Within Functional Limits Postural Control Postural Control: Deficits on evaluation(insufficient)  Balance Balance Balance Assessed: Yes Dynamic Sitting Balance Dynamic Sitting - Level of Assistance: 5: Stand by assistance Sitting balance - Comments: bathing Static Standing Balance Static Standing - Balance Support: Right upper extremity supported Static Standing - Level of Assistance: 4: Min assist Extremity/Trunk Assessment RUE Assessment RUE Assessment: Within Functional Limits Active Range of Motion (AROM) Comments: shoulder flexion/ab 0-95,   General Strength Comments: 3-/5 shoulder, 4-/5 elbow/wrist, 3/5 digit LUE Assessment LUE Assessment: Within Functional Limits     Refer to Care Plan for Long Term Goals  Recommendations for other services: Therapeutic Recreation  Pet therapy, Kitchen group, Stress management and Outing/community reintegration   Discharge Criteria: Patient will be discharged from OT if patient refuses treatment 3 consecutive times without medical reason, if treatment goals not met, if there is a change in medical status, if patient makes no progress towards goals or if patient is discharged from hospital.  The above assessment, treatment plan, treatment alternatives and goals were discussed and mutually agreed upon: by patient  Tonny Branch 10/26/2018, 10:41 AM

## 2018-10-27 NOTE — Progress Notes (Addendum)
[facility] PHYSICAL MEDICINE & REHABILITATION PROGRESS NOTE   Subjective/Complaints:  Doing orofacial hygiene at sink, no new issues overnight  Review of systems negative for chest pain shortness of breath nausea vomiting diarrhea or constipation.  Objective:   No results found. Recent Labs    10/26/18 0604  WBC 8.5  HGB 14.5  HCT 44.0  PLT 165   Recent Labs    10/25/18 0410 10/26/18 0604  NA 136 136  K 3.7 3.8  CL 107 107  CO2 23 23  GLUCOSE 93 103*  BUN 17 15  CREATININE 1.21 1.23  CALCIUM 8.7* 8.9    Intake/Output Summary (Last 24 hours) at 10/27/2018 1045 Last data filed at 10/27/2018 0755 Gross per 24 hour  Intake 920 ml  Output 1025 ml  Net -105 ml     Physical Exam: Vital Signs Blood pressure 135/70, pulse 70, temperature 98.3 F (36.8 C), temperature source Oral, resp. rate 16, height 5\' 8"  (1.727 m), weight 95.6 kg, SpO2 96 %.   General: No acute distress Mood and affect are appropriate Heart: Regular rate and rhythm no rubs murmurs or extra sounds Lungs: Clear to auscultation, breathing unlabored, no rales or wheezes Abdomen: Positive bowel sounds, soft nontender to palpation, nondistended Extremities: No clubbing, cyanosis, or edema Skin: No evidence of breakdown, no evidence of rash Neurologic: Cranial nerves II through XII intact, motor strength is 5/5 in left deltoid, bicep, tricep, grip, hip flexor, knee extensors, ankle dorsiflexor and plantar flexor, 4/5 on the right Sensory exam normal sensation to light touch and proprioception in bilateral upper and lower extremities Cerebellar exam normal finger to nose to finger as well as heel to shin in bilateral upper and lower extremities Musculoskeletal: Full range of motion in all 4 extremities. No joint swelling  Assessment/Plan: 1. 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  Physiatrist is providing close team supervision and 24 hour management of active medical problems listed below.  Physiatrist and rehab team continue to assess barriers to discharge/monitor patient progress toward functional and medical goals  Care Tool:  Bathing    Body parts bathed by patient: Right arm, Left arm, Chest, Abdomen, Front perineal area, Buttocks, Right upper leg, Left upper leg, Left lower leg, Right lower leg, Face         Bathing assist Assist Level: Minimal Assistance - Patient > 75%     Upper Body Dressing/Undressing Upper body dressing   What is the patient wearing?: Pull over shirt    Upper body assist Assist Level: Set up assist    Lower Body Dressing/Undressing Lower body dressing      What is the patient wearing?: Underwear/pull up     Lower body assist Assist for lower body dressing: Minimal Assistance - Patient > 75%     Toileting Toileting    Toileting assist Assist for toileting: Moderate Assistance - Patient 50 - 74%     Transfers Chair/bed transfer  Transfers assist     Chair/bed transfer assist level: Moderate Assistance - Patient 50 - 74%     Locomotion Ambulation   Ambulation assist      Assist level: 2 helpers   Max distance: 30   Walk 10 feet activity   Assist     Assist level: 2 helpers     Walk 50 feet activity   Assist  Walk 150 feet activity   Assist           Walk 10 feet on uneven surface  activity   Assist           Wheelchair     Assist   Type of Wheelchair: Manual    Wheelchair assist level: Minimal Assistance - Patient > 75% Max wheelchair distance: 100    Wheelchair 50 feet with 2 turns activity    Assist        Assist Level: Minimal Assistance - Patient > 75%   Wheelchair 150 feet activity     Assist           Medical Problem List and Plan: 1.Functional deficitssecondary toLeft  internal capsule/basal ganglia stroke. CIR PT, OT, SLP  2. DVT Prophylaxis/Anticoagulation: Pharmaceutical:Lovenox 3. Pain Management:Tylenol and ice as needed for right knee pain. Add baclofen as needed for spasms. 4. Mood:LCSW to follow for evaluation and support. 5. Neuropsych: This patientiscapable of making decisions on own behalf. 6. Skin/Wound Care:Routine pressure relief measures. Maintain adequate nutrition and hydration status. 7. Fluids/Electrolytes/Nutrition:Monitor I's and O's. Will order double portions as patient reporting hunger. Labs reviewed, normal kidney function 8.Hypertension:Monitor blood pressures twice daily. Continue Cardizem and Metoprolol. Vitals:   10/26/18 2058 10/27/18 0435  BP: (!) 144/74 135/70  Pulse: 65 70  Resp:  16  Temp:  98.3 F (36.8 C)  SpO2:  96%  Blood pressure control this a.m. 9. History of PSVT: Monitorheart rate twice daily basis. Continue Cardizem, Metoprolol and Tambocor 10.Tobacco abuse: Has been weaning himself in the past few months. Will continue nicotine patch. 11. Dyslipidemia: on Lipitor. 12. Constipation: Start Miralax.  Dulcolax suppository every 3 days as needed no BM    LOS: 2 days A FACE TO FACE EVALUATION WAS PERFORMED  Erick Colace 10/27/2018, 10:45 AM

## 2018-10-28 ENCOUNTER — Inpatient Hospital Stay (HOSPITAL_COMMUNITY): Payer: Self-pay | Admitting: Physical Therapy

## 2018-10-28 ENCOUNTER — Inpatient Hospital Stay (HOSPITAL_COMMUNITY): Payer: Self-pay | Admitting: Occupational Therapy

## 2018-10-28 DIAGNOSIS — I6349 Cerebral infarction due to embolism of other cerebral artery: Secondary | ICD-10-CM

## 2018-10-28 NOTE — Progress Notes (Addendum)
[facility] PHYSICAL MEDICINE & REHABILITATION PROGRESS NOTE   Subjective/Complaints:  Frustrated that he can't get up on his own. Happy that he's getting stronger on right side. Complains of pain in right knee when standing. Feels that leg will give out.  ROS: Patient denies fever, rash, sore throat, blurred vision, nausea, vomiting, diarrhea, cough, shortness of breath or chest pain, joint or back pain, headache, or mood change.   Objective:   No results found. Recent Labs    10/26/18 0604  WBC 8.5  HGB 14.5  HCT 44.0  PLT 165   Recent Labs    10/26/18 0604  NA 136  K 3.8  CL 107  CO2 23  GLUCOSE 103*  BUN 15  CREATININE 1.23  CALCIUM 8.9    Intake/Output Summary (Last 24 hours) at 10/28/2018 0904 Last data filed at 10/28/2018 0710 Gross per 24 hour  Intake 720 ml  Output 1350 ml  Net -630 ml     Physical Exam: Vital Signs Blood pressure (!) 138/94, pulse 60, temperature 98 F (36.7 C), temperature source Oral, resp. rate 18, height 5\' 8"  (1.727 m), weight 95.6 kg, SpO2 95 %.   Constitutional: No distress . Vital signs reviewed. HEENT: EOMI, oral membranes moist Neck: supple Cardiovascular: RRR without murmur. No JVD    Respiratory: CTA Bilaterally without wheezes or rales. Normal effort    GI: BS +, non-tender, non-distended  Neurologic: Cranial nerves II through XII intact, motor strength is 5/5 in left deltoid, bicep, tricep, grip, hip flexor, knee extensors, ankle dorsiflexor and plantar flexor, 4 to 4+/5 on the right Sensory exam normal sensation to light touch and proprioception in bilateral upper and lower extremities. Improved coordination RUE and RLE.    Musculoskeletal: Full range of motion in all 4 extremities. No joint swelling. Mild pain with palpation of right knee--chronic changes of joint  Assessment/Plan: 1. 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  Physiatrist is providing close team supervision and 24 hour management of active medical problems listed below.  Physiatrist and rehab team continue to assess barriers to discharge/monitor patient progress toward functional and medical goals  Care Tool:  Bathing    Body parts bathed by patient: Right arm, Left arm, Chest, Abdomen, Front perineal area, Buttocks, Right upper leg, Left upper leg, Left lower leg, Right lower leg, Face         Bathing assist Assist Level: Minimal Assistance - Patient > 75%     Upper Body Dressing/Undressing Upper body dressing   What is the patient wearing?: Pull over shirt    Upper body assist Assist Level: Set up assist    Lower Body Dressing/Undressing Lower body dressing      What is the patient wearing?: Underwear/pull up     Lower body assist Assist for lower body dressing: Minimal Assistance - Patient > 75%     Toileting Toileting    Toileting assist Assist for toileting: Minimal Assistance - Patient > 75%     Transfers Chair/bed transfer  Transfers assist     Chair/bed transfer assist level: Minimal Assistance - Patient > 75%     Locomotion Ambulation   Ambulation assist      Assist level: 2 helpers   Max distance: 30   Walk 10 feet activity   Assist     Assist level: 2 helpers  Walk 50 feet activity   Assist           Walk 150 feet activity   Assist           Walk 10 feet on uneven surface  activity   Assist           Wheelchair     Assist   Type of Wheelchair: Manual    Wheelchair assist level: Minimal Assistance - Patient > 75% Max wheelchair distance: 100    Wheelchair 50 feet with 2 turns activity    Assist        Assist Level: Minimal Assistance - Patient > 75%   Wheelchair 150 feet activity     Assist           Medical Problem List and Plan: 1.Functional  deficitssecondary toLeft internal capsule/basal ganglia stroke. --Continue CIR therapies including PT, OT, and SLP  2. DVT Prophylaxis/Anticoagulation: Pharmaceutical:Lovenox 3. Pain Management:Tylenol and ice as needed for right knee pain. Add baclofen as needed for spasms.  -will ask for hinged neoprene brace for right knee 4. Mood:LCSW to follow for evaluation and support. 5. Neuropsych: This patientiscapable of making decisions on own behalf. 6. Skin/Wound Care:Routine pressure relief measures. Maintain adequate nutrition and hydration status. 7. Fluids/Electrolytes/Nutrition:Monitor I's and O's. -excellent PO intake 8.Hypertension:Monitor blood pressures twice daily. Continue Cardizem and Metoprolol. Vitals:   10/27/18 2047 10/28/18 0535  BP: (!) 157/95 (!) 138/94  Pulse: 66 60  Resp: 18 18  Temp: 98 F (36.7 C) 98 F (36.7 C)  SpO2: 96% 95%  Blood pressure borderline 11/11 9. History of PSVT: Monitorheart rate twice daily basis. Continue Cardizem, Metoprolol and Tambocor 10.Tobacco abuse:  continue nicotine patch. 11. Dyslipidemia: on Lipitor. 12. Constipation: Start Miralax.  Dulcolax suppository every 3 days as needed no BM    LOS: 3 days A FACE TO FACE EVALUATION WAS PERFORMED  Ranelle Oyster 10/28/2018, 9:04 AM

## 2018-10-28 NOTE — Progress Notes (Signed)
Occupational Therapy Session Note  Patient Details  Name: David Mosley MRN: [id number] Date of Birth: Jan 29, 1956  Today's Date: 10/28/2018 OT Individual Time: 7829-5621  OT Individual Time Calculation (min): 57 min    Short Term Goals: Week 1:  OT Short Term Goal 1 (Week 1): Pt will complete toilet transer wiht min A OT Short Term Goal 2 (Week 1): Pt will advance pants past hips wiht A for balance only to demo improved use of RUE functionally OT Short Term Goal 3 (Week 1): Pt will don B socks with supervision/VC PRN OT Short Term Goal 4 (Week 1): Pt will complete oral care with RUE and no foam handle  Skilled Therapeutic Interventions/Progress Updates:    Upon entering the room, pt supine in bed with no c/o pain. Pt reports, " I have got to take a shower." Pt ambulating with RW and min A 15' into bathroom without LOB. Pt doffing clothing items with min A for standing balance and able to have BM. Pt performed hygiene tasks while seated with supervision. Pt transitioned to TTB with min A and use of RW. Pt utilized R UE for functional tasks without cuing needed to do so. Pt able to hold onto wash cloth, pour soap, and move items around in shower with R UE with increased time and effort. Pt transferred onto commode chair to don clothing items with overall min A for standing balance and min cuing for hemiplegic technique. Pt ambulating to recliner chair in same manner as above. OT assisted pt with donning socks and shoes secondary to time management. Pt remained seated in recliner with call bell and all needed items within reach.   Therapy Documentation Precautions:  Precautions Precautions: Fall Precaution Comments: Permissive HTN 220/110 per neurology consult note Restrictions Weight Bearing Restrictions: No ADL: ADL Eating: Set up Grooming: Supervision/safety Upper Body Bathing: Minimal assistance Where Assessed-Upper Body Bathing: Shower Lower Body Bathing: Minimal assistance Where  Assessed-Lower Body Bathing: Shower Upper Body Dressing: Supervision/safety Where Assessed-Upper Body Dressing: Sitting at sink Lower Body Dressing: Minimal assistance Where Assessed-Lower Body Dressing: Wheelchair, Sitting at sink Toileting: Moderate assistance Where Assessed-Toileting: Teacher, adult education: Moderate assistance Toilet Transfer Equipment: Psychiatric nurse: Moderate assistance Film/video editor Method: Warden/ranger: Emergency planning/management officer   Therapy/Group: Individual Therapy  Alen Bleacher 10/28/2018, 12:39 PM

## 2018-10-28 NOTE — Progress Notes (Signed)
Occupational Therapy Session Note  Patient Details  Name: David Mosley MRN: [id number] Date of Birth: Nov 04, 1956  Today's Date: 10/28/2018 OT Individual Time: 1300-1330 and 1445-1530 OT Individual Time Calculation (min): 30 min and 45 min    Short Term Goals: Week 1:  OT Short Term Goal 1 (Week 1): Pt will complete toilet transer wiht min A OT Short Term Goal 2 (Week 1): Pt will advance pants past hips wiht A for balance only to demo improved use of RUE functionally OT Short Term Goal 3 (Week 1): Pt will don B socks with supervision/VC PRN OT Short Term Goal 4 (Week 1): Pt will complete oral care with RUE and no foam handle  Skilled Therapeutic Interventions/Progress Updates:    Session 1: Upon entering the room, pt seated in recliner chair awaiting OT arrival. Pt agreeable to OT intervention with focus on fine motor coordination for functional tasks. Pt able to doff B shoes and lace them with use of B UEs and increased time. Pt utilizing figure four position and donning shoes and able to tie each shoe with increased time as well. Pt was very excited since he has not been able to accomplish task prior to this session. Pt remained in recliner chair with call bell and all needed items within reach.   Session 2: Upon entering the room, pt supine in bed with RN present in the room.Pt with no c/o pain this session. Pt performed supine >sit with supervision and sit >stand with min guard. Pt ambulating with RW and min A 75' to day room with min verbal cuing for forward gaze and safety. Pt seated at table to address strengthening and fine motor coordination with use of red theraputty. Pt needing multiple rest breaks secondary to hand fatigue but able to perform tasks successfully. Pt returning to room in same manner as above. Pt seated in wheelchair with family present in room. Call bell and all needed items within reach.   Therapy Documentation Precautions:  Precautions Precautions: Fall Precaution  Comments: Permissive HTN 220/110 per neurology consult note Restrictions Weight Bearing Restrictions: No General:   Vital Signs: Therapy Vitals Temp: 98 F (36.7 C) Temp Source: Oral Pulse Rate: 63 Resp: 18 BP: 136/75 Patient Position (if appropriate): Sitting Oxygen Therapy SpO2: 100 % O2 Device: Room Air Pain:   ADL: ADL Eating: Set up Grooming: Supervision/safety Upper Body Bathing: Minimal assistance Where Assessed-Upper Body Bathing: Shower Lower Body Bathing: Minimal assistance Where Assessed-Lower Body Bathing: Shower Upper Body Dressing: Supervision/safety Where Assessed-Upper Body Dressing: Sitting at sink Lower Body Dressing: Minimal assistance Where Assessed-Lower Body Dressing: Wheelchair, Sitting at sink Toileting: Moderate assistance Where Assessed-Toileting: Teacher, adult education: Moderate assistance Toilet Transfer Equipment: Psychiatric nurse: Moderate assistance Film/video editor Method: Warden/ranger: Emergency planning/management officer   Therapy/Group: Individual Therapy  Alen Bleacher 10/28/2018, 4:07 PM

## 2018-10-28 NOTE — Progress Notes (Signed)
Orthopedic Tech Progress Note Patient Details:  David Mosley [birth year] [id number]  Patient ID: David Mosley, male   DOB: 1956-11-24, 62 y.o.   MRN: [id number]   David Mosley 10/28/2018, 10:59 AMCalled [facility] for right neoprene hinged knee brace.

## 2018-10-28 NOTE — Progress Notes (Signed)
Patient information reviewed and entered into eRehab system by Johnmatthew Solorio, RN, CRRN, PPS Coordinator.  Information including medical coding, functional ability and quality indicators will be reviewed and updated through discharge.    

## 2018-10-28 NOTE — IPOC Note (Signed)
Overall Plan of Care [facility]) Patient Details Name: Hanford Lust MRN: [id number] DOB: 1956/11/14  Admitting Diagnosis: <principal problem not specified>  Hospital Problems: Active Problems:   Embolic stroke [facility])     Functional Problem List: Nursing Bowel, Endurance, Medication Management, Motor, Safety, Pain, Skin Integrity  PT Balance, Safety, Endurance, Motor, Perception  OT Balance, Cognition, Endurance, Motor, Perception, Safety, Sensory  SLP    TR         Basic ADL's: OT Grooming, Bathing, Dressing, Toileting     Advanced  ADL's: OT Simple Meal Preparation, Laundry     Transfers: PT Bed to Chair, Bed Mobility, Customer service manager, Tub/Shower     Locomotion: PT Ambulation, Psychologist, prison and probation services, Stairs     Additional Impairments: OT None, Fuctional Use of Upper Extremity  SLP        TR      Anticipated Outcomes Item Anticipated Outcome  Self Feeding MOD I  Swallowing      Basic self-care  MOD I  Toileting  MOD I toilet; S shower   Bathroom Transfers MOD I toilet; S shower  Bowel/Bladder  Pt will manage bowel and bladder with mod I assist  Transfers  modified independent  Locomotion  modified indpendent  Communication     Cognition     Pain  Pt will manage pain at 3 or less on a scale of 0-10.   Safety/Judgment  Pt will remain free of falls with injury while in rehab with min assist    Therapy Plan: PT Intensity: Minimum of 1-2 x/day ,45 to 90 minutes PT Frequency: 5 out of 7 days PT Duration Estimated Length of Stay: 12-16 OT Intensity: Minimum of 1-2 x/day, 45 to 90 minutes OT Frequency: 5 out of 7 days OT Duration/Estimated Length of Stay: 14-18      Team Interventions: Nursing Interventions Patient/Family Education, Disease Management/Prevention, Pain Management, Bowel Management, Medication Management, Skin Care/Wound Management, Discharge Planning  PT interventions Ambulation/gait training, Community reintegration, DME/adaptive equipment  instruction, Neuromuscular re-education, Psychosocial support, Stair training, UE/LE Strength taining/ROM, Wheelchair propulsion/positioning, Warden/ranger, Discharge planning, Therapeutic Activities, UE/LE Coordination activities, Cognitive remediation/compensation, Disease management/prevention, Functional mobility training, Patient/family education, Splinting/orthotics, Therapeutic Exercise, Visual/perceptual remediation/compensation  OT Interventions Balance/vestibular training, Discharge planning, Pain management, Self Care/advanced ADL retraining, Therapeutic Activities, UE/LE Coordination activities, Cognitive remediation/compensation, Disease mangement/prevention, Functional mobility training, Patient/family education, Skin care/wound managment, Therapeutic Exercise, Visual/perceptual remediation/compensation, Firefighter, Fish farm manager, Neuromuscular re-education, Psychosocial support, UE/LE Strength taining/ROM, Wheelchair propulsion/positioning  SLP Interventions    TR Interventions    SW/CM Interventions Discharge Planning, Psychosocial Support, Patient/Family Education   Barriers to Discharge MD  Medical stability  Nursing      PT Decreased caregiver support, Home environment Best boy, Insurance for SNF coverage, Lack of/limited family support Pt has 2 steps to enter home, lives alone, and will likely not have 24/7 caregiver support  OT Lack of/limited family support, Decreased caregiver support    SLP      SW       Team Discharge Planning: Destination: PT-Home ,OT- Home , SLP-  Projected Follow-up: PT-Home health PT, OT-  Home health OT, SLP-  Projected Equipment Needs: PT-Standard walker, To be determined, OT- 3 in 1 bedside comode, Tub/shower seat, SLP-  Equipment Details: PT- , OT-  Patient/family involved in discharge planning: PT- Patient,  OT-Patient, SLP-   MD ELOS: 13-16 days Medical Rehab Prognosis:   Excellent Assessment: The patient has been admitted for CIR therapies with the diagnosis of left  subcortical infarct with right hemiparesis. The team will be addressing functional mobility, strength, stamina, balance, safety, adaptive techniques and equipment, self-care, bowel and bladder mgt, patient and caregiver education, NMR, visual-spatial awareness, community reentry. Goals have been set at mod I with basic ADL's, transfers, and locomotion.Ranelle Oyster, MD, FAAPMR      See Team Conference Notes for weekly updates to the plan of care

## 2018-10-28 NOTE — Progress Notes (Signed)
Social Work  Social Work Assessment and Plan  Patient Details  Name: David Mosley MRN: [id number] Date of Birth: 17-Jul-1956  Today's Date: 10/28/2018  Problem List:  Patient Active Problem List   Diagnosis Date Noted  . Embolic stroke (HCC) 10/25/2018  . CVA (cerebral vascular accident) (HCC) 10/23/2018  . Wide-complex tachycardia (HCC) 03/28/2018  . Hypothyroidism 03/28/2018  . Elevated AST (SGOT) 03/28/2018  . Chest pain 03/28/2018  . Tobacco use 03/28/2018  . Sleep apnea 03/28/2018  . Paroxysmal ventricular tachycardia (HCC)   . PSVT (paroxysmal supraventricular tachycardia) (HCC) 03/26/2018  . Essential hypertension, benign 05/29/2016  . Thyroid activity decreased 03/21/2016  . Edema 03/21/2016  . PVD (peripheral vascular disease) (HCC) 03/21/2016  . Cigarette nicotine dependence with nicotine-induced disorder 03/21/2016   Past Medical History:  Past Medical History:  Diagnosis Date  . Hypertension   . Hypothyroidism   . Meningitis   . Pneumonia   . PSVT (paroxysmal supraventricular tachycardia) (HCC)   . PVD (peripheral vascular disease) (HCC)   . Tobacco use    Past Surgical History:  Past Surgical History:  Procedure Laterality Date  . NO PAST SURGERIES     Social History:  reports that he has been smoking cigarettes. He has a 9.75 pack-year smoking history. He has never used smokeless tobacco. He reports that he does not drink alcohol or use drugs.  Family / Support Systems Marital Status: Divorced How Long?: >20 yrs Patient Roles: Parent(ex-wife, sister, 2 sons, 1 daughter) Spouse/Significant Other: ex-wife, Stanton Kidney @ (C) [phone] Other Supports: sister, Kara Mead "Lucendia Herrlich" Baughn  ([place]) @ (C) [phone] Anticipated Caregiver: Self Ability/Limitations of Caregiver: Patient does not want to reach out to others.  Encouraged him to ask for help at discharge from family/friends Caregiver Availability: Intermittent Family Dynamics: ex-wife in the room during my  first contact with pt and they both explain that they are very supportive of one another, "...but don't do too good living together..."  She was very encouraging to pt.  Pt notes his sister is also a good support.  Social History Preferred language: English Religion: Baptist Read: No Write: No Employment Status: Employed IT sales professional: works at a [employer] of Employment: 2(yrs) Return to Work Plans: Pt very concerned about able to safely perform his very physical job.  Considering applying for SSD. Legal Hisotry/Current Legal Issues: None Guardian/Conservator: None - per MD, pt is capable of making decisions on his own behalf.   Abuse/Neglect Abuse/Neglect Assessment Can Be Completed: Yes Physical Abuse: Denies Verbal Abuse: Denies Sexual Abuse: Denies Exploitation of patient/patient's resources: Denies Self-Neglect: Denies  Emotional Status Pt's affect, behavior adn adjustment status: Pt very talkative and occasionally needed cues to slow down and allow me to conduct my interview.  He is very focused on wanting to get home and be independent again.  He tries to show the return he is having in arm function.  Jokes easily with myself and his ex-wife.  He denies any significant emotional distress, however, will monitor and refer for neuropsychology as indicated. Recent Psychosocial Issues: None Pyschiatric History: None Substance Abuse History: None  Patient / Family Perceptions, Expectations & Goals Pt/Family understanding of illness & functional limitations: Pt and family with good, basic understanding of his stroke and resulting deficits.  Good understanding of CIR purpose. Premorbid pt/family roles/activities: Pt was living alone, independent and working f/t Anticipated changes in roles/activities/participation: If pt able to reach targeted mod ind goals, then little change anticipated. Pt/family expectations/goals: "I want to  be able to do for myself.  I don't like  having to rely on other people."  [employer]: None Premorbid Home Care/DME Agencies: None Transportation available at discharge: yes Resource referrals recommended: Neuropsychology, Support group (specify)  Discharge Planning Living Arrangements: Alone Support Systems: Children, Other relatives, Friends/neighbors Type of Residence: Private residence Insurance Resources: Customer service manager Resources: Employment Financial Screen Referred: Previously completed Living Expenses: Psychologist, sport and exercise Management: Patient Does the patient have any problems obtaining your medications?: No([facility] for primary medical care and medication assist) Home Management: Pt Patient/Family Preliminary Plans: Pt fully intends to d/c home alone with intermittent assist of sister, ex-wife. Social Work Anticipated Follow Up Needs: HH/OP Expected length of stay: 10-14 days  Clinical Impression Pleasant gentleman here following a CVA and making good progress toward mod ind goals overall. He lives alone but does have intermittent support available via ex-wife and his sister.  He denies any significant emotional distress. Does have financial concerns due to being unable to work.  We have discussed potential community resources he might access for assistance.  Will follow for support and further d/c planning needs.  Chella Chapdelaine 10/28/2018, 2:31 PM

## 2018-10-28 NOTE — Progress Notes (Signed)
Patient reported Itching to Back. Upon assessment reddened flat rash observed to center of Back. Patient medicated with PRN Benadryl po 25 mg. PA informed and Verbal Orders received to continue with PRN Benadryl for Itching/Rash to middle of Back.

## 2018-10-28 NOTE — Progress Notes (Signed)
Physical Therapy Session Note  Patient Details  Name: David Mosley MRN: [id number] Date of Birth: 1956-01-16  Today's Date: 10/28/2018 PT Individual Time: 1100-1200 PT Individual Time Calculation (min): 60 min   Short Term Goals: Week 1:  PT Short Term Goal 1 (Week 1): Pt will ambulate 150' min assist with AD PT Short Term Goal 2 (Week 1): Pt will ambulate up and down 2 stairs using 2 handrails with min assist PT Short Term Goal 3 (Week 1): Pt will perform stand pivot transfer min assist with AD  Skilled Therapeutic Interventions/Progress Updates:    no c/o pain.  Session focus on gait training and NMR.    Pt transfers sit<>stand throughout session with and without RW with min guard.  Gait within room to bathroom with min assist, standing balance for (+) void with supervision.  Gait training with RW focus on RLE clearance and L weight shift throughout session (150' +100' +150') with min assist for balance.  Pt wanting to attempt gait without RW, trial of alternating toe taps to 4" steps with mod/max assist for balance 2/2 R knee pain during stance phase.  Provided ace wrap for increased support with little positive effect.  Standing balance without UE support and min assist for horse shoe task reaching across midline and down to low stool.  Gait back to room at end of session, R shoe cover for improved swing through, with min assist and verbal cues for pacing and walker proximity.  Pt positioned in recliner with call bell in reach and needs met.   Therapy Documentation Precautions:  Precautions Precautions: Fall Precaution Comments: Permissive HTN 220/110 per neurology consult note Restrictions Weight Bearing Restrictions: No    Therapy/Group: Individual Therapy  Michel Santee 10/28/2018, 11:46 AM

## 2018-10-29 ENCOUNTER — Inpatient Hospital Stay (HOSPITAL_COMMUNITY): Payer: Self-pay | Admitting: Physical Therapy

## 2018-10-29 ENCOUNTER — Inpatient Hospital Stay (HOSPITAL_COMMUNITY): Payer: Self-pay | Admitting: Occupational Therapy

## 2018-10-29 NOTE — Progress Notes (Signed)
Occupational Therapy Session Note  Patient Details  Name: David Mosley MRN: [id number] Date of Birth: Oct 02, 1956  Today's Date: 10/29/2018 OT Individual Time: 0700-0810 and 1250-1400 OT Individual Time Calculation (min): 70 min and 70 min    Short Term Goals: Week 1:  OT Short Term Goal 1 (Week 1): Pt will complete toilet transer wiht min A OT Short Term Goal 2 (Week 1): Pt will advance pants past hips wiht A for balance only to demo improved use of RUE functionally OT Short Term Goal 3 (Week 1): Pt will don B socks with supervision/VC PRN OT Short Term Goal 4 (Week 1): Pt will complete oral care with RUE and no foam handle  Skilled Therapeutic Interventions/Progress Updates:    Session 1: Upon entering the room, pt seated in wheelchair awaiting OT arrival. No c/o pain this session. Pt ambulating with RW 15' into bathroom with close supervision for safety. Pt seated on toilet for BM and able to manage clothing, transfer, and perform hygiene while seated for safety with overall supervision. Ambulating with RW onto TTB with RW and close supervision. Pt utilized R UE to wash 75% of body parts this session without cuing to do so. Pt utilized figure four position to wash B LEs and feet. Pt exiting shower and seated on commode chair to don clothing items with hemiplegic technique. Pt standing for LB clothing management with supervision and able to pull clothing over B hips with B hands involved. Pt returning to sit in recliner chair and donning B socks and shoes without issue. Pt seated in recliner chair with call bell and all needed items within reach upon exiting the room.    Session 2: Upon entering the room, pt seated in recliner chair with no c/o pain and agreeable to OT intervention. Pt ambulating with RW to laundry room with supervision. OT providing education for safety to obtain items from dryer. Pt able to do mini squat to obtain items from back of dryer with overall supervision. Pt standing  without UE support to fold laundry and place into back. Pt ambulating back to room to place items into closet. Transfer onto toilet with supervision and use of RW for urination and hygiene while standing at sink with overall supervision as well. Pt engaged in dynamic balance activity with use of dynavision board. Pt standing for 4 minutes without UE support and use only of R UE. Pt able to hit 136 targets with reaction time of 1.76 seconds. Pt's time did slow down as he fatigued. Pt taking seated rest break and then standing on 4 inch airex cushion for 2 minutes with 1 forwards LOB that pt was able to self correct. Pt decreased reaction time with increased balance challenge. Pt engaged in B UE strengthening and coordination task while holding onto exercise ball and controlling movements for forwards rows, chest press, and shoulder elevation exercise with rest breaks secondary to muscle fatigue. Pt returning to room in same manner at end of session and seated in recliner chair. Call bell and all needed items within reach upon exiting the room.   Therapy Documentation Precautions:  Precautions Precautions: Fall Precaution Comments: Permissive HTN 220/110 per neurology consult note Restrictions Weight Bearing Restrictions: No ADL: ADL Eating: Set up Grooming: Supervision/safety Upper Body Bathing: Minimal assistance Where Assessed-Upper Body Bathing: Shower Lower Body Bathing: Minimal assistance Where Assessed-Lower Body Bathing: Shower Upper Body Dressing: Supervision/safety Where Assessed-Upper Body Dressing: Sitting at sink Lower Body Dressing: Minimal assistance Where Assessed-Lower Body Dressing: Wheelchair,  Sitting at sink Toileting: Moderate assistance Where Assessed-Toileting: Teacher, adult educationToilet Toilet Transfer: Moderate assistance Toilet Transfer Equipment: Psychiatric nurseBedside commode Walk-In Shower Transfer: Moderate assistance Film/video editorWalk-In Shower Transfer Method: Warden/rangertand pivot Walk-In Shower Equipment: Designer, fashion/clothingTransfer  tub bench   Therapy/Group: Individual Therapy  Alen BleacherBradsher, David Toto P 10/29/2018, 8:12 AM

## 2018-10-29 NOTE — Progress Notes (Signed)
Physical Therapy Session Note  Patient Details  Name: David Mosley MRN: [id number] Date of Birth: August 08, 1956  Today's Date: 10/29/2018 PT Individual Time: 1100-1200 PT Individual Time Calculation (min): 60 min   Short Term Goals: Week 1:  PT Short Term Goal 1 (Week 1): Pt will ambulate 150' min assist with AD PT Short Term Goal 2 (Week 1): Pt will ambulate up and down 2 stairs using 2 handrails with min assist PT Short Term Goal 3 (Week 1): Pt will perform stand pivot transfer min assist with AD  Skilled Therapeutic Interventions/Progress Updates:    no c/o pain.  Session focus on gait with RW, stair negotiation, and NMR for R knee control in stance phase.    Pt transfers sit<>stand throughout session with RW and supervision.  Gait to and from therapy session with RW and supervision, min cues for R foot clearance and short standing rest breaks when needed for RLE fatigue.  High level gait through obstacle course focus on navigating around/over obstacles with RW, initially min verbal cues for walker proximity and stepping over obstacles, progress to supervision only for safety.  Stair negotiation x4 steps with 2 rails, forward ascent/backwards descent with supervision and verbal cues for sequencing.  Stair negotiation x4 steps +12 steps with 2 rails, min cues for ascent with stronger LLE and descent with weaker RLE.  Pt continues to demo some decreased awareness of deficits and decreased anticipatory awareness throughout session.  Bilateral bridges with adductor hold 2x10 reps and RLE only bridges 2x8 reps for RLE NMR and strengthening.  Returned to room at end of session and positioned upright in recliner with call bell in reach and needs met.   Therapy Documentation Precautions:  Precautions Precautions: Fall Precaution Comments: Permissive HTN 220/110 per neurology consult note Restrictions Weight Bearing Restrictions: No    Therapy/Group: Individual Therapy  Michel Santee 10/29/2018, 12:03 PM

## 2018-10-29 NOTE — Progress Notes (Signed)
[facility] PHYSICAL MEDICINE & REHABILITATION PROGRESS NOTE   Subjective/Complaints:  Ongoing improvement. Up with OT in shower. Pleased with progress  ROS: Patient denies fever, rash, sore throat, blurred vision, nausea, vomiting, diarrhea, cough, shortness of breath or chest pain, joint or back pain, headache, or mood change.    Objective:   No results found. No results for input(s): WBC, HGB, HCT, PLT in the last 72 hours. No results for input(s): NA, K, CL, CO2, GLUCOSE, BUN, CREATININE, CALCIUM in the last 72 hours.  Intake/Output Summary (Last 24 hours) at 10/29/2018 0905 Last data filed at 10/29/2018 0410 Gross per 24 hour  Intake 720 ml  Output 950 ml  Net -230 ml     Physical Exam: Vital Signs Blood pressure (!) 160/88, pulse 72, temperature 98.1 F (36.7 C), temperature source Oral, resp. rate 18, height 5\' 8"  (1.727 m), weight 93.4 kg, SpO2 95 %.   Constitutional: No distress . Vital signs reviewed. HEENT: EOMI, oral membranes moist Neck: supple Cardiovascular: RRR without murmur. No JVD    Respiratory: CTA Bilaterally without wheezes or rales. Normal effort    GI: BS +, non-tender, non-distended  Neurologic: Cranial nerves II through XII intact, motor strength is 5/5 in left deltoid, bicep, tricep, grip, hip flexor, knee extensors, ankle dorsiflexor and plantar flexor,   5/5 on the right Sensory exam normal sensation to light touch and proprioception in bilateral upper and lower extremities. Improved coordination RUE and RLE.    Musculoskeletal: Full range of motion in all 4 extremities. No joint swelling. Right knee tender  Assessment/Plan: 1. 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  Physiatrist is providing close team supervision and 24 hour management of active medical problems listed  below.  Physiatrist and rehab team continue to assess barriers to discharge/monitor patient progress toward functional and medical goals  Care Tool:  Bathing    Body parts bathed by patient: Right arm, Left arm, Chest, Abdomen, Front perineal area, Buttocks, Right upper leg, Left upper leg, Left lower leg, Right lower leg, Face         Bathing assist Assist Level: Supervision/Verbal cueing     Upper Body Dressing/Undressing Upper body dressing   What is the patient wearing?: Pull over shirt    Upper body assist Assist Level: Supervision/Verbal cueing    Lower Body Dressing/Undressing Lower body dressing      What is the patient wearing?: Underwear/pull up, Pants     Lower body assist Assist for lower body dressing: Supervision/Verbal cueing     Toileting Toileting    Toileting assist Assist for toileting: Supervision/Verbal cueing     Transfers Chair/bed transfer  Transfers assist     Chair/bed transfer assist level: Supervision/Verbal cueing     Locomotion Ambulation   Ambulation assist      Assist level: Supervision/Verbal cueing Assistive device: Walker-rolling Max distance: 40'   Walk 10 feet activity   Assist     Assist level: Supervision/Verbal cueing Assistive device: Walker-rolling   Walk 50 feet activity   Assist    Assist level: Minimal Assistance - Patient > 75% Assistive device: Walker-rolling    Walk 150 feet activity   Assist    Assist level: Minimal Assistance - Patient > 75% Assistive device: Walker-rolling    Walk 10 feet on uneven surface  activity   Assist  Wheelchair     Assist Will patient use wheelchair at discharge?: No Type of Wheelchair: Manual    Wheelchair assist level: Minimal Assistance - Patient > 75% Max wheelchair distance: 100    Wheelchair 50 feet with 2 turns activity    Assist        Assist Level: Minimal Assistance - Patient > 75%   Wheelchair 150 feet  activity     Assist Wheelchair 150 feet activity did not occur: Safety/medical concerns         Medical Problem List and Plan: 1.Functional deficitssecondary toLeft internal capsule/basal ganglia stroke. --Continue CIR therapies including PT, OT, and SLP   --Interdisciplinary Team Conference today   2. DVT Prophylaxis/Anticoagulation: Pharmaceutical:Lovenox 3. Pain Management:Tylenol and ice as needed for right knee pain. Add baclofen as needed for spasms.  -  hinged neoprene brace for right knee 4. Mood:LCSW to follow for evaluation and support. 5. Neuropsych: This patientiscapable of making decisions on own behalf. 6. Skin/Wound Care:Routine pressure relief measures. Maintain adequate nutrition and hydration status. 7. Fluids/Electrolytes/Nutrition:Monitor I's and O's. -excellent PO intake 8.Hypertension:Monitor blood pressures twice daily. Continue Cardizem and Metoprolol. Vitals:   10/29/18 0401 10/29/18 0820  BP: (!) 160/88   Pulse: (!) 58 72  Resp: 18   Temp: 98.1 F (36.7 C)   SpO2: 95%   Blood pressure borderline 11/12--no changes today, permissive 9. History of PSVT: Monitorheart rate twice daily basis. Continue Cardizem, Metoprolol and Tambocor 10.Tobacco abuse:  continue nicotine patch. 11. Dyslipidemia: on Lipitor. 12. Constipation: Start Miralax.  Dulcolax suppository every 3 days as needed no BM    LOS: 4 days A FACE TO FACE EVALUATION WAS PERFORMED  Ranelle Oyster 10/29/2018, 9:05 AM

## 2018-10-30 ENCOUNTER — Inpatient Hospital Stay (HOSPITAL_COMMUNITY): Payer: Self-pay | Admitting: Occupational Therapy

## 2018-10-30 ENCOUNTER — Ambulatory Visit (HOSPITAL_COMMUNITY): Payer: Self-pay | Admitting: *Deleted

## 2018-10-30 ENCOUNTER — Inpatient Hospital Stay (HOSPITAL_COMMUNITY): Payer: Self-pay | Admitting: Physical Therapy

## 2018-10-30 LAB — CBC
HCT: 46.2 % (ref 39.0–52.0)
Hemoglobin: 14.8 g/dL (ref 13.0–17.0)
MCH: 29.2 pg (ref 26.0–34.0)
MCHC: 32 g/dL (ref 30.0–36.0)
MCV: 91.3 fL (ref 80.0–100.0)
NRBC: 0 % (ref 0.0–0.2)
PLATELETS: 176 10*3/uL (ref 150–400)
RBC: 5.06 MIL/uL (ref 4.22–5.81)
RDW: 13.2 % (ref 11.5–15.5)
WBC: 9 10*3/uL (ref 4.0–10.5)

## 2018-10-30 MED ORDER — CAMPHOR-MENTHOL 0.5-0.5 % EX LOTN
TOPICAL_LOTION | Freq: Three times a day (TID) | CUTANEOUS | Status: DC
  Administered 2018-10-30 – 2018-11-01 (×8): via TOPICAL
  Filled 2018-10-30: qty 222

## 2018-10-30 MED ORDER — ACETAMINOPHEN 325 MG PO TABS: 325.0000 mg | ORAL_TABLET | ORAL | Status: DC | PRN

## 2018-10-30 NOTE — Progress Notes (Signed)
Occupational Therapy Session Note  Patient Details  Name: David Mosley MRN: [id number] Date of Birth: 07/05/1956  Today's Date: 10/30/2018 OT Individual Time: [id number] and 1415-1500 OT Individual Time Calculation (min): 73 min and 45 min    Short Term Goals: Week 1:  OT Short Term Goal 1 (Week 1): Pt will complete toilet transer wiht min A OT Short Term Goal 2 (Week 1): Pt will advance pants past hips wiht A for balance only to demo improved use of RUE functionally OT Short Term Goal 3 (Week 1): Pt will don B socks with supervision/VC PRN OT Short Term Goal 4 (Week 1): Pt will complete oral care with RUE and no foam handle  Skilled Therapeutic Interventions/Progress Updates:    Session 1:  Upon entering the room, pt seated in recliner chair awaiting OT with no c/o pain this session. Pt ambulating with RW and supervision to closet to obtain clothing items. Min squat needed to pick up clothing items from floor with close supervision but no LOB. Pt needing additional cuing for R LE for safety secondary to no brace or shoes on since going to shower. Pt performed toileting needs with  overall supervision. Pt doffing clothing while seated on toilet for safety and then transferring onto TTB with supervision and use of grab bar with RW. Pt bathing from seated position with lateral leans to wash buttocks and figure four position to wash B LEs and feet at supervision level. Pt donning clothing items with sit >stand from commode chair to don LB clothing. Pt standing at sink for grooming tasks at supervision level. Pt utilizing R UE at nondominant level without cuing to utilize. Pt returning to recliner chair at end of session with call bell and all needed items within reach.   Session 2: Upon entering the room, pt seated in recliner chair. Pt ambulating with with RW into bathroom for toileting with supervision. Then carrying clothing items to laundry room and placing in washing machine with supervision for  safety. Pt seated in wheelchair and assisted downstairs to gift shop for time management. Focus on community mobility skills with use of RW. Pt able to navigate store, aisles, and obtain items from shelves with overall supervision. OT assisted pt back to room at end of session. Call bell and all needed items within reach upon exiting the room.   Therapy Documentation Precautions:  Precautions Precautions: Fall Precaution Comments: Permissive HTN 220/110 per neurology consult note Restrictions Weight Bearing Restrictions: No Vital Signs: Therapy Vitals Pulse Rate: 66 Resp: 20 BP: 136/80 Patient Position (if appropriate): Sitting Oxygen Therapy SpO2: 98 % O2 Device: Room Air ADL: ADL Eating: Set up Grooming: Supervision/safety Upper Body Bathing: Minimal assistance Where Assessed-Upper Body Bathing: Shower Lower Body Bathing: Minimal assistance Where Assessed-Lower Body Bathing: Shower Upper Body Dressing: Supervision/safety Where Assessed-Upper Body Dressing: Sitting at sink Lower Body Dressing: Minimal assistance Where Assessed-Lower Body Dressing: Wheelchair, Sitting at sink Toileting: Moderate assistance Where Assessed-Toileting: Teacher, adult educationToilet Toilet Transfer: Moderate assistance Toilet Transfer Equipment: Psychiatric nurseBedside commode Walk-In Shower Transfer: Moderate assistance Film/video editorWalk-In Shower Transfer Method: Warden/rangertand pivot Walk-In Shower Equipment: Emergency planning/management officerTransfer tub bench   Therapy/Group: Individual Therapy  Alen BleacherBradsher, Dorris Pierre P 10/30/2018, 9:16 AM

## 2018-10-30 NOTE — Patient Care Conference (Signed)
Inpatient RehabilitationTeam Conference and Plan of Care Update Date: 10/29/2018   Time: 2:15 PM    Patient Name: David Mosley      Medical Record Number: [id number]  Date of Birth: 03/10/1956 Sex: Male         Room/Bed: 4W14C/4W14C-01 Payor Info: Payor: MEDICAID POTENTIAL / Plan: MEDICAID POTENTIAL / Product Type: *No Product type* /    Admitting Diagnosis: R CVA  Admit Date/Time:  10/25/2018  5:33 PM Admission Comments: No comment available   Primary Diagnosis:  <principal problem not specified> Principal Problem: <principal problem not specified>  Patient Active Problem List   Diagnosis Date Noted  . Embolic stroke (HCC) 10/25/2018  . CVA (cerebral vascular accident) (HCC) 10/23/2018  . Wide-complex tachycardia (HCC) 03/28/2018  . Hypothyroidism 03/28/2018  . Elevated AST (SGOT) 03/28/2018  . Chest pain 03/28/2018  . Tobacco use 03/28/2018  . Sleep apnea 03/28/2018  . Paroxysmal ventricular tachycardia (HCC)   . PSVT (paroxysmal supraventricular tachycardia) (HCC) 03/26/2018  . Essential hypertension, benign 05/29/2016  . Thyroid activity decreased 03/21/2016  . Edema 03/21/2016  . PVD (peripheral vascular disease) (HCC) 03/21/2016  . Cigarette nicotine dependence with nicotine-induced disorder 03/21/2016    Expected Discharge Date: Expected Discharge Date: 11/02/18  Team Members Present: Physician leading conference: Dr. Faith RogueZachary Swartz Social Worker Present: Amada JupiterLucy Maksim Peregoy, LCSW Nurse Present: Other (comment)(Cristal Willeen CassBennett, RN) PT Present: Teodoro Kilaitlin Penven-Crew, Grayland OrmondPT;Alison Gray, PT OT Present: Callie FieldingKatie Pittman, OT SLP Present: Colin BentonMadison Cratch, SLP PPS Coordinator present : Edson SnowballBecky Windsor, PT     Current Status/Progress Goal Weekly Team Focus  Medical   left internal capsule, basal ganglia infarct, with right hemiparesis. HTN,  improve bp, increase functional use of right side  bp control. nutrition   Bowel/Bladder   continent of bowel and bladdr, LBM 10-25-18  continent of  bowel and bladder, maintain regular bowel pattern   Assist patient with toileting needs prn   Swallow/Nutrition/ Hydration             ADL's   min A overall with use of RW  mod I overall, S for shower transfer and bathing  R NMR, self care retraining, balance, functional transfers, pt/family education   Mobility   min guard/supervision, mildly impulsive  mod I at home during the day  safety awareness, balance, functional mobility progression, d/c education/planning   Communication             Safety/Cognition/ Behavioral Observations            Pain   no c/o pain   no pain   Assess pain q shift and prn   Skin              Rehab Goals Patient on target to meet rehab goals: Yes *See Care Plan and progress notes for long and short-term goals.     Barriers to Discharge  Current Status/Progress Possible Resolutions Date Resolved   Physician    Medical stability               Nursing                  PT  Decreased caregiver support;Home environment access/layout;Insurance for SNF coverage;Lack of/limited family support  Pt has 2 steps to enter home, lives alone, and will likely not have 24/7 caregiver support              OT Lack of/limited family support;Decreased caregiver support  SLP                SW                Discharge Planning/Teaching Needs:  Plans to return home with intermittent support of ex-wife and family.  NA with mod ind goals.   Team Discussion:  Doing well medically.  Supervision with ADLs, ambulation.  UE making good recovery.  Still needs cues when fatigued as he is more careless with movelemtsn.  Continue to encourage safety awareness.  Goals at mod ind overall.    Revisions to Treatment Plan:  NA    Continued Need for Acute Rehabilitation Level of Care: The patient requires daily medical management by a physician with specialized training in physical medicine and rehabilitation for the following conditions: Daily direction of a  multidisciplinary physical rehabilitation program to ensure safe treatment while eliciting the highest outcome that is of practical value to the patient.: Yes Daily medical management of patient stability for increased activity during participation in an intensive rehabilitation regime.: Yes Daily analysis of laboratory values and/or radiology reports with any subsequent need for medication adjustment of medical intervention for : Neurological problems;Blood pressure problems   I attest that I was present, lead the team conference, and concur with the assessment and plan of the team.   Lane Eland 10/30/2018, 11:24 AM

## 2018-10-30 NOTE — Progress Notes (Signed)
Notified Mannie, ortho r/t Rt AFO brace. He acknowledge order and will visit pt soon.

## 2018-10-30 NOTE — Progress Notes (Signed)
Physical Therapy Session Note  Patient Details  Name: David Mosley MRN: [id number] Date of Birth: 08/15/1956  Today's Date: 10/30/2018 PT Individual Time: 0900-1000 PT Individual Time Calculation (min): 60 min   Short Term Goals: Week 1:  PT Short Term Goal 1 (Week 1): Pt will ambulate 150' min assist with AD PT Short Term Goal 2 (Week 1): Pt will ambulate up and down 2 stairs using 2 handrails with min assist PT Short Term Goal 3 (Week 1): Pt will perform stand pivot transfer min assist with AD  Skilled Therapeutic Interventions/Progress Updates:   Pt sitting up in recliner, with R knee brace already donned.   sit> stand from recliner with CG assist. Stand pivot transfer with RW, CGA, cues for position of RW and hands.  Gait training x 100' with RW on level tile, contact guard assist. Gait training with grocery cart x 50' with contact guard assist, cues for R foot cleaance as he fatigued or talked and became inattentive.  Co-treatment with Misty StanleyLisa, Therapeutic Recreation, for +2 dynamic balance activities:  Horseshoes in standing, reaching out of BOS to retrieve shoe and throw with R hand.  Consistent R bias when throwing toward target.  Large ball pass in sitting, bil UE use for overhead pass, chest pass.  (Pt does not read)  In ADL kitchen, side stepping with use of counter with close supervision.  Use of R had to retrieve and replace items in cabinets over head without spillage.  Pt stated that he liked to bake cakes and biscuits; he assessed for a cake pan, ingredients for the cake, and practiced opening/closing oven to insert a pan with 4 small items on it, without spillage. Some limitations in judging presence of ingredients due to being a non-reader.   Pt left resting in recliner with needs at hand, reclined.     Therapy Documentation Precautions:  Precautions Precautions: Fall Precaution Comments: Permissive HTN 220/110 per neurology consult note Restrictions Weight Bearing  Restrictions: No  Pain: pt denies       Therapy/Group: Individual Therapy  Sacheen Arrasmith 10/30/2018, 10:46 AM

## 2018-10-30 NOTE — Progress Notes (Signed)
[facility] PHYSICAL MEDICINE & REHABILITATION PROGRESS NOTE   Subjective/Complaints:  Pleased with progress. 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 "I have nothing saved"  ROS: Patient denies fever, rash, sore throat, blurred vision, nausea, vomiting, diarrhea, cough, shortness of breath or chest pain, joint or back pain, headache, or mood change.   Objective:   No results found. Recent Labs    10/30/18 0446  WBC 9.0  HGB 14.8  HCT 46.2  PLT 176   No results for input(s): NA, K, CL, CO2, GLUCOSE, BUN, CREATININE, CALCIUM in the last 72 hours.  Intake/Output Summary (Last 24 hours) at 10/30/2018 0911 Last data filed at 10/30/2018 0830 Gross per 24 hour  Intake 960 ml  Output 1100 ml  Net -140 ml     Physical Exam: Vital Signs Blood pressure 136/80, pulse 66, temperature 98 F (36.7 C), temperature source Oral, resp. rate 20, height 5\' 8"  (1.727 m), weight 93 kg, SpO2 98 %.   Constitutional: No distress . Vital signs reviewed. HEENT: EOMI, oral membranes moist Neck: supple Cardiovascular: RRR without murmur. No JVD    Respiratory: CTA Bilaterally without wheezes or rales. Normal effort    GI: BS +, non-tender, non-distended  Neurologic: Cranial nerves II through XII intact, motor strength is 5/5 in left deltoid, bicep, tricep, grip, hip flexor, knee extensors, ankle dorsiflexor and plantar flexor,   5/5 on the right. RUE 4+/5. RLE 4/5 with pain inhibition at right knee. Sensory exam normal sensation to light touch and proprioception in bilateral upper and lower extremities. Improved coordination RUE and RLE.    Musculoskeletal: Full range of motion in all 4 extremities. No joint swelling. Right knee tender  Assessment/Plan: 1. 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  Physiatrist is providing close team supervision and 24 hour management of active medical problems listed below.  Physiatrist and rehab team continue to assess barriers to discharge/monitor patient progress toward functional and medical goals  Care Tool:  Bathing    Body parts bathed by patient: Right arm, Left arm, Chest, Abdomen, Front perineal area, Buttocks, Right upper leg, Left upper leg, Left lower leg, Right lower leg, Face         Bathing assist Assist Level: Supervision/Verbal cueing     Upper Body Dressing/Undressing Upper body dressing   What is the patient wearing?: Pull over shirt    Upper body assist Assist Level: Supervision/Verbal cueing    Lower Body Dressing/Undressing Lower body dressing      What is the patient wearing?: Underwear/pull up, Pants     Lower body assist Assist for lower body dressing: Supervision/Verbal cueing     Toileting Toileting    Toileting assist Assist for toileting: Supervision/Verbal cueing     Transfers Chair/bed transfer  Transfers assist     Chair/bed transfer assist level: Supervision/Verbal cueing     Locomotion Ambulation   Ambulation assist      Assist level: Supervision/Verbal cueing Assistive device: Walker-rolling Max distance: 150   Walk 10 feet activity   Assist     Assist level: Supervision/Verbal cueing Assistive device: Walker-rolling   Walk 50 feet activity   Assist    Assist level: Supervision/Verbal cueing Assistive device: Walker-rolling    Walk 150 feet activity   Assist    Assist level: Supervision/Verbal cueing Assistive device: Walker-rolling  Walk 10 feet on uneven surface  activity   Assist           Wheelchair     Assist Will patient use wheelchair at discharge?: No Type of Wheelchair: Manual    Wheelchair assist level: Minimal Assistance - Patient > 75% Max wheelchair distance: 100    Wheelchair 50 feet with 2 turns  activity    Assist        Assist Level: Minimal Assistance - Patient > 75%   Wheelchair 150 feet activity     Assist Wheelchair 150 feet activity did not occur: Safety/medical concerns         Medical Problem List and Plan: 1.Functional deficitssecondary toLeft internal capsule/basal ganglia stroke. --Continue CIR therapies including PT, OT, and SLP   -discussed with pt re: vocational issues. While it's far from a "given", he still has potential to return to his prior work at some point if he continues to display the progress he has shown thus far. He states that he had planned to work 4 more years. The work he does is quite physical, and he was already having right knee problems prior to the stroke. We will discuss more as an outpt. Any return to work would require at least a few months of rehab to get to a point where he could tolerate the physical level of activity required.   2. DVT Prophylaxis/Anticoagulation: Pharmaceutical:Lovenox 3. Pain Management:Tylenol and ice as needed for right knee pain. Add baclofen as needed for spasms.  -  hinged neoprene brace for right knee 4. Mood:LCSW to follow for evaluation and support. 5. Neuropsych: This patientiscapable of making decisions on own behalf. 6. Skin/Wound Care:Routine pressure relief measures. Maintain adequate nutrition and hydration status. 7. Fluids/Electrolytes/Nutrition:Monitor I's and O's. -excellent PO intake 8.Hypertension:Monitor blood pressures twice daily. Continue Cardizem and Metoprolol. Vitals:   10/30/18 0500 10/30/18 0825  BP: (!) 143/83 136/80  Pulse: (!) 59 66  Resp: 18 20  Temp: 98 F (36.7 C)   SpO2: 99% 98%  Blood pressure ok 11/13 9. History of PSVT: Monitorheart rate twice daily basis. Continue Cardizem, Metoprolol and Tambocor 10.Tobacco abuse:  continue nicotine patch. 11. Dyslipidemia: on Lipitor. 12. Constipation: Start Miralax.  Dulcolax suppository  every 3 days as needed no BM    LOS: 5 days A FACE TO FACE EVALUATION WAS PERFORMED  Ranelle Oyster 10/30/2018, 9:11 AM

## 2018-10-30 NOTE — Evaluation (Addendum)
Recreational Therapy Assessment and Plan  Patient Details  Name: Dontravious Camille MRN: [id number] Date of Birth: 1956/06/02 Today's Date: 10/30/2018 Time:  [phone] Pain:  No c/o Rehab Potential: Good ELOS: 7 days  Assessment Problem List:      Patient Active Problem List   Diagnosis Date Noted  . Embolic stroke ([place]) 28/31/5176  . CVA (cerebral vascular accident) ([place]) 10/23/2018  . Wide-complex tachycardia ([place]) 03/28/2018  . Hypothyroidism 03/28/2018  . Elevated AST (SGOT) 03/28/2018  . Chest pain 03/28/2018  . Tobacco use 03/28/2018  . Sleep apnea 03/28/2018  . Paroxysmal ventricular tachycardia ([place])   . PSVT (paroxysmal supraventricular tachycardia) ([place]) 03/26/2018  . Essential hypertension, benign 05/29/2016  . Thyroid activity decreased 03/21/2016  . Edema 03/21/2016  . PVD (peripheral vascular disease) ([place]) 03/21/2016  . Cigarette nicotine dependence with nicotine-induced disorder 03/21/2016    Past Medical History:      Past Medical History:  Diagnosis Date  . Hypertension   . Hypothyroidism   . Meningitis   . Pneumonia   . PSVT (paroxysmal supraventricular tachycardia) ([place])   . PVD (peripheral vascular disease) ([place])   . Tobacco use    Past Surgical History:       Past Surgical History:  Procedure Laterality Date  . NO PAST SURGERIES      Assessment & Plan Clinical Impression: Takai Chiaramonte is a 62 year old male with history ofHTN, PSVT, meningitis, tobacco abuse; who was admitted on 10/23/2018 with numbness and tingling right upper extremity and right lower extremity as well as weakness. UDS negative. CT perfusion head/neck done revealing no infarct or penumbra. CTA head/neck showed bulky soft plaque left ICA origin and bulb with high-grade left ICA stenosis approaching radiographic string sign otherwise negative for large vessel occlusion. TPA not given as patient now a window for treatment and was transferred to [facility] for  evaluation and treatment. Carotid Dopplers done revealing 40 to 59% left ICA stenosis. 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 Dr Rhodia Albright that stroke likely due to small versus large vessel disease--left ICA soft plaque. He recommended aspirin Plavix x3 weeks followed by aspirin alone. Patient with improvement in sensation on right side however continues to be limited by right-sided weakness, deficits and gait as well as ability to carry out ADLs. CIR recommended for follow-up therapy  Pt presents with decreased activity tolerance, decreased functional mobility, decreased balance, right inattention, right inattention Limiting pt's independence with leisure/community pursuits.  Plan Min 1 TR session >20 minutes during LOS  Recommendations for other services: None   Discharge Criteria: Patient will be discharged from TR if patient refuses treatment 3 consecutive times without medical reason.  If treatment goals not met, if there is a change in medical status, if patient makes no progress towards goals or if patient is discharged from hospital.  The above assessment, treatment plan, treatment alternatives and goals were discussed and mutually agreed upon: by patient  NOTES: *Pt reports that he does not read.  Pt seen during co-treat with PT with emphasis on dynamic standing balance, RUE use, kitchen safety/home management tasks.  Pt stood for horseshoe tasks reaching outside BOS with RUE with min assist.  Transitioned to walking with grocery cart simulating community pursuits wit min assist and instructional cues.  Pt Independently stated that he would use a motorized cart for safety & energy conservation.  Once in the rehab apartment, pt retrieved items from various  shelving and planned a meal prep activity to be scheduled tomorrow.  Pt performed task with supervision using countertop for UE support.   Pt practiced opening and closing the oven and inserting & removing items as pt stated he liked to make home-made biscuits and bread. Lillyan Hitson 10/30/2018, 12:52 PM

## 2018-10-30 NOTE — Progress Notes (Signed)
Physical Therapy Session Note  Patient Details  Name: David Mosley MRN: [id number] Date of Birth: 12/22/1955  Today's Date: 10/30/2018 PT Individual Time: 1600-1630 PT Individual Time Calculation (min): 30 min   Short Term Goals: Week 1:  PT Short Term Goal 1 (Week 1): Pt will ambulate 150' min assist with AD PT Short Term Goal 2 (Week 1): Pt will ambulate up and down 2 stairs using 2 handrails with min assist PT Short Term Goal 3 (Week 1): Pt will perform stand pivot transfer min assist with AD  Skilled Therapeutic Interventions/Progress Updates:    Pt received sitting in WC and agreeable to PT. Gait through room to [facility] for urination with RW and supervision assist from PT. PT noted consistent foot drop on the RLE with only small correction with cues from PT. Pt unable to carry over heel contact more than 3 steps. Pt transported to rehab gym in [facility].  PT isntructed pt in gait training 2x 179f with RW and PLS RAFO. Significant improvement in knee stability and foot clearance on the R with AFO compared to no AFO. PT also instructed pt in gait training with RW and RAFO x 1560fthrough hall and supervision assist. Min cues for adequate knee flexion to clear RLE when fatigue. [facility] and [facility] and all needs met.           Therapy Documentation Precautions:  Precautions Precautions: Fall Precaution Comments: Permissive HTN 220/110 per neurology consult note Restrictions Weight Bearing Restrictions: No Pain: denies   Therapy/Group: Individual Therapy  AuLorie Phenix1/13/2019, 5:34 PM

## 2018-10-30 NOTE — Plan of Care (Signed)
  Problem: RH SAFETY Goal: RH STG ADHERE TO SAFETY PRECAUTIONS W/ASSISTANCE/DEVICE Description STG Adhere to Safety Precautions With Mod I Assistance/Device.  Outcome: Progressing  Call light in reach, bed/chair alarm proper footwear

## 2018-10-30 NOTE — Discharge Summary (Signed)
Physician Discharge Summary  Patient ID: David Mosley MRN: [id number] DOB/AGE: 1956-10-11 62 y.o.  Admit date: 10/25/2018 Discharge date: 11/02/2018  Discharge Diagnoses:  Principal Problem:   Embolic stroke [facility]) Active Problems:   Essential hypertension, benign   PSVT (paroxysmal supraventricular tachycardia) (H[facility])   AKI (acute kidney injury) (H[facility])   Hyponatremia   Essential hypertension   Discharged Condition: Stable   Significant Diagnostic Studies: N/A   Labs:  Basic Metabolic Panel: BMP Latest Ref Rng & Units 10/26/2018 10/25/2018 10/24/2018  Glucose 70 - 99 mg/dL 086(V) 93 784(O)  BUN 8 - 23 mg/dL 15 17 11   Creatinine 0.61 - 1.24 mg/dL 9.62 9.52 8.41  Sodium 135 - 145 mmol/L 136 136 137  Potassium 3.5 - 5.1 mmol/L 3.8 3.7 3.7  Chloride 98 - 111 mmol/L 107 107 109  CO2 22 - 32 mmol/L 23 23 22   Calcium 8.9 - 10.3 mg/dL 8.9 3.2(G) 4.0(N)    CBC: Recent Labs  Lab 10/24/18 0330 10/26/18 0604 10/30/18 0446  WBC 7.4 8.5 9.0  NEUTROABS  --  5.3  --   HGB 13.3 14.5 14.8  HCT 41.8 44.0 46.2  MCV 92.1 91.3 91.3  PLT 177 165 176    CBG: No results for input(s): GLUCAP in the last 168 hours.  Brief HPI:   David Mosley is a 62 year old male with history of HTN, PSVT, meningitis, tobacco abuse; who was admitted on 10/23/2018 with numbness and tingling of right upper extremity and right lower extremity as well as weakness.  CT perfusion head/neck revealed no infarct or penumbra.  CTA head/neck showed bulky soft plaque in left ICA origin and bulb with high-grade ICA stenosis with question of string sign.  Carotid Dopplers done revealing 40 to 50% left ICA stenosis.  MRI of brain showed acute nonhemorrhagic infarct in left internal capsule/basal ganglia with evidence of moderate parenchymal brain volume loss.  Neurology felt stroke was likely due to small vessel versus large vessel disease-left ICA soft plaque.  He recommends DAPT with Plavix x3 weeks followed by aspirin alone.   Patient has had improvement in sensory deficits however continued to be limited by right-sided weakness affecting gait as well as ADLs.  CIR was recommended for follow-up therapy   Hospital Course: Rahul Malinak was admitted to rehab 10/25/2018 for inpatient therapies to consist of PT and OT at least three hours five days a week. Past admission physiatrist, therapy team and rehab RN have worked together to provide customized collaborative inpatient rehab.  He was maintained on aspirin and Plavix for secondary stroke prevention.  Follow-up CBC shows H&H and platelets are stable.  Renal status and elevated TSH within normal limits.  Blood pressures have been well controlled off Cozaar.  P.o. intake has been good and he is continent of bowel and bladder.  Right knee pain with local measures and he was fitted with the right hinged neoprene brace to help with pain and instability.  He has made steady progress during his rehab stay and is modified independent at discharge.  He will continue to receive further follow-up home health PT and OT by [facility] past discharge   Rehab course: During patient's stay in rehab weekly team conferences were held to monitor patient's progress, set goals and discuss barriers to discharge. At admission, patient required mod assist with ADL tasks and with mobility. He  has had improvement in activity tolerance, balance, postural control as well as ability to compensate for deficits. He has had improvement in  functional use RUE  and RLE as well as improvement in awareness.  He is able to complete ADL tasks at modified independent level. He is modified independent for transfers and modified independent for ambulating 300 feet with rolling walker.   Disposition:  Home  Diet: Heart healthy.   Special Instructions: 1. No strenuous activity till cleared by MD. 2. Plavix to continue thorough November 30th. Continue to take ASA daily.    Discharge Instructions    Ambulatory  referral to Physical Medicine Rehab   Complete by:  As directed    1-2 weeks transitional care appt   Ambulatory referral to Vascular Surgery   Complete by:  As directed    Left carotid plaque/Recent stroke     Allergies as of 11/02/2018      Reactions   Amlodipine Nausea Only, Anxiety, Palpitations   Penicillins Itching   Has patient had a PCN reaction causing immediate rash, facial/tongue/throat swelling, SOB or lightheadedness with hypotension: No Has patient had a PCN reaction causing severe rash involving mucus membranes or skin necrosis: No Has patient had a PCN reaction that required hospitalization: No Has patient had a PCN reaction occurring within the last 10 years: No\ If all of the above answers are "NO", then may proceed with Cephalosporin use.      Medication List    STOP taking these medications   losartan 50 MG tablet Commonly known as:  COZAAR     TAKE these medications   acetaminophen 325 MG tablet Commonly known as:  TYLENOL Take 1-2 tablets (325-650 mg total) by mouth every 4 (four) hours as needed for mild pain.   aspirin 81 MG EC tablet Take 1 tablet (81 mg total) by mouth daily. Notes to patient:  Buy a big bottle of coated baby aspirin--you will be taking this daily from now on to prevent strokes.    atorvastatin 20 MG tablet Commonly known as:  LIPITOR Take 1 tablet (20 mg total) by mouth daily. Notes to patient:  New medication for cholesterol.   camphor-menthol lotion Commonly known as:  SARNA Apply topically 3 (three) times daily with meals. Notes to patient:  For itching--available over the counter   clopidogrel 75 MG tablet Commonly known as:  PLAVIX Take 1 tablet (75 mg total) by mouth daily. Notes to patient:  This is a new medication for  2 more weeks.    diltiazem 180 MG 24 hr capsule Commonly known as:  CARDIZEM CD Take 1 capsule (180 mg total) by mouth daily. Notes to patient:  You should have this at home   flecainide 50 MG  tablet Commonly known as:  TAMBOCOR Take 1 tablet (50 mg total) by mouth every 12 (twelve) hours. Notes to patient:  You should have this at home   levothyroxine 100 MCG tablet Commonly known as:  SYNTHROID, LEVOTHROID Take 1 tablet (100 mcg total) by mouth daily. Notes to patient:  You should have this at home.   metoprolol tartrate 25 MG tablet Commonly known as:  LOPRESSOR Take 0.5 tablets (12.5 mg total) by mouth 2 (two) times daily. What changed:  how much to take Notes to patient:  You should have this at home but are taking less of it.    polyethylene glycol packet Commonly known as:  MIRALAX / GLYCOLAX Take 17 g by mouth daily. Notes to patient:  For constipation--available over the counter      Follow-up Information    Ranelle Oyster, MD Follow up.  Specialty:  Physical Medicine and Rehabilitation Why:  Office will call you with follow up appointment Contact information: [address] [place] [address] [phone]        [facility]. Call in 2 day(s).   Specialty:  Neurology Why:  for follow up appointment Contact information: [address] [place] [id number] [phone]       Jacquelin HawkingMcElroy, [place], [place]. Call in 2 day(s).   Specialty:  Physician Assistant Why:  On Monday for hospital follow up appt Contact information: [address] [place] [address] [phone]        [facility] Follow up.   Specialty:  Vascular Surgery Why:  Office will call you with follow up appointment (regarding your carotid artery plaque) Contact information: [address] [place] [id number] [phone]          Signed: Jacquelynn Creeamela S Tylon Kemmerling 11/04/2018, 6:26 PM

## 2018-10-31 ENCOUNTER — Inpatient Hospital Stay (HOSPITAL_COMMUNITY): Payer: Self-pay | Admitting: *Deleted

## 2018-10-31 ENCOUNTER — Inpatient Hospital Stay (HOSPITAL_COMMUNITY): Payer: Self-pay

## 2018-10-31 ENCOUNTER — Inpatient Hospital Stay (HOSPITAL_COMMUNITY): Payer: Self-pay | Admitting: Physical Therapy

## 2018-10-31 NOTE — Progress Notes (Signed)
Occupational Therapy Session Note  Patient Details  Name: David Mosley MRN: [id number] Date of Birth: [birth year]  Today's Date: 10/31/2018 OT Individual Time: 1404-1500 OT Individual Time Calculation (min): 56 min    Short Term Goals: Week 1:  OT Short Term Goal 1 (Week 1): Pt will complete toilet transer wiht min A OT Short Term Goal 2 (Week 1): Pt will advance pants past hips wiht A for balance only to demo improved use of RUE functionally OT Short Term Goal 3 (Week 1): Pt will don B socks with supervision/VC PRN OT Short Term Goal 4 (Week 1): Pt will complete oral care with RUE and no foam handle  Skilled Therapeutic Interventions/Progress Updates:    1;1.Pt gathers clothing at ambulatory level to complete laundry with MOD I. OT discusses safe options at home for transporting clothing to washer/dryer as well as use of RW during IADLs for safety awareness. Pt completes kitchen activity gathering and beginning to make spice cake with MOD I at ambulatory level with RW. Pt demo good safety awareness as evidenced by RW management throughout activity. Pt requires VC for equal weigth distribution over B feet when standing. Exited session with pt seated in recliner, call light in reach and all needs met  Therapy Documentation Precautions:  Precautions Precautions: Fall Precaution Comments: Permissive HTN 220/110 per neurology consult note Restrictions Weight Bearing Restrictions: No General:   Vital Signs: Therapy Vitals Temp: 98 F (36.7 C) Temp Source: Oral Pulse Rate: 60 Resp: 18 BP: (!) 145/80 Patient Position (if appropriate): Sitting Oxygen Therapy SpO2: 98 % O2 Device: Room Air Pain:   ADL: ADL Eating: Set up Grooming: Supervision/safety Upper Body Bathing: Minimal assistance Where Assessed-Upper Body Bathing: Shower Lower Body Bathing: Minimal assistance Where Assessed-Lower Body Bathing: Shower Upper Body Dressing: Supervision/safety Where Assessed-Upper Body  Dressing: Sitting at sink Lower Body Dressing: Minimal assistance Where Assessed-Lower Body Dressing: Wheelchair, Sitting at sink Toileting: Moderate assistance Where Assessed-Toileting: Glass blower/designer: Moderate assistance Toilet Transfer Equipment: Geophysical data processor: Moderate assistance Social research officer, government Method: Radiographer, therapeutic: Nurse, learning disability    Praxis   Exercises:   Other Treatments:     Therapy/Group: Individual Therapy  Tonny Branch 10/31/2018, 3:03 PM

## 2018-10-31 NOTE — Progress Notes (Signed)
[place] PHYSICAL MEDICINE & REHABILITATION PROGRESS NOTE   Subjective/Complaints:  No new issues. Waiting for breakfast.   ROS: Patient denies fever, rash, sore throat, blurred vision, nausea, vomiting, diarrhea, cough, shortness of breath or chest pain, joint or back pain, headache, or mood change.   Objective:   No results found. Recent Labs    10/30/18 0446  WBC 9.0  HGB 14.8  HCT 46.2  PLT 176   No results for input(s): NA, K, CL, CO2, GLUCOSE, BUN, CREATININE, CALCIUM in the last 72 hours.  Intake/Output Summary (Last 24 hours) at 10/31/2018 1010 Last data filed at 10/31/2018 0807 Gross per 24 hour  Intake 960 ml  Output 400 ml  Net 560 ml     Physical Exam: Vital Signs Blood pressure (!) 157/75, pulse 60, temperature 98 F (36.7 C), temperature source Oral, resp. rate 18, height 5\' 8"  (1.727 m), weight 93 kg, SpO2 99 %.   Constitutional: No distress . Vital signs reviewed. HEENT: EOMI, oral membranes moist Neck: supple Cardiovascular: RRR without murmur. No JVD    Respiratory: CTA Bilaterally without wheezes or rales. Normal effort    GI: BS +, non-tender, non-distended  Neurologic: Cranial nerves II through XII intact, motor strength is 5/5 in left deltoid, bicep, tricep, grip, hip flexor, knee extensors, ankle dorsiflexor and plantar flexor,   5/5 on the right. RUE 4+/5. RLE 4/5 with pain inhibition at right knee.--unchanged Sensory exam normal sensation to light touch and proprioception in bilateral upper and lower extremities. Improved coordination RUE and RLE.    Musculoskeletal: . Right knee tender  Assessment/Plan: 1. 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  Physiatrist is providing close team supervision and 24 hour management of active medical problems listed below.  Physiatrist and  rehab team continue to assess barriers to discharge/monitor patient progress toward functional and medical goals  Care Tool:  Bathing    Body parts bathed by patient: Right arm, Left arm, Chest, Abdomen, Front perineal area, Buttocks, Right upper leg, Left upper leg, Left lower leg, Right lower leg, Face         Bathing assist Assist Level: Supervision/Verbal cueing     Upper Body Dressing/Undressing Upper body dressing   What is the patient wearing?: Pull over shirt    Upper body assist Assist Level: Supervision/Verbal cueing    Lower Body Dressing/Undressing Lower body dressing      What is the patient wearing?: Underwear/pull up, Pants     Lower body assist Assist for lower body dressing: Supervision/Verbal cueing     Toileting Toileting    Toileting assist Assist for toileting: Supervision/Verbal cueing     Transfers Chair/bed transfer  Transfers assist     Chair/bed transfer assist level: Minimal Assistance - Patient > 75%     Locomotion Ambulation   Ambulation assist      Assist level: Contact Guard/Touching assist Assistive device: Walker-rolling Max distance: 150   Walk 10 feet activity   Assist     Assist level: Contact Guard/Touching assist Assistive device: Walker-rolling   Walk 50 feet activity   Assist    Assist level: Contact Guard/Touching assist Assistive device: Walker-rolling    Walk 150 feet activity   Assist    Assist level: Contact Guard/Touching assist Assistive device: Walker-rolling    Walk 10 feet on uneven surface  activity   Assist  Wheelchair     Assist Will patient use wheelchair at discharge?: No Type of Wheelchair: Manual    Wheelchair assist level: Minimal Assistance - Patient > 75% Max wheelchair distance: 100    Wheelchair 50 feet with 2 turns activity    Assist        Assist Level: Minimal Assistance - Patient > 75%   Wheelchair 150 feet activity      Assist Wheelchair 150 feet activity did not occur: Safety/medical concerns         Medical Problem List and Plan: 1.Functional deficitssecondary toLeft internal capsule/basal ganglia stroke. --Continue CIR therapies including PT, OT, and SLP   -ELOS 11/16 2. DVT Prophylaxis/Anticoagulation: Pharmaceutical:Lovenox 3. Pain Management:Tylenol and ice as needed for right knee pain. Add baclofen as needed for spasms.  -  hinged neoprene brace for right knee  -AFO assisting gait 4. Mood:LCSW to follow for evaluation and support. 5. Neuropsych: This patientiscapable of making decisions on own behalf. 6. Skin/Wound Care:Routine pressure relief measures. Maintain adequate nutrition and hydration status. 7. Fluids/Electrolytes/Nutrition:Monitor I's and O's. -excellent PO intake 8.Hypertension:Monitor blood pressures twice daily. Continue Cardizem and Metoprolol. Vitals:   10/30/18 1949 10/31/18 0502  BP: 129/85 (!) 157/75  Pulse: (!) 57 60  Resp: 18 18  Temp: 98 F (36.7 C) 98 F (36.7 C)  SpO2: 100% 99%  Blood pressure ok 11/14 9. History of PSVT: Monitorheart rate twice daily basis. Continue Cardizem, Metoprolol and Tambocor 10.Tobacco abuse:  continue nicotine patch. 11. Dyslipidemia: on Lipitor. 12. Constipation: Start Miralax.  Dulcolax suppository every 3 days as needed no BM    LOS: 6 days A FACE TO FACE EVALUATION WAS PERFORMED  David Mosley 10/31/2018, 10:10 AM

## 2018-10-31 NOTE — Progress Notes (Signed)
Physical Therapy Session Note  Patient Details  Name: David Mosley MRN: [id number] Date of Birth: Jan 31, 1956  Today's Date: 10/31/2018 PT Individual Time: 1511-1630 PT Individual Time Calculation (min): 79 min   Short Term Goals: Week 1:  PT Short Term Goal 1 (Week 1): Pt will ambulate 150' min assist with AD PT Short Term Goal 2 (Week 1): Pt will ambulate up and down 2 stairs using 2 handrails with min assist PT Short Term Goal 3 (Week 1): Pt will perform stand pivot transfer min assist with AD  Skilled Therapeutic Interventions/Progress Updates:    no c/o pain.  Session focus on activity tolerance for functional mobility and IADL tasks.    Pt transfers and ambulates throughout the session with set up assist, occasional supervision verbal cues for attention to RLE.  Pt completes complex cooking task at mod I level.  Pt completes laundry task at mod I level.  RUE NMR for improved motor control with theraputty tasks at tabletop level.  Pt returned to room at end of session and positioned in recliner with call bell in reach and needs met.   Therapy Documentation Precautions:  Precautions Precautions: Fall Precaution Comments: Permissive HTN 220/110 per neurology consult note Restrictions Weight Bearing Restrictions: No   Therapy/Group: Individual Therapy  Michel Santee 10/31/2018, 4:35 PM

## 2018-10-31 NOTE — Care Management (Signed)
[facility] Individual Statement of Services  Patient Name:  David Mosley  Date:  10/29/2018  Welcome to the [facility].  Our goal is to provide you with an individualized program based on your diagnosis and situation, designed to meet your specific needs.  With this comprehensive rehabilitation program, you will be expected to participate in at least 3 hours of rehabilitation therapies Monday-Friday, with modified therapy programming on the weekends.  Your rehabilitation program will include the following services:  Physical Therapy (PT), Occupational Therapy (OT), Speech Therapy (ST), 24 hour per day rehabilitation nursing, Therapeutic Recreaction (TR), Neuropsychology, Case Management (Social Worker), Rehabilitation Medicine, Nutrition Services and Pharmacy Services  Weekly team conferences will be held on Tuesdays to discuss your progress.  Your Social Worker will talk with you frequently to get your input and to update you on team discussions.  Team conferences with you and your family in attendance may also be held.  Expected length of stay: 10-14 days   Overall anticipated outcome: modified independent  Depending on your progress and recovery, your program may change. Your Social Worker will coordinate services and will keep you informed of any changes. Your Social Worker's name and contact numbers are listed  below.  The following services may also be recommended but are not provided by the [facility]:   Driving Evaluations  Home Health Rehabiltiation Services  Outpatient Rehabilitation Services  Vocational Rehabilitation   Arrangements will be made to provide these services after discharge if needed.  Arrangements include referral to agencies that provide these services.  Your insurance has been verified to be:  None Your primary doctor is:  Julieta BelliniShanno McElroy, [place]  Pertinent information will be shared with your doctor  and your insurance company.  Social Worker:  EphraimLucy Thierno Hun, [place] [phone] or ([phone]) [phone]   Information discussed with and copy given to patient by: Amada JupiterHOYLE, Jihan Rudy, 10/28/2018, 3:50 PM

## 2018-10-31 NOTE — Progress Notes (Signed)
Orthopedic Tech Progress Note Patient Details:  David NewcomerDonnie Mosley 04/07/1956 [id number]  Patient ID: David Newcomeronnie Wedel, male   DOB: 01/01/1956, 62 y.o.   MRN: [id number] Rn states pt has brace.  Trinna PostMartinez, Yashas Camilli J 10/31/2018, 9:12 AM

## 2018-10-31 NOTE — Progress Notes (Signed)
Social Work Patient ID: David NewcomerDonnie Mosley, male   DOB: 11/12/1956, 62 y.o.   MRN: [id number]  Have reviewed team conference with pt. He is aware and agreeable with targeted d/c date of 11/16 and goals of modified independent.  Very eager for d/c.  Questions about SSD and MA - have asked [employer] [employer] counselor to follow up with pt prior to d/c.  Will need assist with meds and HH for d/c as well.  Continue to follow.  Azarah Dacy, LCSW

## 2018-10-31 NOTE — Progress Notes (Signed)
Recreational Therapy Discharge Summary Patient Details  Name: David Mosley MRN: [id number] Date of Birth: 01/01/1956 Today's Date: 10/31/2018 Comments on progress toward goals: Pt is scheduled for discharge home 11/16.  TR session focused on leisure assessment, activity analysis with potential modifications, safety awareness and dynamic balance.  Pt participated in kitchen task on 11/13 at supervision ambulatory level with UE support with meal prep task to be scheduled to occur today.  Unfortunately a conflict in scheduling postponed the meal prep activity to a time outside LRT working hours. (Cooking activity to occur during afternoon PT session.) Pt is discharging home at supervision level for TR specific tasks due to performance during eval session.  Reasons for discharge: discharge from hospital Patient/family agrees with progress made and goals achieved: Yes  Ravi Tuccillo 10/31/2018, 12:15 PM

## 2018-10-31 NOTE — Progress Notes (Signed)
Physical Therapy Session Note  Patient Details  Name: David Mosley MRN: [id number] Date of Birth: 03-22-1956  Today's Date: 10/31/2018 PT Individual Time: 1100-1200 PT Individual Time Calculation (min): 60 min   Short Term Goals: Week 1:  PT Short Term Goal 1 (Week 1): Pt will ambulate 150' min assist with AD PT Short Term Goal 2 (Week 1): Pt will ambulate up and down 2 stairs using 2 handrails with min assist PT Short Term Goal 3 (Week 1): Pt will perform stand pivot transfer min assist with AD  Skilled Therapeutic Interventions/Progress Updates:    no c/o pain.  Session focus on gait and functional mobility.  Pt participated in orthotics consult for first part of session and assessed for R PLS AFO and toe cap.  Transfers throughout session with supervision, min cues for hand placement with good carryover.  Pt ambulates throughout session, with AFO and distant supervision, without AFO and close supervision with verbal cues for attention to RLE clearance during swing.  High level gait stepping over obstacles with supervision, pt able to recognize and self correct errors.  Furniture transfer off low couch in apartment with supervision.  Nustep x10 minutes at level 4-6 for global endurance and strengthening.  Pt returned to room at end of session and positioned in recliner with call bell in reach and needs met.    Therapy Documentation Precautions:  Precautions Precautions: Fall Precaution Comments: Permissive HTN 220/110 per neurology consult note Restrictions Weight Bearing Restrictions: No    Therapy/Group: Individual Therapy  Michel Santee 10/31/2018, 11:52 AM

## 2018-11-01 ENCOUNTER — Inpatient Hospital Stay (HOSPITAL_COMMUNITY): Payer: Self-pay | Admitting: Occupational Therapy

## 2018-11-01 ENCOUNTER — Inpatient Hospital Stay (HOSPITAL_COMMUNITY): Payer: Self-pay | Admitting: Physical Therapy

## 2018-11-01 ENCOUNTER — Encounter (HOSPITAL_COMMUNITY): Payer: Self-pay

## 2018-11-01 MED ORDER — CAMPHOR-MENTHOL 0.5-0.5 % EX LOTN: TOPICAL_LOTION | Freq: Three times a day (TID) | CUTANEOUS | 0 refills | Status: DC

## 2018-11-01 MED ORDER — ASPIRIN 81 MG PO TBEC: 81.0000 mg | DELAYED_RELEASE_TABLET | Freq: Every day | ORAL | 0 refills | Status: AC

## 2018-11-01 MED ORDER — FLECAINIDE ACETATE 50 MG PO TABS: 50.0000 mg | ORAL_TABLET | Freq: Two times a day (BID) | ORAL | 0 refills | Status: DC

## 2018-11-01 MED ORDER — LEVOTHYROXINE SODIUM 100 MCG PO TABS: 100.0000 ug | ORAL_TABLET | Freq: Every day | ORAL | 0 refills | Status: DC

## 2018-11-01 MED ORDER — POLYETHYLENE GLYCOL 3350 17 G PO PACK: 17.0000 g | PACK | Freq: Every day | ORAL | 0 refills | Status: DC

## 2018-11-01 MED ORDER — CLOPIDOGREL BISULFATE 75 MG PO TABS: 75.0000 mg | ORAL_TABLET | Freq: Every day | ORAL | 0 refills | Status: DC

## 2018-11-01 MED ORDER — ATORVASTATIN CALCIUM 20 MG PO TABS: 20.0000 mg | ORAL_TABLET | Freq: Every day | ORAL | 0 refills | Status: DC

## 2018-11-01 MED ORDER — DILTIAZEM HCL ER COATED BEADS 180 MG PO CP24: 180.0000 mg | ORAL_CAPSULE | Freq: Every day | ORAL | 0 refills | Status: DC

## 2018-11-01 MED ORDER — METOPROLOL TARTRATE 25 MG PO TABS: 12.5000 mg | ORAL_TABLET | Freq: Two times a day (BID) | ORAL | 0 refills | Status: DC

## 2018-11-01 NOTE — Progress Notes (Signed)
[facility] PHYSICAL MEDICINE & REHABILITATION PROGRESS NOTE   Subjective/Complaints:  No new complaints. States that AFO is "turning his right foot out" when he wears it.   ROS: Patient denies fever, rash, sore throat, blurred vision, nausea, vomiting, diarrhea, cough, shortness of breath or chest pain,  headache, or mood change.    Objective:   No results found. Recent Labs    10/30/18 0446  WBC 9.0  HGB 14.8  HCT 46.2  PLT 176   No results for input(s): NA, K, CL, CO2, GLUCOSE, BUN, CREATININE, CALCIUM in the last 72 hours.  Intake/Output Summary (Last 24 hours) at 11/01/2018 0927 Last data filed at 11/01/2018 0507 Gross per 24 hour  Intake 300 ml  Output 850 ml  Net -550 ml     Physical Exam: Vital Signs Blood pressure (!) 155/89, pulse 61, temperature 98.1 F (36.7 C), resp. rate 18, height 5\' 8"  (1.727 m), weight 93 kg, SpO2 97 %.   Constitutional: No distress . Vital signs reviewed. HEENT: EOMI, oral membranes moist Neck: supple Cardiovascular: RRR without murmur. No JVD    Respiratory: CTA Bilaterally without wheezes or rales. Normal effort    GI: BS +, non-tender, non-distended  Neurologic: Cranial nerves II through XII intact, motor strength is 5/5 in left deltoid, bicep, tricep, grip, hip flexor, knee extensors, ankle dorsiflexor and plantar flexor,   5/5 on the right. RUE 4+/5. RLE 4/5 with pain inhibition at right knee, weakness with right ADF/PF.  Sensory exam normal sensation to light touch and proprioception in bilateral upper and lower extremities. Improved coordination RUE and RLE.    Musculoskeletal: . Right knee remains somewhat tender  Assessment/Plan: 1. 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  Physiatrist is providing close team supervision and 24 hour management of active medical  problems listed below.  Physiatrist and rehab team continue to assess barriers to discharge/monitor patient progress toward functional and medical goals  Care Tool:  Bathing    Body parts bathed by patient: Right arm, Left arm, Chest, Abdomen, Front perineal area, Buttocks, Right upper leg, Left upper leg, Left lower leg, Right lower leg, Face         Bathing assist Assist Level: Supervision/Verbal cueing     Upper Body Dressing/Undressing Upper body dressing   What is the patient wearing?: Pull over shirt    Upper body assist Assist Level: Supervision/Verbal cueing    Lower Body Dressing/Undressing Lower body dressing      What is the patient wearing?: Underwear/pull up, Pants     Lower body assist Assist for lower body dressing: Supervision/Verbal cueing     Toileting Toileting    Toileting assist Assist for toileting: Supervision/Verbal cueing     Transfers Chair/bed transfer  Transfers assist     Chair/bed transfer assist level: Set up assist     Locomotion Ambulation   Ambulation assist      Assist level: Set up assist Assistive device: Walker-rolling Max distance: 200   Walk 10 feet activity   Assist     Assist level: Set up assist Assistive device: Walker-rolling   Walk 50 feet activity   Assist    Assist level: Set up assist Assistive device: Walker-rolling    Walk 150 feet activity   Assist    Assist level: Set up assist Assistive device: Walker-rolling    Walk 10 feet on  uneven surface  activity   Assist           Wheelchair     Assist Will patient use wheelchair at discharge?: No Type of Wheelchair: Manual    Wheelchair assist level: Minimal Assistance - Patient > 75% Max wheelchair distance: 100    Wheelchair 50 feet with 2 turns activity    Assist        Assist Level: Minimal Assistance - Patient > 75%   Wheelchair 150 feet activity     Assist Wheelchair 150 feet activity did not  occur: Safety/medical concerns         Medical Problem List and Plan: 1.Functional deficitssecondary toLeft internal capsule/basal ganglia stroke. --Continue CIR therapies including PT, OT, and SLP   -ELOS 11/16  -Patient to see Rehab MD/provider in the office for transitional care encounter in 1-2 weeks.  2. DVT Prophylaxis/Anticoagulation: Pharmaceutical:Lovenox 3. Pain Management:Tylenol and ice as needed for right knee pain. Add baclofen as needed for spasms.  -  hinged neoprene brace for right knee  -AFO assisting gait. Examined brace. Seems to be aligned and fitting properly. Needs to make sure foot plate is correctly positioned in shoe. If still externally rotating the foot, we can contact [facility] 4. Mood:LCSW to follow for evaluation and support. 5. Neuropsych: This patientiscapable of making decisions on own behalf. 6. Skin/Wound Care:Routine pressure relief measures. Maintain adequate nutrition and hydration status. 7. Fluids/Electrolytes/Nutrition:Monitor I's and O's. -excellent PO intake 8.Hypertension:Monitor blood pressures twice daily. Continue Cardizem and Metoprolol. Vitals:   10/31/18 2033 11/01/18 0504  BP: 125/74 (!) 155/89  Pulse: (!) 55 61  Resp: 17 18  Temp: (!) 97.4 F (36.3 C) 98.1 F (36.7 C)  SpO2: 93% 97%  Blood pressure borderline 11/15---no changes today 9. History of PSVT: Monitorheart rate twice daily basis. Continue Cardizem, Metoprolol and Tambocor 10.Tobacco abuse:  continue nicotine patch. 11. Dyslipidemia: on Lipitor. 12. Constipation: Start Miralax.  Dulcolax suppository every 3 days as needed no BM    LOS: 7 days A FACE TO FACE EVALUATION WAS PERFORMED  Ranelle Oyster 11/01/2018, 9:27 AM

## 2018-11-01 NOTE — Discharge Instructions (Signed)
Inpatient Rehab Discharge Instructions  David Mosley Discharge date and time:  11/02/18  Activities/Precautions/ Functional Status: Activity: no lifting, driving, or strenuous exercise till cleared by MD Diet: cardiac diet Wound Care: keep wound clean and dry    Functional status:  ___ No restrictions     ___ Walk up steps independently ___ 24/7 supervision/assistance   ___ Walk up steps with assistance _X__ Intermittent supervision/assistance  _X__ Bathe/dress independently ___ Walk with walker     ___ Bathe/dress with assistance ___ Walk Independently    ___ Shower independently ___ Walk with assistance    ___ Shower with assistance _X__ No alcohol     ___ Return to work/school ________    COMMUNITY REFERRALS UPON DISCHARGE:    Home Health:   PT     OT                      Agency:  [facility] Phone: [phone]   Medical Equipment/Items Ordered: rolling walker                                                     Ag[facility]Supplier:  [facility]   GENERAL COMMUNITY RESOURCES FOR PATIENT/FAMILY:  Support Groups:  Stroke support group (handout)      Special Instructions: 1.Plavix and Lipitor are the only new medications--all have been called in to [facility] in case you need a refill on your old medications. Plavix will stop in two weeks on 11/30.  2. Dr. Riley Kill to discuss return to work on follow up appointment.     STROKE/TIA DISCHARGE INSTRUCTIONS SMOKING Cigarette smoking nearly doubles your risk of having a stroke & is the single most alterable risk factor  If you smoke or have smoked in the last 12 months, you are advised to quit smoking for your health.  Most of the excess cardiovascular risk related to smoking disappears within a year of stopping.  Ask you doctor about anti-smoking medications  [place] Quit Line: 1-800-QUIT NOW  Free Smoking Cessation Classes [phone]  CHOLESTEROL Know your levels; limit fat & cholesterol in your diet  Lipid Panel       Component Value Date/Time   CHOL 119 10/24/2018 0330   TRIG 118 10/24/2018 0330   HDL 32 (L) 10/24/2018 0330   CHOLHDL 3.7 10/24/2018 0330   VLDL 24 10/24/2018 0330   LDLCALC 63 10/24/2018 0330      Many patients benefit from treatment even if their cholesterol is at goal.  Goal: Total Cholesterol (CHOL) less than 160  Goal:  Triglycerides (TRIG) less than 150  Goal:  HDL greater than 40  Goal:  LDL (LDLCALC) less than 100   BLOOD PRESSURE American Stroke Association blood pressure target is less that 120/80 mm/Hg  Your discharge blood pressure is:  BP: 136/80  Monitor your blood pressure  Limit your salt and alcohol intake  Many individuals will require more than one medication for high blood pressure  DIABETES (A1c is a blood sugar average for last 3 months) Goal HGBA1c is under 7% (HBGA1c is blood sugar average for last 3 months)  Diabetes: No known diagnosis of diabetes    Lab Results  Component Value Date   HGBA1C 5.8 (H) 10/24/2018     Your HGBA1c can be lowered with medications, healthy diet, and exercise.  Check your blood sugar as directed by your physician  Call your physician if you experience unexplained or low blood sugars.  PHYSICAL ACTIVITY/REHABILITATION Goal is 30 minutes at least 4 days per week  Activity: No driving, Therapies: see above Return to work: to be decided on follow up.  Activity decreases your risk of heart attack and stroke and makes your heart stronger.  It helps control your weight and blood pressure; helps you relax and can improve your mood.  Participate in a regular exercise program.  Talk with your doctor about the best form of exercise for you (dancing, walking, swimming, cycling).  DIET/WEIGHT Goal is to maintain a healthy weight  Your discharge diet is:  Diet Order            Diet Heart Room service appropriate? Yes; Fluid consistency: Thin  Diet effective now             liquids Your height is:  Height: 5\' 8"   (172.7 cm) Your current weight is: Weight: 93 kg Your Body Mass Index (BMI) is:  BMI (Calculated): 31.18  Following the type of diet specifically designed for you will help prevent another stroke.  Your goal weight is: 164 lbs  Your goal Body Mass Index (BMI) is 19-24.  Healthy food habits can help reduce 3 risk factors for stroke:  High cholesterol, hypertension, and excess weight.  RESOURCES Stroke/Support Group:  Call [phone]   STROKE EDUCATION PROVIDED/REVIEWED AND GIVEN TO PATIENT Stroke warning signs and symptoms How to activate emergency medical system (call 911). Medications prescribed at discharge. Need for follow-up after discharge. Personal risk factors for stroke. Pneumonia vaccine given:  Flu vaccine given:  My questions have been answered, the writing is legible, and I understand these instructions.  I will adhere to these goals & educational materials that have been provided to me after my discharge from the hospital.     My questions have been answered and I understand these instructions. I will adhere to these goals and the provided educational materials after my discharge from the hospital.  Patient/Caregiver Signature _______________________________ Date __________  Clinician Signature _______________________________________ Date __________  Please bring this form and your medication list with you to all your follow-up doctor's appointments.

## 2018-11-01 NOTE — Progress Notes (Signed)
Physical Therapy Discharge Summary  Patient Details  Name: David Mosley MRN: [id number] Date of Birth: [birth year]  Today's Date: 11/01/2018 PT Individual Time: 1100-1200 PT Individual Time Calculation (min): 60 min    Patient has met 13 of 13 long term goals due to improved activity tolerance, improved balance, improved postural control, increased strength, ability to compensate for deficits, functional use of  right upper extremity and right lower extremity, improved attention, improved awareness and improved coordination.  Patient to discharge at an ambulatory level Modified Independent.     Reasons goals not met: n/a  Recommendation:  Patient will benefit from ongoing skilled PT services in home health setting to continue to advance safe functional mobility, address ongoing impairments in balance, coordination, and safety awareness, and minimize fall risk.  Equipment: RW  Reasons for discharge: treatment goals met  Patient/family agrees with progress made and goals achieved: Yes   Skilled PT : No c/o pain.  Session focus on d/c assessment and pt education.  Pt currently performing all mobility with mod I using RW.  Gait up to 300' throughout session, pt attentive to and self corrects errors with RLE.  PT instructed pt in floor transfers mod I and falls recovery including when to call EMS.  Nustep at level 6 x10 minutes focus on overall endurance and reciprocal stepping pattern retraining.  Pt returned to room at end of session, positioned in recliner with call bell in reach and needs met.   PT Discharge Precautions/Restrictions Precautions Precautions: Fall Precaution Comments: R hemiparesis Restrictions Weight Bearing Restrictions: No Pain Pain Assessment Pain Scale: 0-10 Pain Score: 0-No pain Vision/Perception  Perception Perception: Within Functional Limits Praxis Praxis: Intact  Cognition Overall Cognitive Status: Within Functional Limits for tasks  assessed Arousal/Alertness: Awake/alert Orientation Level: Oriented X4 Attention: Selective Selective Attention: Appears intact Memory: Impaired Awareness: Appears intact Safety/Judgment: Appears intact Sensation Sensation Light Touch: Appears Intact(occasional tingling in lateral border of R foot) Hot/Cold: Appears Intact Proprioception: Impaired Detail Proprioception Impaired Details: Impaired RUE(during reaching tasks) Coordination Gross Motor Movements are Fluid and Coordinated: Yes Fine Motor Movements are Fluid and Coordinated: No Coordination and Movement Description: Deficits with hand-eye coordination R UE. Able to meet FM demands when opening plastic sock package and manipulating toothbrush/toothpaste using Rt Finger Nose Finger Test: Mild dysmetria R UE Motor  Motor Motor: Hemiplegia Motor - Discharge Observations: R hemiparesis, improving since evaluation   Mobility Bed Mobility Bed Mobility: Rolling Right;Rolling Left;Left Sidelying to Sit;Sit to Supine Rolling Right: Independent Rolling Left: Independent Left Sidelying to Sit: Independent Sit to Supine: Independent Transfers Transfers: Stand Pivot Transfers;Stand to Sit Sit to Stand: Independent with assistive device Stand to Sit: Independent with assistive device Stand Pivot Transfers: Independent with assistive device Squat Pivot Transfers: Independent with assistive device Transfer (Assistive device): Rolling walker Locomotion  Gait Ambulation: Yes Gait Assistance: Independent with assistive device Gait Distance (Feet): 300 Feet Assistive device: Rolling walker Gait Gait: Yes Gait Pattern: Decreased step length - right;Narrow base of support;Decreased hip/knee flexion - right;Poor foot clearance - right;Decreased weight shift to right;Decreased dorsiflexion - right Stairs / Additional Locomotion Stairs: Yes Stairs Assistance: Independent with assistive device Stair Management Technique: Two  rails Number of Stairs: 12 Ramp: Supervision/Verbal cueing(cuers for walker proximity) Wheelchair Mobility Wheelchair Mobility: No  Trunk/Postural Assessment  Cervical Assessment Cervical Assessment: Within Functional Limits Thoracic Assessment Thoracic Assessment: Within Functional Limits Lumbar Assessment Lumbar Assessment: Within Functional Limits Postural Control Postural Control: Within Functional Limits  Balance Balance Balance Assessed: Yes Static Sitting  Balance Static Sitting - Balance Support: Feet supported Static Sitting - Level of Assistance: 7: Independent Dynamic Sitting Balance Dynamic Sitting - Balance Support: Feet supported;During functional activity;No upper extremity supported Dynamic Sitting - Level of Assistance: 6: Modified independent (Device/Increase time) Static Standing Balance Static Standing - Balance Support: No upper extremity supported;During functional activity Static Standing - Level of Assistance: 6: Modified independent (Device/Increase time) Dynamic Standing Balance Dynamic Standing - Balance Support: During functional activity;No upper extremity supported Dynamic Standing - Level of Assistance: 6: Modified independent (Device/Increase time) Dynamic Standing - Balance Activities: Lateral lean/weight shifting;Forward lean/weight shifting;Reaching for objects;Reaching for weighted objects Dynamic Standing - Comments: Gathering clothing items with RW. Able to lift and move his weighted leather jacket without LOB Extremity Assessment  RUE Assessment General Strength Comments: (4-/5 shoulder flexion, 4+/5 elbow flexion, 4-/5 tricep extension) LUE Assessment LUE Assessment: (5/5 proximal to distal) RLE Assessment Passive Range of Motion (PROM) Comments: WFL Active Range of Motion (AROM) Comments: WFL General Strength Comments: grossly 4/5, ankle not assessed 2/2 AFO LLE Assessment LLE Assessment: Within Functional Limits    Michel Santee 11/01/2018, 11:28 AM

## 2018-11-01 NOTE — Progress Notes (Signed)
Physical Therapy Session Note  Patient Details  Name: David Mosley MRN: [id number] Date of Birth: 1956-03-14  Today's Date: 11/01/2018 PT Individual Time: 1430-1500 PT Individual Time Calculation (min): 30 min   Short Term Goals: Week 1:  PT Short Term Goal 1 (Week 1): Pt will ambulate 150' min assist with AD PT Short Term Goal 2 (Week 1): Pt will ambulate up and down 2 stairs using 2 handrails with min assist PT Short Term Goal 3 (Week 1): Pt will perform stand pivot transfer min assist with AD  Skilled Therapeutic Interventions/Progress Updates:   Pt received sitting in WC and agreeable to PT. Gait training with RW and RAFO through various environments including hall of hospital and gift shop [id number], + [id number]+ [id number] cues or assist from PT, but pt noted to have mild foot drag on the R with fatigue. Patient returned to room and left sitting in WC [facility] call bell in reach and all needs met.        Therapy Documentation Precautions:  Precautions Precautions: Fall Precaution Comments: R hemiparesis Restrictions Weight Bearing Restrictions: No Pain 0/10    Therapy/Group: Individual Therapy  AusLorie Phenix/15/2019, 2:46 PM

## 2018-11-01 NOTE — Progress Notes (Signed)
Social Work  Discharge Note  The overall goal for the admission was met for:   Discharge location: Yes - home alone.  Family able to provide intermittent support  Length of Stay: Yes - 8 days (with discharge 11/16)  Discharge activity level: Yes - modified independent  Home/community participation: Yes  Services provided included: MD, RD, PT, OT, SLP, RN, TR, Pharmacy and SW  Financial Services: Medicaid and SSD applications planned to be started.  [facility] dept following.  Follow-up services arranged: Home Health: PT, OT via [place], DME: rolling walker via [place] and Patient/Family has no preference for HH/DME agencies  Comments (or additional information):  Patient/Family verbalized understanding of follow-up arrangements: Yes  Individual responsible for coordination of the follow-up plan: pt  Confirmed correct DME delivered: Jada Kuhnert 11/01/2018    Azara Gemme

## 2018-11-01 NOTE — Progress Notes (Signed)
Occupational Therapy Discharge Summary  Patient Details  Name: David Mosley MRN: [id number] Date of Birth: Sep 19, 1956  Today's Date: 11/01/2018 OT Individual Time: 3419-3790 OT Individual Time Calculation (min): 61 min   Patient has met 16 of 16 long term goals due to improved activity tolerance, improved balance, postural control, ability to compensate for deficits, functional use of  RIGHT upper extremity, improved awareness and improved coordination.  Patient to discharge at overall Modified Independent level.  Patient reports family can provide intermittent supervision at time of d/c.   All goals met.   Recommendation:  Patient will benefit from ongoing skilled OT services in home health setting to continue to advance functional skills in the area of iADL and Vocation.  Equipment: shower chair  Reasons for discharge: treatment goals met and discharge from hospital  Patient/family agrees with progress made and goals achieved: Yes  Skilled Therapeutic Intervention:  Pt greeted in recliner with no c/o pain. Tx focus on reevaluation of BADL skills, balance, Rt NMR, and d/c planning. Pt ambulated throughout session with RW at Mod I level. He gathered clothing items at closet, lifting weighted leather coat with R UE without LOB. Pt then proceeded into bathroom, and completed bathing, dressing, and toileting with Mod I. When back in room, he completed oral care/grooming tasks while standing at sink using RW. We discussed bathroom DME for home as well as OT f/u. Educated pt to always reach for ADL/IADL items with R UE at home to improve hand-eye coordination and proprioceptive awareness. At end of session pt was left in recliner. Provided Mod I sign for door and educated pt on OT goal achievement.   OT Discharge Precautions/Restrictions  Precautions Precautions: Fall Vital Signs Therapy Vitals Temp: 98.1 F (36.7 C) Pulse Rate: 61 Resp: 18 BP: (!) 155/89 Patient Position (if  appropriate): Lying Oxygen Therapy SpO2: 97 % O2 Device: Room Air Pain No c/o pain during session  Pain Assessment Pain Scale: 0-10 Pain Score: 0-No pain ADL ADL Eating: Not assessed Grooming: Modified independent Where Assessed-Grooming: Standing at sink Upper Body Bathing: Modified independent Where Assessed-Upper Body Bathing: Shower Lower Body Bathing: Modified independent Where Assessed-Lower Body Bathing: Shower Upper Body Dressing: Modified independent (Device) Where Assessed-Upper Body Dressing: Other (Comment)(from TTB) Lower Body Dressing: Modified independent Where Assessed-Lower Body Dressing: Other (Comment)(sit<stand from TTB) Toileting: Modified independent Where Assessed-Toileting: Glass blower/designer: Diplomatic Services operational officer Method: Counselling psychologist: Energy manager: Modified independent Social research officer, government Method: Heritage manager: Radio broadcast assistant, Grab bars Vision Baseline Vision/History: Wears glasses Wears Glasses: At all times Patient Visual Report: blurring of vision (David little worse than before hospitalization)  Perception  Perception: Within Functional Limits Praxis Praxis: Intact Cognition Overall Cognitive Status: Within Functional Limits for tasks assessed Arousal/Alertness: Awake/alert Orientation Level: Oriented X4 Awareness: Appears intact Safety/Judgment: Appears intact Sensation Sensation Light Touch: Appears Intact Hot/Cold: Appears Intact Proprioception: Impaired R UE (during reaching tasks) Coordination Gross Motor Movements are Fluid and Coordinated: No Fine Motor Movements are Fluid and Coordinated: Yes Coordination and Movement Description: Deficits with hand-eye coordination R UE. Able to meet FM demands throughout ADL, such as opening plastic sock package and manipulating toothbrush/toothpaste using Rt Motor  Motor Motor: Hemiplegia Motor -  Discharge Observations: Mild Rt hemi  Mobility  Transfers Sit to Stand: Independent with assistive device Stand to Sit: Independent with assistive device  Trunk/Postural Assessment  Cervical Assessment Cervical Assessment: Within Functional Limits Thoracic Assessment Thoracic Assessment: Within Functional Limits Lumbar  Assessment Lumbar Assessment: Within Functional Limits Postural Control Postural Control: Within Functional Limits  Balance Balance Balance Assessed: Yes Dynamic Sitting Balance Dynamic Sitting - Balance Support: Feet supported;During functional activity;No upper extremity supported(Bathing on TTB) Dynamic Sitting - Level of Assistance: 6: Modified independent (Device/Increase time) Dynamic Standing Balance Dynamic Standing - Balance Support: During functional activity;No upper extremity supported Dynamic Standing - Level of Assistance: 6: Modified independent (Device/Increase time) Dynamic Standing - Balance Activities: Lateral lean/weight shifting;Forward lean/weight shifting;Reaching for objects;Reaching for weighted objects Dynamic Standing - Comments: Gathering clothing items with RW. Able to lift and move his weighted leather jacket without LOB LUE Assessment LUE Assessment: (5/5 proximal to distal) (5/5 proximal to distal)  R UE Assessment:  R UE Assessment: 4-/5 shoulder flexion, 4+/5 elbow flexion, 4-/5 tricep extension    David Mosley David Mosley 11/01/2018, 10:23 AM

## 2018-11-01 NOTE — Progress Notes (Signed)
Occupational Therapy Session Note  Patient Details  Name: David Mosley MRN: [id number] Date of Birth: [birth year]  Today's Date: 11/01/2018 OT Concurrent Time: 1300-1400 OT Concurrent Time Calculation (min): 60 min   Short Term Goals: Week 1:  OT Short Term Goal 1 (Week 1): Pt will complete toilet transer wiht min A OT Short Term Goal 2 (Week 1): Pt will advance pants past hips wiht A for balance only to demo improved use of RUE functionally OT Short Term Goal 3 (Week 1): Pt will don B socks with supervision/VC PRN OT Short Term Goal 4 (Week 1): Pt will complete oral care with RUE and no foam handle  Skilled Therapeutic Interventions/Progress Updates:    1:1. Pt ambulates to/from all tx destinations with supervision and VC for looking forward. Pt with 1 LOB anteriorly as pt was, "distracting myself by picking my nose" and let RW too far in front. Pt able to use anterior stepping strategy to right himself without A. Pt completes standing tolerance/balance activity with S standing on floor and compliant surface while completing BUE [place] task using scissors and tying knots with BUE for RUE NMR. Pt requires VC for forced use of RUE to loop fabric around instead of using LUE for increased challenge. Exited session with pt seated in recliener, call light in reach and all needs met  Therapy Documentation Precautions:  Precautions Precautions: Fall Precaution Comments: R hemiparesis Restrictions Weight Bearing Restrictions: No  Therapy/Group: Individual Therapy  Tonny Branch 11/01/2018, 3:20 PM

## 2018-11-02 DIAGNOSIS — I10 Essential (primary) hypertension: Secondary | ICD-10-CM

## 2018-11-02 DIAGNOSIS — N179 Acute kidney failure, unspecified: Secondary | ICD-10-CM

## 2018-11-02 DIAGNOSIS — E871 Hypo-osmolality and hyponatremia: Secondary | ICD-10-CM

## 2018-11-02 LAB — BASIC METABOLIC PANEL
Anion gap: 8 (ref 5–15)
BUN: 21 mg/dL (ref 8–23)
CO2: 22 mmol/L (ref 22–32)
Calcium: 9.3 mg/dL (ref 8.9–10.3)
Chloride: 103 mmol/L (ref 98–111)
Creatinine, Ser: 1.37 mg/dL — ABNORMAL HIGH (ref 0.61–1.24)
GFR calc Af Amer: >60 mL/min (ref >60)
GFR calc non Af Amer: 54 mL/min — ABNORMAL LOW (ref >60)
Glucose, Bld: 101 mg/dL — ABNORMAL HIGH (ref 70–99)
Potassium: 4.2 mmol/L (ref 3.5–5.1)
Sodium: 133 mmol/L — ABNORMAL LOW (ref 135–145)

## 2018-11-02 NOTE — Plan of Care (Signed)
  Problem: Consults Goal: RH STROKE PATIENT EDUCATION Description See Patient Education module for education specifics  Outcome: Completed/Met   Problem: RH BOWEL ELIMINATION Goal: RH STG MANAGE BOWEL WITH ASSISTANCE Description STG Manage Bowel with Mod I Assistance.  Outcome: Completed/Met Goal: RH STG MANAGE BOWEL W/MEDICATION W/ASSISTANCE Description STG Manage Bowel with Medication with Mod I Assistance.  Outcome: Completed/Met   Problem: RH BLADDER ELIMINATION Goal: RH STG MANAGE BLADDER WITH ASSISTANCE Description STG Manage Bladder With Mod I Assistance  Outcome: Completed/Met   Problem: RH SKIN INTEGRITY Goal: RH STG SKIN FREE OF INFECTION/BREAKDOWN Description No new breakdown with min assist \   Outcome: Completed/Met Goal: RH STG MAINTAIN SKIN INTEGRITY WITH ASSISTANCE Description STG Maintain Skin Integrity With Mod  I Assistance.  Outcome: Completed/Met   Problem: RH SAFETY Goal: RH STG ADHERE TO SAFETY PRECAUTIONS W/ASSISTANCE/DEVICE Description STG Adhere to Safety Precautions With Mod I Assistance/Device.  Outcome: Completed/Met   Problem: RH PAIN MANAGEMENT Goal: RH STG PAIN MANAGED AT OR BELOW PT'S PAIN GOAL Description <3 out of 10.   Outcome: Completed/Met

## 2018-11-02 NOTE — Progress Notes (Signed)
Discharge instructions went over with patient and patient's sister Kara Meadmma. All questions answered. Stoke prophylaxis, diet management and medication compliance reinforced with patient and family. Lucy to follow up with patient on Monday regarding medicaid paperwork. Patient has walker at bedside. Patient denies any pain. Patient discharged to home at 1032.

## 2018-11-02 NOTE — Progress Notes (Signed)
[facility] PHYSICAL MEDICINE & REHABILITATION PROGRESS NOTE   Subjective/Complaints: Patient seen sitting up in his chair dressed eager for discharge.  He states he slept well overnight.  He has questions regarding because of his stroke.  ROS: Denies CP, SOB, N/V/D  Objective:   No results found. No results for input(s): WBC, HGB, HCT, PLT in the last 72 hours. Recent Labs    11/02/18 0501  NA 133*  K 4.2  CL 103  CO2 22  GLUCOSE 101*  BUN 21  CREATININE 1.37*  CALCIUM 9.3    Intake/Output Summary (Last 24 hours) at 11/02/2018 1412 Last data filed at 11/01/2018 1730 Gross per 24 hour  Intake 240 ml  Output -  Net 240 ml     Physical Exam: Vital Signs Blood pressure (!) 144/87, pulse (!) 59, temperature 97.8 F (36.6 C), resp. rate 17, height 5\' 8"  (1.727 m), weight 93 kg, SpO2 95 %.   Constitutional: No distress . Vital signs reviewed. HENT: Normocephalic.  Atraumatic. Eyes: EOMI. No discharge. Cardiovascular: RRR. No JVD. Respiratory: CTA Bilaterally. Normal effort. GI: BS +. Non-distended. Musc: No edema or tenderness in extremities. Neurologic: LUE/LLE: Grossly 5/5 proximal distal RUE/RLE: Grossly 4+/5 proximal distal Skin: Warm and dry.  Intact. Assessment/Plan: 1. 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  Physiatrist is providing close team supervision and 24 hour management of active medical problems listed below.  Physiatrist and rehab team continue to assess barriers to discharge/monitor patient progress toward functional and medical goals  Care Tool:  Bathing    Body parts bathed by patient: Right arm, Left arm, Chest, Abdomen, Front perineal area, Buttocks, Right upper leg, Left upper leg, Left lower leg, Right lower leg, Face         Bathing assist Assist Level: Independent with  assistive device     Upper Body Dressing/Undressing Upper body dressing   What is the patient wearing?: Pull over shirt    Upper body assist Assist Level: Independent with assistive device    Lower Body Dressing/Undressing Lower body dressing      What is the patient wearing?: Underwear/pull up, Pants     Lower body assist Assist for lower body dressing: Independent with assitive device     Toileting Toileting    Toileting assist Assist for toileting: Independent with assistive device     Transfers Chair/bed transfer  Transfers assist     Chair/bed transfer assist level: Independent with assistive device     Locomotion Ambulation   Ambulation assist      Assist level: Independent with assistive device Assistive device: Walker-rolling Max distance: 300   Walk 10 feet activity   Assist     Assist level: Independent with assistive device Assistive device: Walker-rolling   Walk 50 feet activity   Assist    Assist level: Independent with assistive device Assistive device: Walker-rolling    Walk 150 feet activity   Assist    Assist level: Independent with assistive device Assistive device: Walker-rolling    Walk 10 feet on uneven surface  activity   Assist     Assist level: Independent with assistive device Assistive device: PhotographerWalker-rolling   Wheelchair     Assist Will patient use wheelchair at discharge?: No Type of Wheelchair: Manual    Wheelchair assist level: Minimal Assistance - Patient > 75% Max wheelchair distance: 100  Wheelchair 50 feet with 2 turns activity    Assist        Assist Level: Minimal Assistance - Patient > 75%   Wheelchair 150 feet activity     Assist Wheelchair 150 feet activity did not occur: Safety/medical concerns         Medical Problem List and Plan: 1.Functional deficitssecondary toLeft internal capsule/basal ganglia stroke.  D/c today  -Patient to see Rehab  MD/provider in the office for transitional care encounter in 1-2 weeks.  2. DVT Prophylaxis/Anticoagulation: Pharmaceutical:Lovenox 3. Pain Management:Tylenol and ice as needed for right knee pain. Add baclofen as needed for spasms.  -hinged neoprene brace for right knee  -AFO assisting gait. Examined brace. Seems to be aligned and fitting properly. Needs to make sure foot plate is correctly positioned in shoe.  4. Mood:LCSW to follow for evaluation and support. 5. Neuropsych: This patientiscapable of making decisions on own behalf. 6. Skin/Wound Care:Routine pressure relief measures. Maintain adequate nutrition and hydration status. 7. Fluids/Electrolytes/Nutrition:Monitor I's and O's.  8.Hypertension:Monitor blood pressures twice daily. Continue Cardizem and Metoprolol. Vitals:   11/01/18 2033 11/02/18 0452  BP: (!) 141/75 (!) 144/87  Pulse: (!) 58 (!) 59  Resp: 16 17  Temp: 97.8 F (36.6 C) 97.8 F (36.6 C)  SpO2: 96% 95%   Lightly elevated on 11/16 9. History of PSVT: Monitorheart rate twice daily basis. Continue Cardizem, Metoprolol and Tambocor 10.Tobacco abuse:  continue nicotine patch. 11. Dyslipidemia: on Lipitor. 12. Constipation: Started Miralax.   13.  AKI  Creatinine 1.37 on 11/16, encourage fluids  Follow-up labs as outpatient 14.  Hyponatremia  Sodium 133 on 11/16    Labs as outpatient   LOS: 8 days A FACE TO FACE EVALUATION WAS PERFORMED  David Mosley Karis Juba 11/02/2018, 2:12 PM

## 2018-11-05 ENCOUNTER — Telehealth: Payer: Self-pay

## 2018-11-05 NOTE — Telephone Encounter (Signed)
Transitional Care call-patient   1. Are you/is patient experiencing any problems since coming home? No Are there any questions regarding any aspect of care? No 2. Are there any questions regarding medications administration/dosing? No Are meds being taken as prescribed? Yes Patient should review meds with caller to confirm 3. Have there been any falls? No, balance off but no falls 4. Has Home Health been to the house and/or have they contacted you? Yes If not, have you tried to contact them? Can we help you contact them? 5. Are bowels and bladder emptying properly? Yes Are there any unexpected incontinence issues? No If applicable, is patient following bowel/bladder programs? 6. Any fevers, problems with breathing, unexpected pain? No 7. Are there any skin problems or new areas of breakdown? No 8. Has the patient/family member arranged specialty MD follow up (ie cardiology/neurology/renal/surgical/etc)? Yes Can we help arrange? 9. Does the patient need any other services or support that we can help arrange? No 10. Are caregivers following through as expected in assisting the patient? Yes 11. Has the patient quit smoking, drinking alcohol, or using drugs as recommended? Yes  Appointment time, arrive time and who it is with here [address] [phone]

## 2018-11-12 ENCOUNTER — Encounter: Payer: Self-pay | Admitting: Registered Nurse

## 2018-11-12 ENCOUNTER — Encounter: Payer: Medicaid Other | Attending: Registered Nurse | Admitting: Registered Nurse

## 2018-11-12 VITALS — BP 158/82 | HR 60 | Resp 14 | Ht 68.0 in | Wt 219.0 lb

## 2018-11-12 DIAGNOSIS — H538 Other visual disturbances: Secondary | ICD-10-CM | POA: Diagnosis not present

## 2018-11-12 DIAGNOSIS — I639 Cerebral infarction, unspecified: Secondary | ICD-10-CM

## 2018-11-12 DIAGNOSIS — F17210 Nicotine dependence, cigarettes, uncomplicated: Secondary | ICD-10-CM | POA: Insufficient documentation

## 2018-11-12 DIAGNOSIS — I6350 Cerebral infarction due to unspecified occlusion or stenosis of unspecified cerebral artery: Secondary | ICD-10-CM | POA: Diagnosis not present

## 2018-11-12 DIAGNOSIS — I10 Essential (primary) hypertension: Secondary | ICD-10-CM

## 2018-11-12 DIAGNOSIS — I471 Supraventricular tachycardia: Secondary | ICD-10-CM | POA: Diagnosis not present

## 2018-11-12 DIAGNOSIS — Z720 Tobacco use: Secondary | ICD-10-CM

## 2018-11-12 NOTE — Progress Notes (Signed)
Subjective:    Patient ID: David Mosley, male    DOB: 10/23/1956, 62 y.o.   MRN: [id number]  HPI: David NewcomerDonnie Watkinson is a 62 y.o. male who is here for Transitional Care Visit in follow up of his Embolic Stroke of Left Basal Ganglia, Hypertension, PSVT and Tobacco Abuse. Also reports he had blurry vision prior to stroke and  blurry vision remains he's scheduled to follow up with Neurology, he verbalizes understanding. Also instructed to call office with any vision changes he verbalizes understanding.   He has been home with Home Health Therapies with [facility].  Also reports occassional headache and leg spasms. Today he rates his pain 0. Also states he has a good appetite.     Pain Inventory Average Pain 0 Pain Right Now 0 My pain is no pain  In the last 24 hours, has pain interfered with the following? General activity 0 Relation with others 0 Enjoyment of life 0 What TIME of day is your pain at its worst? no pain Sleep (in general) Poor  Pain is worse with: no pain Pain improves with: no pain Relief from Meds: no pain  Mobility walk with assistance use a walker ability to climb steps?  no do you drive?  no Do you have any goals in this area?  yes  Function disabled: date disabled . Do you have any goals in this area?  yes  Neuro/Psych weakness numbness trouble walking spasms  Prior Studies transitional care  Physicians involved in your care transitional care   Family History  Problem Relation Age of Onset  . Alzheimer's disease Mother   . Prostate cancer Father   . Arrhythmia Sister    Social History   Socioeconomic History  . Marital status: Divorced    Spouse name: Not on file  . Number of children: Not on file  . Years of education: Not on file  . Highest education level: Not on file  Occupational History  . Not on file  Social Needs  . Financial resource strain: Not on file  . Food insecurity:    Worry: Not on file    Inability: Not on file   . Transportation needs:    Medical: Not on file    Non-medical: Not on file  Tobacco Use  . Smoking status: Current Every Day Smoker    Packs/day: 0.25    Years: 39.00    Pack years: 9.75    Types: Cigarettes  . Smokeless tobacco: Never Used  Substance and Sexual Activity  . Alcohol use: No    Comment: previous heavy drinker. quit 2001  . Drug use: No    Types: Marijuana  . Sexual activity: Not on file  Lifestyle  . Physical activity:    Days per week: Not on file    Minutes per session: Not on file  . Stress: Not on file  Relationships  . Social connections:    Talks on phone: Not on file    Gets together: Not on file    Attends religious service: Not on file    Active member of club or organization: Not on file    Attends meetings of clubs or organizations: Not on file    Relationship status: Not on file  Other Topics Concern  . Not on file  Social History Narrative  . Not on file   Past Surgical History:  Procedure Laterality Date  . NO PAST SURGERIES     Past Medical History:  Diagnosis  Date  . Hypertension   . Hypothyroidism   . Meningitis   . Pneumonia   . PSVT (paroxysmal supraventricular tachycardia) (HCC)   . PVD (peripheral vascular disease) (HCC)   . Tobacco use    BP (!) 168/81   Pulse (!) 58   Resp 14   Ht 5\' 8"  (1.727 m)   Wt 219 lb (99.3 kg)   SpO2 94%   BMI 33.30 kg/m   Opioid Risk Score:   Fall Risk Score:  `1  Depression screen PHQ 2/9  No flowsheet data found. Review of Systems  Constitutional: Negative.   HENT: Positive for hearing loss.   Eyes: Positive for visual disturbance.  Cardiovascular: Negative.   Gastrointestinal: Negative.   Endocrine:       High blood sugar  Genitourinary: Negative.   Musculoskeletal: Positive for gait problem.       Spasms   Skin: Negative.   Allergic/Immunologic: Negative.   Neurological: Positive for weakness and numbness.  Hematological: Bruises/bleeds easily.  Psychiatric/Behavioral:  Negative.   All other systems reviewed and are negative.      Objective:   Physical Exam  Constitutional: He is oriented to person, place, and time. He appears well-developed and well-nourished.  HENT:  Head: Normocephalic and atraumatic.  Neck: Normal range of motion. Neck supple.  Cardiovascular: Normal rate and regular rhythm.  Pulmonary/Chest: Effort normal and breath sounds normal.  Musculoskeletal:  Normal Muscle Bulk and Muscle Testing Reveals:  Upper Extremities: Right Decreased  ROM 45 Degrees and Muscle Strength 4/5 Left: Full ROM and Muscle Strength 5/5 Lower Extremities: Right: Decreased ROM and Muscle Strength 3/5 Left: Full ROM and Muscle Strength 5/5 Arises from Table Slowly using walker for support.     Neurological: He is alert and oriented to person, place, and time.  Skin: Skin is warm and dry.  Psychiatric: He has a normal mood and affect. His behavior is normal.  Nursing note and vitals reviewed.         Assessment & Plan:  1. Embolic Stroke of Left Basal Ganglia: Continue Home Health Therapies with [facility]. Has a scheduled appointment with neurology  2. Blurry Vision: F/U with Neurology , Call office with any vision changes, he verbalizes understanding.  3. HTN: Continue current medication regimen. PCP Following. 4. SVT: Continue to Monitor: PCP following.  5. Tobacco Abuse: Educated on Smoking Cessation, he verbalizes understanding.   30 minutes of face to face patient care time was spent during this visit. All questions were encouraged and answered.  F/U in 4- 6 weeks with Dr. Riley Kill

## 2018-11-13 ENCOUNTER — Encounter (HOSPITAL_COMMUNITY): Payer: Self-pay

## 2018-11-13 ENCOUNTER — Other Ambulatory Visit: Payer: Self-pay

## 2018-11-13 ENCOUNTER — Other Ambulatory Visit (HOSPITAL_COMMUNITY)
Admission: RE | Admit: 2018-11-13 | Discharge: 2018-11-13 | Disposition: A | Discharge status: Home / Self Care | Payer: Medicaid Other | Source: Ambulatory Visit | Attending: Physician Assistant | Admitting: Physician Assistant

## 2018-11-13 ENCOUNTER — Emergency Department (HOSPITAL_COMMUNITY)
Admission: EM | Admit: 2018-11-13 | Discharge: 2018-11-13 | Disposition: A | Discharge status: Home / Self Care | Payer: Medicaid Other | Attending: Emergency Medicine | Admitting: Emergency Medicine

## 2018-11-13 DIAGNOSIS — E039 Hypothyroidism, unspecified: Secondary | ICD-10-CM | POA: Insufficient documentation

## 2018-11-13 DIAGNOSIS — Y929 Unspecified place or not applicable: Secondary | ICD-10-CM | POA: Diagnosis not present

## 2018-11-13 DIAGNOSIS — W010XXA Fall on same level from slipping, tripping and stumbling without subsequent striking against object, initial encounter: Secondary | ICD-10-CM | POA: Diagnosis not present

## 2018-11-13 DIAGNOSIS — S51811A Laceration without foreign body of right forearm, initial encounter: Secondary | ICD-10-CM | POA: Insufficient documentation

## 2018-11-13 DIAGNOSIS — I10 Essential (primary) hypertension: Secondary | ICD-10-CM

## 2018-11-13 DIAGNOSIS — F17210 Nicotine dependence, cigarettes, uncomplicated: Secondary | ICD-10-CM | POA: Insufficient documentation

## 2018-11-13 DIAGNOSIS — Z8673 Personal history of transient ischemic attack (TIA), and cerebral infarction without residual deficits: Secondary | ICD-10-CM | POA: Diagnosis not present

## 2018-11-13 DIAGNOSIS — E785 Hyperlipidemia, unspecified: Secondary | ICD-10-CM | POA: Insufficient documentation

## 2018-11-13 DIAGNOSIS — Y998 Other external cause status: Secondary | ICD-10-CM | POA: Insufficient documentation

## 2018-11-13 DIAGNOSIS — Y9389 Activity, other specified: Secondary | ICD-10-CM | POA: Diagnosis not present

## 2018-11-13 HISTORY — DX: Cerebral infarction, unspecified: I63.9

## 2018-11-13 LAB — LIPID PANEL
Cholesterol: 116 mg/dL (ref 0–200)
HDL: 35 mg/dL — AB (ref >40)
LDL CALC: 64 mg/dL (ref 0–99)
Total CHOL/HDL Ratio: 3.3 RATIO
Triglycerides: 84 mg/dL (ref <150)
VLDL: 17 mg/dL (ref 0–40)

## 2018-11-13 LAB — COMPREHENSIVE METABOLIC PANEL
ALBUMIN: 4 g/dL (ref 3.5–5.0)
ALK PHOS: 40 U/L (ref 38–126)
ALT: 36 U/L (ref 0–44)
ANION GAP: 6 (ref 5–15)
AST: 24 U/L (ref 15–41)
BUN: 11 mg/dL (ref 8–23)
CALCIUM: 9.3 mg/dL (ref 8.9–10.3)
CO2: 27 mmol/L (ref 22–32)
Chloride: 104 mmol/L (ref 98–111)
Creatinine, Ser: 1.24 mg/dL (ref 0.61–1.24)
GFR calc non Af Amer: >60 mL/min (ref >60)
Glucose, Bld: 94 mg/dL (ref 70–99)
Potassium: 3.9 mmol/L (ref 3.5–5.1)
SODIUM: 137 mmol/L (ref 135–145)
TOTAL PROTEIN: 7.6 g/dL (ref 6.5–8.1)
Total Bilirubin: 0.7 mg/dL (ref 0.3–1.2)

## 2018-11-13 LAB — TSH: TSH: 6.641 u[IU]/mL — ABNORMAL HIGH (ref 0.350–4.500)

## 2018-11-13 MED ORDER — BACITRACIN ZINC 500 UNIT/GM EX OINT
TOPICAL_OINTMENT | CUTANEOUS | Status: AC
  Administered 2018-11-13: 3
  Filled 2018-11-13: qty 2.7

## 2018-11-13 MED ORDER — POVIDONE-IODINE 10 % EX SOLN
CUTANEOUS | Status: AC
  Administered 2018-11-13: 15:00:00
  Filled 2018-11-13: qty 15

## 2018-11-13 NOTE — ED Triage Notes (Signed)
Pt reports he had a stroke  3 weeks ago and he was getting up and fell on a piece of wood last night. Pt has areas of dried blood noted to right forearm. Pt is on blood thinners

## 2018-11-13 NOTE — ED Provider Notes (Signed)
[facility] EMERGENCY DEPARTMENT Provider Note   CSN: [id number] Arrival date & time: 11/13/18  1212     History   Chief Complaint Chief Complaint  Patient presents with  . Fall  . skin tear    HPI David Mosley is a 62 y.o. male.  Patient is a 62 year old male with history of recent CVA currently taking blood thinners.  He presents for evaluation of right forearm injury.  He fell yesterday evening while putting a log in the fire.  He caused abrasions to his right forearm that continued to bleed throughout the night.  The bleeding has stopped, however he has dried blood on his arm and the wife was worried about cleaning it due to the blood thinners.  The history is provided by the patient.  Fall  This is a new problem. Episode onset: Last night. The problem occurs constantly. The problem has not changed since onset.Nothing aggravates the symptoms. Nothing relieves the symptoms. He has tried nothing for the symptoms.    Past Medical History:  Diagnosis Date  . Hypertension   . Hypothyroidism   . Meningitis   . Pneumonia   . PSVT (paroxysmal supraventricular tachycardia) (HCC)   . PVD (peripheral vascular disease) (HCC)   . Stroke (HCC)   . Tobacco use     Patient Active Problem List   Diagnosis Date Noted  . AKI (acute kidney injury) (HCC)   . Hyponatremia   . Essential hypertension   . Embolic stroke (HCC) 10/25/2018  . CVA (cerebral vascular accident) (HCC) 10/23/2018  . Wide-complex tachycardia (HCC) 03/28/2018  . Hypothyroidism 03/28/2018  . Elevated AST (SGOT) 03/28/2018  . Chest pain 03/28/2018  . Tobacco use 03/28/2018  . Sleep apnea 03/28/2018  . Paroxysmal ventricular tachycardia (HCC)   . PSVT (paroxysmal supraventricular tachycardia) (HCC) 03/26/2018  . Essential hypertension, benign 05/29/2016  . Thyroid activity decreased 03/21/2016  . Edema 03/21/2016  . PVD (peripheral vascular disease) (HCC) 03/21/2016  . Cigarette nicotine dependence with  nicotine-induced disorder 03/21/2016    Past Surgical History:  Procedure Laterality Date  . NO PAST SURGERIES          Home Medications    Prior to Admission medications   Medication Sig Start Date End Date Taking? Authorizing Provider  acetaminophen (TYLENOL) 325 MG tablet Take 1-2 tablets (325-650 mg total) by mouth every 4 (four) hours as needed for mild pain. 10/30/18   LoveEvlyn Kanner, PA-C  aspirin 81 MG EC tablet Take 1 tablet (81 mg total) by mouth daily. 11/01/18   Love, Evlyn Kanner, PA-C  atorvastatin (LIPITOR) 20 MG tablet Take 1 tablet (20 mg total) by mouth daily. 11/01/18   Love, Evlyn Kanner, PA-C  camphor-menthol [facility]) lotion Apply topically 3 (three) times daily with meals. 11/01/18   Love, Evlyn Kanner, PA-C  clopidogrel (PLAVIX) 75 MG tablet Take 1 tablet (75 mg total) by mouth daily. 11/01/18   Love, Evlyn Kanner, PA-C  diltiazem (CARDIZEM CD) 180 MG 24 hr capsule Take 1 capsule (180 mg total) by mouth daily. 11/01/18   Love, Evlyn Kanner, PA-C  flecainide (TAMBOCOR) 50 MG tablet Take 1 tablet (50 mg total) by mouth every 12 (twelve) hours. 11/01/18   Love, Evlyn Kanner, PA-C  levothyroxine (SYNTHROID, LEVOTHROID) 100 MCG tablet Take 1 tablet (100 mcg total) by mouth daily. 11/01/18   Love, Evlyn Kanner, PA-C  metoprolol tartrate (LOPRESSOR) 25 MG tablet Take 0.5 tablets (12.5 mg total) by mouth 2 (two) times daily. 11/01/18  Love, Pamela S, PA-C  polyethylene glycol (MIRALAX / GLYCOLAX) packet Take 17 g by mouth daily. 11/01/18   Jacquelynn CreeLove, Pamela S, PA-C    Family History Family History  Problem Relation Age of Onset  . Alzheimer's disease Mother   . Prostate cancer Father   . Arrhythmia Sister     Social History Social History   Tobacco Use  . Smoking status: Current Every Day Smoker    Packs/day: 0.25    Years: 39.00    Pack years: 9.75    Types: Cigarettes  . Smokeless tobacco: Never Used  Substance Use Topics  . Alcohol use: No    Comment: previous heavy drinker. quit 2001   . Drug use: No    Types: Marijuana     Allergies   Amlodipine and Penicillins   Review of Systems Review of Systems  All other systems reviewed and are negative.    Physical Exam Updated Vital Signs BP (!) 160/88 (BP Location: Right Arm)   Pulse 60   Temp 97.9 F (36.6 C) (Oral)   Resp 16   Ht 5\' 8"  (1.727 m)   Wt 90.7 kg   SpO2 99%   BMI 30.41 kg/m   Physical Exam  Constitutional: He is oriented to person, place, and time. He appears well-developed and well-nourished. No distress.  HENT:  Head: Normocephalic and atraumatic.  Neck: Normal range of motion. Neck supple.  Pulmonary/Chest: Effort normal.  Musculoskeletal: Normal range of motion.  The right forearm has small skin tears with dried blood.  There is no active bleeding.  There is no redness or discoloration.  He has full range of motion of the elbow and wrist and motor and sensation are intact throughout the fingers.  Ulnar and radial pulses are easily palpable.  Neurological: He is alert and oriented to person, place, and time.  Skin: Skin is warm and dry. He is not diaphoretic.  Nursing note and vitals reviewed.    ED Treatments / Results  Labs (all labs ordered are listed, but only abnormal results are displayed) Labs Reviewed - No data to display  EKG None  Radiology No results found.  Procedures Procedures (including critical care time)  Medications Ordered in ED Medications - No data to display   Initial Impression / Assessment and Plan / ED Course  I have reviewed the triage vital signs and the nursing notes.  Pertinent labs & imaging results that were available during my care of the patient were reviewed by me and considered in my medical decision making (see chart for details).  The patient's wounds were cleaned and dressed.  There is no indication for suture or imaging studies.  He will be discharged with local wound care and return as needed.  Final Clinical Impressions(s) / ED  Diagnoses   Final diagnoses:  None    ED Discharge Orders    None       Geoffery Lyonselo, Chessica Audia, MD 11/13/18 1428

## 2018-11-13 NOTE — Discharge Instructions (Addendum)
Local wound care with bacitracin and dressing changes twice daily.  Return to the emergency department if you develop redness surrounding the wound, pus draining from the wound, streaks up and down the arm, or other new and concerning symptoms.

## 2018-11-18 ENCOUNTER — Ambulatory Visit: Payer: Self-pay | Admitting: Physician Assistant

## 2018-11-18 ENCOUNTER — Encounter: Payer: Self-pay | Admitting: Physician Assistant

## 2018-11-18 VITALS — BP 157/84 | HR 65 | Temp 97.9°F | Ht 68.0 in

## 2018-11-18 DIAGNOSIS — I10 Essential (primary) hypertension: Secondary | ICD-10-CM

## 2018-11-18 DIAGNOSIS — Z550 Illiteracy and low-level literacy: Secondary | ICD-10-CM

## 2018-11-18 DIAGNOSIS — Z8673 Personal history of transient ischemic attack (TIA), and cerebral infarction without residual deficits: Secondary | ICD-10-CM

## 2018-11-18 DIAGNOSIS — E039 Hypothyroidism, unspecified: Secondary | ICD-10-CM

## 2018-11-18 DIAGNOSIS — E785 Hyperlipidemia, unspecified: Secondary | ICD-10-CM

## 2018-11-18 DIAGNOSIS — R000 Tachycardia, unspecified: Secondary | ICD-10-CM

## 2018-11-18 DIAGNOSIS — N189 Chronic kidney disease, unspecified: Secondary | ICD-10-CM

## 2018-11-18 DIAGNOSIS — I472 Ventricular tachycardia: Secondary | ICD-10-CM

## 2018-11-18 MED ORDER — LOSARTAN POTASSIUM 50 MG PO TABS: 50.0000 mg | ORAL_TABLET | Freq: Every day | ORAL | 3 refills | Status: DC

## 2018-11-18 MED ORDER — LEVOTHYROXINE SODIUM 100 MCG PO TABS: 100.0000 ug | ORAL_TABLET | Freq: Every day | ORAL | 4 refills | Status: AC

## 2018-11-18 MED ORDER — ATORVASTATIN CALCIUM 20 MG PO TABS: 20.0000 mg | ORAL_TABLET | Freq: Every day | ORAL | 3 refills | Status: AC

## 2018-11-18 NOTE — Patient Instructions (Addendum)
Bring in your 1040 tax form for year 2018 to complete your  MedAssist application

## 2018-11-18 NOTE — Progress Notes (Signed)
BP (!) 157/84 (BP Location: Right Arm, Patient Position: Sitting, Cuff Size: Normal)   Pulse 65   Temp 97.9 F (36.6 C)   Ht 5\' 8"  (1.727 m)   SpO2 93%   BMI 30.41 kg/m    Subjective:    Patient ID: David Mosley, male    DOB: May 07, 1956, 62 y.o.   MRN: [id number]  HPI: David Mosley is a 62 y.o. male presenting on 11/18/2018 for Hypertension and Wound Check   HPI  Pt was started on losartan for elevated bp at last OV but it was dropped somewhere along the way.  He was admitted to the hospital with CVA since his last OV here.    He says he has finally stopped smoking.    He is feeling okay today.    Relevant past medical, surgical, family and social history reviewed and updated as indicated. Interim medical history since our last visit reviewed. Allergies and medications reviewed and updated.   Current Outpatient Medications:  .  aspirin 81 MG EC tablet, Take 1 tablet (81 mg total) by mouth daily., Disp: 100 tablet, Rfl: 0 .  atorvastatin (LIPITOR) 20 MG tablet, Take 1 tablet (20 mg total) by mouth daily., Disp: 90 tablet, Rfl: 0 .  diltiazem (CARDIZEM CD) 180 MG 24 hr capsule, Take 1 capsule (180 mg total) by mouth daily., Disp: 30 capsule, Rfl: 0 .  flecainide (TAMBOCOR) 50 MG tablet, Take 1 tablet (50 mg total) by mouth every 12 (twelve) hours., Disp: 60 tablet, Rfl: 0 .  levothyroxine (SYNTHROID, LEVOTHROID) 100 MCG tablet, Take 1 tablet (100 mcg total) by mouth daily., Disp: 30 tablet, Rfl: 0 .  metoprolol tartrate (LOPRESSOR) 25 MG tablet, Take 0.5 tablets (12.5 mg total) by mouth 2 (two) times daily., Disp: 30 tablet, Rfl: 0 .  metoprolol tartrate (LOPRESSOR) 50 MG tablet, Take 0.5 tab (25 mg) by mouth twice daily, Disp: , Rfl:   Review of Systems  Constitutional: Positive for appetite change, chills, diaphoresis and fatigue. Negative for fever and unexpected weight change.  HENT: Negative for congestion, drooling, ear pain, facial swelling, hearing loss, mouth sores,  sneezing, sore throat, trouble swallowing and voice change.   Eyes: Positive for itching and visual disturbance. Negative for pain, discharge and redness.  Respiratory: Positive for shortness of breath. Negative for cough, choking and wheezing.   Cardiovascular: Positive for leg swelling. Negative for chest pain and palpitations.  Gastrointestinal: Negative for abdominal pain, blood in stool, constipation, diarrhea and vomiting.  Endocrine: Negative for cold intolerance, heat intolerance and polydipsia.  Genitourinary: Negative for decreased urine volume, dysuria and hematuria.  Musculoskeletal: Positive for gait problem. Negative for arthralgias and back pain.  Skin: Negative for rash.  Allergic/Immunologic: Positive for environmental allergies.  Neurological: Positive for light-headedness and headaches. Negative for seizures and syncope.  Hematological: Negative for adenopathy.  Psychiatric/Behavioral: Negative for agitation, dysphoric mood and suicidal ideas. The patient is not nervous/anxious.     Per HPI unless specifically indicated above     Objective:    BP (!) 157/84 (BP Location: Right Arm, Patient Position: Sitting, Cuff Size: Normal)   Pulse 65   Temp 97.9 F (36.6 C)   Ht 5\' 8"  (1.727 m)   SpO2 93%   BMI 30.41 kg/m   Wt Readings from Last 3 Encounters:  11/13/18 200 lb (90.7 kg)  11/12/18 219 lb (99.3 kg)  10/30/18 205 lb 0.4 oz (93 kg)    Physical Exam  Constitutional: He is oriented to  person, place, and time. He appears well-developed and well-nourished.  Pt is ambulating with rolling walker  HENT:  Head: Normocephalic and atraumatic.  Neck: Neck supple.  Cardiovascular: Normal rate and regular rhythm.  Pulmonary/Chest: Effort normal and breath sounds normal. He has no wheezes.  Abdominal: Soft. Bowel sounds are normal. There is no hepatosplenomegaly. There is no tenderness.  Musculoskeletal: He exhibits no edema.  Right forearm with bruising from recent  fall.  No swelling or signs infection  Lymphadenopathy:    He has no cervical adenopathy.  Neurological: He is alert and oriented to person, place, and time.  R weakness in extremities. Needs rolling walker to ambulate  Skin: Skin is warm and dry.  Psychiatric: He has a normal mood and affect. His behavior is normal.  Vitals reviewed.   Results for orders placed or performed during the hospital encounter of 11/13/18  Lipid panel  Result Value Ref Range   Cholesterol 116 0 - 200 mg/dL   Triglycerides 84 <010<150 mg/dL   HDL 35 (L) >27>40 mg/dL   Total CHOL/HDL Ratio 3.3 RATIO   VLDL 17 0 - 40 mg/dL   LDL Cholesterol 64 0 - 99 mg/dL  Comprehensive metabolic panel  Result Value Ref Range   Sodium 137 135 - 145 mmol/L   Potassium 3.9 3.5 - 5.1 mmol/L   Chloride 104 98 - 111 mmol/L   CO2 27 22 - 32 mmol/L   Glucose, Bld 94 70 - 99 mg/dL   BUN 11 8 - 23 mg/dL   Creatinine, Ser 2.531.24 0.61 - 1.24 mg/dL   Calcium 9.3 8.9 - 66.410.3 mg/dL   Total Protein 7.6 6.5 - 8.1 g/dL   Albumin 4.0 3.5 - 5.0 g/dL   AST 24 15 - 41 U/L   ALT 36 0 - 44 U/L   Alkaline Phosphatase 40 38 - 126 U/L   Total Bilirubin 0.7 0.3 - 1.2 mg/dL   GFR calc non Af Amer >60 >60 mL/min   GFR calc Af Amer >60 >60 mL/min   Anion gap 6 5 - 15  TSH  Result Value Ref Range   TSH 6.641 (H) 0.350 - 4.500 uIU/mL      Assessment & Plan:    Encounter Diagnoses  Name Primary?  . Essential hypertension Yes  . Unable to read or write   . Hypothyroidism, unspecified type   . Hyperlipidemia, unspecified hyperlipidemia type   . Wide-complex tachycardia (HCC)   . Chronic kidney disease, unspecified CKD stage   . History of recent stroke     -Reviewed labs with pt. -will keep pt on his Metoprolol.  50mg  1/2 table bid  - disposed of his bottle of 25mg  1/2 bid  ???  (was dangerous and confusing particularly in light of the fact that the pt cannot read) -pt's pill botttles are still marked- his BID med bottles have a yellow sticker  on the top and the ones with no stickers are QD meds -Will Re-add losartan 50mg  qd due to bp still too high.  Will monitor renal function -pt has completed his DAPT and is on aspirin alone at this time -congratulated pt on stopping smoking -pt to see cardiology in near future as scheduled -will get pt signed up for medassist to help with cost of medication -pt to follow up here 6 weeks to recheck bp.  RTO sooner prn

## 2018-12-09 ENCOUNTER — Ambulatory Visit: Payer: Self-pay | Admitting: Adult Health

## 2018-12-09 ENCOUNTER — Telehealth: Payer: Self-pay

## 2018-12-09 NOTE — Progress Notes (Deleted)
[facility] [address] [place]. [place] [address] [phone]       OFFICE FOLLOW UP NOTE  [name] Date of Birth:  1956-10-31 Medical Record Number:  [id number]   Reason for Referral:  hospital stroke follow up  CHIEF COMPLAINT:  No chief complaint on file.   HPI: David Mosley is being seen today for initial visit in the office for left internal capsule/BG infarct likely secondary to small vessel disease on 10/23/2018. History obtained from *** and chart review. Reviewed all radiology images and labs personally.  [name] is a 62 y.o. male with history of tobacco abuse, hypothyroidism ,PVD, PSVT, and hypertension  who presented with right sided weakness and numbness. He did not receive IV t-PA due to late presentation.  CT had reviewed and cannot exclude M2/3 branch thrombosis.  MRI brain reviewed and showed left internal capsule/BG infarct with small vessel disease and atrophy.  CTA head and neck showed suspected bulky soft plaque at the left ICA origin and bulb with high-grade left ICA origin stenosis.  Carotid Doppler showed left ICA 40 to 59% stenosis.  2D echo showed an EF of 60 to 65% without cardiac source of embolus identified.  LDL 63 and recommended continuation of atorvastatin 20 mg daily.  Due to left ICA soft plaque, it was recommended to follow-up with vascular surgery outpatient.  Infarct consistent with small vessel disease although etiology of large vessel disease due to left ICA soft plaque could not be ruled out.  It was recommended for DAPT for 3 weeks then aspirin alone as patient was not on antithrombotic PTA.  Patient was discharged to CIR due to continued right hemiparesis.    ROS:   14 system review of systems performed and negative with exception of ***  PMH:  Past Medical History:  Diagnosis Date  . Hypertension   . Hypothyroidism   . Meningitis   . Pneumonia   . PSVT (paroxysmal supraventricular tachycardia) (HCC)   . PVD  (peripheral vascular disease) (HCC)   . Stroke (HCC)   . Tobacco use     PSH:  Past Surgical History:  Procedure Laterality Date  . NO PAST SURGERIES      Social History:  Social History   Socioeconomic History  . Marital status: Divorced    Spouse name: Not on file  . Number of children: Not on file  . Years of education: Not on file  . Highest education level: Not on file  Occupational History  . Not on file  Social Needs  . Financial resource strain: Not on file  . Food insecurity:    Worry: Not on file    Inability: Not on file  . Transportation needs:    Medical: Not on file    Non-medical: Not on file  Tobacco Use  . Smoking status: Former Smoker    Packs/day: 0.25    Years: 39.00    Pack years: 9.75    Types: Cigarettes    Last attempt to quit: 10/2018    Years since quitting: 0.1  . Smokeless tobacco: Never Used  Substance and Sexual Activity  . Alcohol use: No    Comment: previous heavy drinker. quit 2001  . Drug use: No    Types: Marijuana  . Sexual activity: Not on file  Lifestyle  . Physical activity:    Days per week: Not on file    Minutes per session: Not on file  . Stress: Not on file  Relationships  . Social connections:    Talks on phone: Not on file    Gets together: Not on file    Attends religious service: Not on file    Active member of club or organization: Not on file    Attends meetings of clubs or organizations: Not on file    Relationship status: Not on file  . Intimate partner violence:    Fear of current or ex partner: Not on file    Emotionally abused: Not on file    Physically abused: Not on file    Forced sexual activity: Not on file  Other Topics Concern  . Not on file  Social History Narrative  . Not on file    Family History:  Family History  Problem Relation Age of Onset  . Alzheimer's disease Mother   . Prostate cancer Father   . Arrhythmia Sister     Medications:   Current Outpatient Medications on  File Prior to Visit  Medication Sig Dispense Refill  . aspirin 81 MG EC tablet Take 1 tablet (81 mg total) by mouth daily. 100 tablet 0  . atorvastatin (LIPITOR) 20 MG tablet Take 1 tablet (20 mg total) by mouth daily. 30 tablet 3  . diltiazem (CARDIZEM CD) 180 MG 24 hr capsule Take 1 capsule (180 mg total) by mouth daily. 30 capsule 0  . flecainide (TAMBOCOR) 50 MG tablet Take 1 tablet (50 mg total) by mouth every 12 (twelve) hours. 60 tablet 0  . levothyroxine (SYNTHROID, LEVOTHROID) 100 MCG tablet Take 1 tablet (100 mcg total) by mouth daily. 30 tablet 4  . losartan (COZAAR) 50 MG tablet Take 1 tablet (50 mg total) by mouth daily. 30 tablet 3  . metoprolol tartrate (LOPRESSOR) 50 MG tablet Take 0.5 tab (25 mg) by mouth twice daily     No current facility-administered medications on file prior to visit.     Allergies:   Allergies  Allergen Reactions  . Amlodipine Nausea Only, Anxiety and Palpitations  . Penicillins Itching    Has patient had a PCN reaction causing immediate rash, facial/tongue/throat swelling, SOB or lightheadedness with hypotension: No Has patient had a PCN reaction causing severe rash involving mucus membranes or skin necrosis: No Has patient had a PCN reaction that required hospitalization: No Has patient had a PCN reaction occurring within the last 10 years: No\ If all of the above answers are "NO", then may proceed with Cephalosporin use.     Physical Exam  There were no vitals filed for this visit. There is no height or weight on file to calculate BMI. No exam data present  General: well developed, well nourished, seated, in no evident distress Head: head normocephalic and atraumatic.   Neck: supple with no carotid or supraclavicular bruits Cardiovascular: regular rate and rhythm, no murmurs Musculoskeletal: no deformity Skin:  no rash/petichiae Vascular:  Normal pulses all extremities  Neurologic Exam Mental Status: Awake and fully alert. Oriented to  place and time. Recent and remote memory intact. Attention span, concentration and fund of knowledge appropriate. Mood and affect appropriate.  Cranial Nerves: Fundoscopic exam reveals sharp disc margins. Pupils equal, briskly reactive to light. Extraocular movements full without nystagmus. Visual fields full to confrontation. Hearing intact. Facial sensation intact. Face, tongue, palate moves normally and symmetrically.  Motor: Normal bulk and tone. Normal strength in all tested extremity muscles. Sensory.: intact to touch , pinprick , position and vibratory sensation.  Coordination: Rapid alternating movements normal in all extremities. Finger-to-nose  and heel-to-shin performed accurately bilaterally. Gait and Station: Arises from chair without difficulty. Stance is normal. Gait demonstrates normal stride length and balance. Able to heel, toe and tandem walk without difficulty.  Reflexes: 1+ and symmetric. Toes downgoing.    NIHSS  *** Modified Rankin  *** CHA2DS2-VASc *** HAS-BLED ***   Diagnostic Data (Labs, Imaging, Testing)  Ct Angio Head W/cm &/or Wo Cm Ct Angio Neck W And/or Wo Contrast 10/23/2018 IMPRESSION:  1. Negative for large vessel occlusion. No left MCA branch occlusion or stenosis is evident.  2. Suspected bulky soft plaque at the Left ICA origin and bulb with High-Grade Left ICA origin stenosis approaching a RADIOGRAPHIC STRING SIGN. However, substantial venous contrast reflux in the left neck (related to anatomic left innominate vein stenosis) with streak artifact does limit detail of the left carotid. Therefore corroboration of the high-grade stenosis with Carotid Doppler US may be valuable.  3. No other hemodynamically significant stenosis in the head or neck. Generalized arterial tortuosity.  4. Stable CT appearance of the brain since 0846 hours today.   Ct Cerebral Perfusion W Contrast 10/23/2018 IMPRESSION:  Negative CT perfusion - no infarct or penumbra.   Ct  Head Code Stroke Wo Contrast 10/23/2018 IMPRESSION:  1. No hemorrhage or visible infarct.ASPECTS is 10.  2. Equivocal hyperdense focus in the left sylvian fissure, cannot exclude M2/3 branch thrombosis.   Mr [name] Contrast 10/24/2018 1. Moderately motion degraded examination. Acute nonhemorrhagic LEFT internal capsule/basal ganglia infarct. 2. Moderate parenchymal brain volume loss, advanced for age. 3. Mild chronic small vessel ischemic changes.   Dg Knee Right Port 10/25/2018 No acute osseous abnormality. Small moderate knee effusion with arthritis.   Transthoracic Echocardiogram  - Left ventricle: The cavity size was normal. Systolic function wasnormal. The estimated ejection fraction was in the range of 60%to 65%. Wall motion was normal; there were no regional wallmotion abnormalities. Left ventricular diastolic functionparameters were normal. - Aortic valve: There was no significant regurgitation. - Mitral valve: There was trivial regurgitation. - Right ventricle: Systolic function was low normal. - Atrial septum: No defect or patent foramen ovale was identified. - Tricuspid valve: There was trivial regurgitation. Impressions: - No cardiac source of emboli was indentified.  Bilateral Carotid Dopplers 1-39% right ICA stenosis. 40-59% left ICA stenosis. Vertebral artery flow is antegrade.    ASSESSMENT: David Mosley is a 62 y.o. year old male here with left internal capsule/BG infarct on 10/23/2018 secondary to small vessel disease. Vascular risk factors include HTN, HLD, PVD, PSVT and tobacco use.Marland Kitchen.     PLAN:  1. Left internal capsule/BG infarct: Continue {anticoagulants:31417}  and ***  for secondary stroke prevention. Maintain strict control of hypertension with blood pressure goal below 130/90, diabetes with hemoglobin A1c goal below 6.5% and cholesterol with LDL cholesterol (bad cholesterol) goal below 70 mg/dL.  I also advised the patient to eat a healthy diet with  plenty of whole grains, cereals, fruits and vegetables, exercise regularly with at least 30 minutes of continuous activity daily and maintain ideal body weight. 2. HTN: Advised to continue current treatment regimen.  Today's BP ***.  Advised to continue to monitor at home along with continued follow-up with PCP for management 3. HLD: Advised to continue current treatment regimen along with continued follow-up with PCP for future prescribing and monitoring of lipid panel 4. Tobacco use:  5. Left ICA soft plaque: Please follow with vascular surgery Dr. Arbie CookeyEarly tomorrow for monitoring and management     Follow up in ***  or call earlier if needed   Greater than 50% of time during this 25 minute visit was spent on counseling, explanation of diagnosis of left BG capsule/BG infarct, reviewing risk factor management of HTN, HLD and tobacco use, planning of further management along with potential future management, and discussion with patient and family answering all questions.    George HughJessica Sam Overbeck, AGNP-BC  [facility] [address] [place], [place] [id number]  Phone [phone] Fax [phone] Note: This document was prepared with digital dictation and possible smart phrase technology. Any transcriptional errors that result from this process are unintentional.

## 2018-12-09 NOTE — Telephone Encounter (Signed)
Patient no show for appt today. 

## 2018-12-10 ENCOUNTER — Encounter: Payer: Self-pay | Admitting: Vascular Surgery

## 2018-12-12 ENCOUNTER — Encounter: Payer: Self-pay | Admitting: Adult Health

## 2018-12-16 ENCOUNTER — Other Ambulatory Visit: Payer: Self-pay | Admitting: Physician Assistant

## 2018-12-16 MED ORDER — DILTIAZEM HCL ER COATED BEADS 180 MG PO CP24: 180.0000 mg | ORAL_CAPSULE | Freq: Every day | ORAL | 1 refills | Status: DC

## 2018-12-20 ENCOUNTER — Encounter: Payer: Self-pay | Admitting: Student

## 2018-12-20 ENCOUNTER — Ambulatory Visit (INDEPENDENT_AMBULATORY_CARE_PROVIDER_SITE_OTHER): Payer: Self-pay | Admitting: Student

## 2018-12-20 VITALS — BP 124/84 | HR 67 | Ht 68.0 in | Wt 228.0 lb

## 2018-12-20 DIAGNOSIS — I10 Essential (primary) hypertension: Secondary | ICD-10-CM

## 2018-12-20 DIAGNOSIS — Z720 Tobacco use: Secondary | ICD-10-CM

## 2018-12-20 DIAGNOSIS — I472 Ventricular tachycardia, unspecified: Secondary | ICD-10-CM

## 2018-12-20 DIAGNOSIS — Z8673 Personal history of transient ischemic attack (TIA), and cerebral infarction without residual deficits: Secondary | ICD-10-CM

## 2018-12-20 DIAGNOSIS — I471 Supraventricular tachycardia: Secondary | ICD-10-CM

## 2018-12-20 MED ORDER — FLECAINIDE ACETATE 50 MG PO TABS: 50.0000 mg | ORAL_TABLET | Freq: Two times a day (BID) | ORAL | 3 refills | Status: AC

## 2018-12-20 MED ORDER — DILTIAZEM HCL ER COATED BEADS 180 MG PO CP24: 180.0000 mg | ORAL_CAPSULE | Freq: Every day | ORAL | 11 refills | Status: AC

## 2018-12-20 NOTE — Patient Instructions (Signed)
Medication Instructions:  Your physician recommends that you continue on your current medications as directed. Please refer to the Current Medication list given to you today.  If you need a refill on your cardiac medications before your next appointment, please call your pharmacy.   Lab work: NONE  If you have labs (blood work) drawn today and your tests are completely normal, you will receive your results only by: Marland Kitchen. MyChart Message (if you have MyChart) OR . A paper copy in the mail If you have any lab test that is abnormal or we need to change your treatment, we will call you to review the results.  Testing/Procedures: NONE   Follow-Up: At [facility], you and your health needs are our priority.  As part of our continuing mission to provide you with exceptional heart care, we have created designated Provider Care Teams.  These Care Teams include your primary Cardiologist (physician) and Advanced Practice Providers (APPs -  Physician Assistants and Nurse Practitioners) who all work together to provide you with the care you need, when you need it. You will need a follow up appointment in 2-3  months.  Please call our office 2 months in advance to schedule this appointment.  You may see Lewayne BuntingGregg Taylor, MD or one of the following Advanced Practice Providers on your designated Care Team:   Randall AnBrittany Strader, PA-C [facility]([place] Office) . Jacolyn ReedyMichele Lenze, PA-C [facility]([place] Office)  Any Other Special Instructions Will Be Listed Below (If Applicable). Thank you for choosing  [facility]!

## 2018-12-20 NOTE — Progress Notes (Signed)
Cardiology Office Note    Date:  12/21/2018   ID:  David Mosley, DOB 10/25/1956, MRN [id number]  PCP:  Jacquelin Hawking, PA-C  Cardiologist: Lewayne Bunting, MD    Chief Complaint  Patient presents with  . Follow-up    62-month visit    History of Present Illness:    David Mosley is a 63 y.o. 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  He was admitted to [facility] in 03/2018 and had bursts of pSVT along with wide-complex tachycardia by review of telemetry at that time. Echocardiogram showed a preserved EF of 55% with no regional wall motion abnormalities. He was evaluated by EP during admission and was started on Flecainide 50 mg twice daily and continued on Cardizem CD at 180 mg daily. He did follow-up with Dr. 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  In the interim, he was admitted to [facility] in 10/2018 for acute right-sided hemi-paresis concerning for a CVA.  MRI of the brain showed acute nonhemorrhagic left internal capsule/basal ganglia infarcts and moderate parenchymal brain volume loss. Was evaluated by Neurology who recommended DAPT with ASA and Plavix for 3 weeks followed by ASA alone. He was initially discharged to [facility] and discharged home on 11/02/2018.  In talking with the patient today, he reports that he has residual right-sided weakness from his recent CVA but this continues to improve. He was previously working with home health PT but they are no longer coming out due to his number of sessions having expired. He continues to do exercises on his own to improve his strength.  He reports that his  palpitations have significantly improved since being started on Cardizem CD and Flecainide last spring. He did miss several days of Cardizem CD last week and reports he had intermittent palpitations then but denies any recurrent symptoms since. No recent orthopnea, PND, lower extremity edema, or chest pain.  He is still very anxious about undergoing an ablation as previously discussed by Dr. Ladona Ridgel. He is concerned about the invasive nature of the procedure along with the financial cost of this as he is currently without insurance coverage.  Past Medical History:  Diagnosis Date  . Hypertension   . Hypothyroidism   . Meningitis   . Pneumonia   . PSVT (paroxysmal supraventricular tachycardia) (HCC)   . PVD (peripheral vascular disease) (HCC)   . Stroke (HCC)   . Tobacco use     Past Surgical History:  Procedure Laterality Date  . NO PAST SURGERIES      Current Medications: Outpatient Medications Prior to Visit  Medication Sig Dispense Refill  . aspirin 81 MG EC tablet Take 1 tablet (81 mg total) by mouth daily. 100 tablet 0  . atorvastatin (LIPITOR) 20 MG tablet Take 1 tablet (20 mg total) by mouth daily. 30 tablet 3  . levothyroxine (SYNTHROID, LEVOTHROID) 100 MCG tablet Take 1 tablet (100 mcg total) by mouth daily. 30 tablet 4  . losartan (COZAAR) 50 MG tablet Take 1 tablet (50 mg total) by mouth daily. 30 tablet 3  . metoprolol tartrate (LOPRESSOR) 50 MG tablet Take 0.5 tab (25 mg) by mouth twice  daily    . diltiazem (CARDIZEM CD) 180 MG 24 hr capsule Take 1 capsule (180 mg total) by mouth daily. 30 capsule 1  . flecainide (TAMBOCOR) 50 MG tablet Take 1 tablet (50 mg total) by mouth every 12 (twelve) hours. 60 tablet 0   No facility-administered medications prior to visit.      Allergies:   Amlodipine and Penicillins   Social History   Socioeconomic History  . Marital status: Divorced    Spouse name: Not on file  . Number of children: Not on file  . Years of education:  Not on file  . Highest education level: Not on file  Occupational History  . Not on file  Social Needs  . Financial resource strain: Not on file  . Food insecurity:    Worry: Not on file    Inability: Not on file  . Transportation needs:    Medical: Not on file    Non-medical: Not on file  Tobacco Use  . Smoking status: Former Smoker    Packs/day: 0.25    Years: 39.00    Pack years: 9.75    Types: Cigarettes    Last attempt to quit: 10/2018    Years since quitting: 0.1  . Smokeless tobacco: Never Used  Substance and Sexual Activity  . Alcohol use: No    Comment: previous heavy drinker. quit 2001  . Drug use: No    Types: Marijuana  . Sexual activity: Not on file  Lifestyle  . Physical activity:    Days per week: Not on file    Minutes per session: Not on file  . Stress: Not on file  Relationships  . Social connections:    Talks on phone: Not on file    Gets together: Not on file    Attends religious service: Not on file    Active member of club or organization: Not on file    Attends meetings of clubs or organizations: Not on file    Relationship status: Not on file  Other Topics Concern  . Not on file  Social History Narrative  . Not on file     Family History:  The patient's family history includes Alzheimer's disease in his mother; Arrhythmia in his sister; Prostate cancer in his father.   Review of Systems:   Please see the history of present illness.     General:  No chills, fever, night sweats or weight changes.  Cardiovascular:  No chest pain, dyspnea on exertion, edema, orthopnea, paroxysmal nocturnal dyspnea. Positive for palpitations.  Dermatological: No rash, lesions/masses Respiratory: No cough, dyspnea Urologic: No hematuria, dysuria Abdominal:   No nausea, vomiting, diarrhea, bright red blood per rectum, melena, or hematemesis Neurologic:  No visual changes or changes in mental status. Positive for right-sided weakness.  All other systems  reviewed and are otherwise negative except as noted above.   Physical Exam:    VS:  BP 124/84   Pulse 67   Ht 5\' 8"  (1.727 m)   Wt 228 lb (103.4 kg)   SpO2 98%   BMI 34.67 kg/m    General: Well developed, well nourished Caucasian male appearing in no acute distress. Head: Normocephalic, atraumatic, sclera non-icteric, no xanthomas, nares are without discharge.  Neck: No carotid bruits. JVD not elevated.  Lungs: Respirations regular and unlabored, without wheezes or rales.  Heart: Regular rate and rhythm. No S3 or S4.  No murmur, no rubs, or gallops appreciated. Abdomen: Soft, non-tender, non-distended with normoactive bowel  sounds. No hepatomegaly. No rebound/guarding. No obvious abdominal masses. Msk: No joint deformities or effusions. Weakness along right upper and lower extremities. Knee brace in place along right side.  Extremities: No clubbing or cyanosis. Trace lower extremity edema bilaterally.  Distal pedal pulses are 2+ bilaterally. Neuro: Alert and oriented X 3. Moves all extremities spontaneously. No focal deficits noted. Psych:  Responds to questions appropriately with a normal affect. Skin: No rashes or lesions noted  Wt Readings from Last 3 Encounters:  12/20/18 228 lb (103.4 kg)  11/13/18 200 lb (90.7 kg)  11/12/18 219 lb (99.3 kg)     Studies/Labs Reviewed:   EKG:  EKG is not ordered today.   Recent Labs: 03/31/2018: Magnesium 1.9 10/30/2018: Hemoglobin 14.8; Platelets 176 11/13/2018: ALT 36; BUN 11; Creatinine, Ser 1.24; Potassium 3.9; Sodium 137; TSH 6.641   Lipid Panel    Component Value Date/Time   CHOL 116 11/13/2018 1501   TRIG 84 11/13/2018 1501   HDL 35 (L) 11/13/2018 1501   CHOLHDL 3.3 11/13/2018 1501   VLDL 17 11/13/2018 1501   LDLCALC 64 11/13/2018 1501    Additional studies/ records that were reviewed today include:   Echocardiogram: 10/2018 Study Conclusions  - Left ventricle: The cavity size was normal. Systolic function was    normal. The estimated ejection fraction was in the range of 60%   to 65%. Wall motion was normal; there were no regional wall   motion abnormalities. Left ventricular diastolic function   parameters were normal. - Aortic valve: There was no significant regurgitation. - Mitral valve: There was trivial regurgitation. - Right ventricle: Systolic function was low normal. - Atrial septum: No defect or patent foramen ovale was identified. - Tricuspid valve: There was trivial regurgitation.  Impressions:  - No cardiac source of emboli was indentified.  Assessment:    1. Wide QRS ventricular tachycardia (HCC)   2. PSVT (paroxysmal supraventricular tachycardia) (HCC)   3. Essential hypertension   4. History of CVA (cerebrovascular accident)   5. Tobacco use      Plan:   In order of problems listed above:  1. Wide-Complex Tachycardia/ pSVT - patient was admitted in 03/2018 for intermittent episodes of WCT but also noted to have intermittent pSVT. Since being started on Flecainide 50mg  BID, he reports significant improvement in his palpitations. Also remains on Cardizem CD and Lopressor. Catheter ablation was previously offered by Dr. Ladona Ridgel but the patient declined. He still remains hesitant about the procedure given the invasive nature of it along with the financial implications as he is currently without insurance coverage but believes this will be in place within the next 2-3 months and wishes to discuss again at that time. Will continue on medical therapy for now and arrange for follow-up with Dr. Ladona Ridgel within the next few months to further discuss ablation.   2. HTN - BP is well-controlled at 124/84 during today's visit. - continue Cardizem CD 180mg  daily, Losartan 50mg  daily, and Lopressor 25mg  BID.   3. Recent CVA - recent MRI showed acute nonhemorrhagic left internal capsule/basal ganglia infarcts and moderate parenchymal brain volume loss. Was evaluated by Neurology who  recommended DAPT with ASA and Plavix for 3 weeks followed by ASA alone. - has residual right-sided weakness. Continue ASA and statin therapy. Was noted to have 1-39% RICA and 40-59% LICA stenoses during CVA work-up which will need to be followed.   4. Tobacco Use - he was previously smoking 2 ppd. Now down to 5-10 cigarettes per  day. Congratulated on his reduction with full cessation advised.   Medication Adjustments/Labs and Tests Ordered: Current medicines are reviewed at length with the patient today.  Concerns regarding medicines are outlined above.  Medication changes, Labs and Tests ordered today are listed in the Patient Instructions below. Patient Instructions  Medication Instructions:  Your physician recommends that you continue on your current medications as directed. Please refer to the Current Medication list given to you today.  If you need a refill on your cardiac medications before your next appointment, please call your pharmacy.   Lab work: NONE  If you have labs (blood work) drawn today and your tests are completely normal, you will receive your results only by: Marland Kitchen. MyChart Message (if you have MyChart) OR . A paper copy in the mail If you have any lab test that is abnormal or we need to change your treatment, we will call you to review the results.  Testing/Procedures: NONE   Follow-Up: At [facility], you and your health needs are our priority.  As part of our continuing mission to provide you with exceptional heart care, we have created designated Provider Care Teams.  These Care Teams include your primary Cardiologist (physician) and Advanced Practice Providers (APPs -  Physician Assistants and Nurse Practitioners) who all work together to provide you with the care you need, when you need it. You will need a follow up appointment in 2-3  months.  Please call our office 2 months in advance to schedule this appointment.  You may see Lewayne BuntingGregg Taylor, MD or one of the  following Advanced Practice Providers on your designated Care Team:   Randall AnBrittany Tana Trefry, PA-C [facility]([place] Office) . Jacolyn ReedyMichele Lenze, PA-C [facility]([place] Office)  Any Other Special Instructions Will Be Listed Below (If Applicable). Thank you for choosing [facility]!     Signed, Ellsworth LennoxBrittany M Dalila Arca, PA-C  12/21/2018 9:06 AM     [facility] [address] [place], [place] [address] Phone: [phone]

## 2018-12-21 ENCOUNTER — Encounter: Payer: Self-pay | Admitting: Student

## 2018-12-24 ENCOUNTER — Encounter: Payer: Medicaid Other | Admitting: Physical Medicine & Rehabilitation

## 2018-12-30 ENCOUNTER — Ambulatory Visit: Payer: Self-pay | Admitting: Physician Assistant

## 2018-12-30 ENCOUNTER — Encounter: Payer: Self-pay | Admitting: Physician Assistant

## 2018-12-30 VITALS — BP 140/80 | HR 69 | Temp 98.1°F | Ht 68.0 in | Wt 235.0 lb

## 2018-12-30 DIAGNOSIS — R609 Edema, unspecified: Secondary | ICD-10-CM

## 2018-12-30 DIAGNOSIS — R0601 Orthopnea: Secondary | ICD-10-CM

## 2018-12-30 DIAGNOSIS — F17200 Nicotine dependence, unspecified, uncomplicated: Secondary | ICD-10-CM

## 2018-12-30 DIAGNOSIS — I10 Essential (primary) hypertension: Secondary | ICD-10-CM

## 2018-12-30 DIAGNOSIS — Z550 Illiteracy and low-level literacy: Secondary | ICD-10-CM

## 2018-12-30 NOTE — Patient Instructions (Signed)
Have you filled out and turned in application for [facility]?  Artist- [phone]

## 2018-12-30 NOTE — Progress Notes (Signed)
BP 140/80 (BP Location: Left Arm, Patient Position: Sitting, Cuff Size: Normal)   Pulse 69   Temp 98.1 F (36.7 C)   Ht 5\' 8"  (1.727 m)   Wt 235 lb (106.6 kg)   SpO2 97%   BMI 35.73 kg/m    Subjective:    Patient ID: David Mosley, male    DOB: 08/17/1956, 63 y.o.   MRN: [id number]  HPI: David Mosley is a 63 y.o. male presenting on 12/30/2018 for Follow-up   HPI   Pt expecting to have medicaid within the next few months  He is having hard time getting adjust to having disabilites/trouble walking.  He is still working on the exercises.  He says he has a goal to be walking with a cane by the summer.     He c/o swelling both feet.  He elevates his feet at home.  It is worse than when he was discharged from hospital.   He is having orthopnea.  He no longer lays down to sleep at night.  Pt had echo in November 2019 with normal EF.    Relevant past medical, surgical, family and social history reviewed and updated as indicated. Interim medical history since our last visit reviewed. Allergies and medications reviewed and updated.   Current Outpatient Medications:  .  aspirin 81 MG EC tablet, Take 1 tablet (81 mg total) by mouth daily., Disp: 100 tablet, Rfl: 0 .  atorvastatin (LIPITOR) 20 MG tablet, Take 1 tablet (20 mg total) by mouth daily., Disp: 30 tablet, Rfl: 3 .  levothyroxine (SYNTHROID, LEVOTHROID) 100 MCG tablet, Take 1 tablet (100 mcg total) by mouth daily., Disp: 30 tablet, Rfl: 4 .  diltiazem (CARDIZEM CD) 180 MG 24 hr capsule, Take 1 capsule (180 mg total) by mouth daily., Disp: 30 capsule, Rfl: 11 .  flecainide (TAMBOCOR) 50 MG tablet, Take 1 tablet (50 mg total) by mouth every 12 (twelve) hours., Disp: 180 tablet, Rfl: 3 .  losartan (COZAAR) 50 MG tablet, Take 1 tablet (50 mg total) by mouth daily., Disp: 30 tablet, Rfl: 3 .  metoprolol tartrate (LOPRESSOR) 50 MG tablet, Take 0.5 tab (25 mg) by mouth twice daily, Disp: , Rfl:    Review of Systems  Constitutional:  Positive for appetite change and fatigue. Negative for chills, diaphoresis, fever and unexpected weight change.  HENT: Positive for congestion, hearing loss and sneezing. Negative for dental problem, drooling, ear pain, facial swelling, mouth sores, sore throat, trouble swallowing and voice change.   Eyes: Positive for visual disturbance. Negative for pain, discharge, redness and itching.  Respiratory: Positive for wheezing. Negative for cough, choking and shortness of breath.   Cardiovascular: Positive for leg swelling. Negative for chest pain and palpitations.  Gastrointestinal: Positive for constipation. Negative for abdominal pain, blood in stool, diarrhea and vomiting.  Endocrine: Negative for cold intolerance, heat intolerance and polydipsia.  Genitourinary: Negative for decreased urine volume, dysuria and hematuria.  Musculoskeletal: Positive for arthralgias, back pain and gait problem.  Skin: Negative for rash.  Allergic/Immunologic: Positive for environmental allergies.  Neurological: Positive for light-headedness and headaches. Negative for seizures and syncope.  Hematological: Negative for adenopathy.  Psychiatric/Behavioral: Negative for agitation, dysphoric mood and suicidal ideas. The patient is not nervous/anxious.     Per HPI unless specifically indicated above     Objective:    BP 140/80 (BP Location: Left Arm, Patient Position: Sitting, Cuff Size: Normal)   Pulse 69   Temp 98.1 F (36.7 C)   Ht  5\' 8"  (1.727 m)   Wt 235 lb (106.6 kg)   SpO2 97%   BMI 35.73 kg/m   Wt Readings from Last 3 Encounters:  12/30/18 235 lb (106.6 kg)  12/20/18 228 lb (103.4 kg)  11/13/18 200 lb (90.7 kg)    Physical Exam Vitals signs reviewed.  Constitutional:      Appearance: He is well-developed.  HENT:     Head: Normocephalic and atraumatic.  Neck:     Musculoskeletal: Neck supple.  Cardiovascular:     Rate and Rhythm: Normal rate and regular rhythm.     Pulses:           Dorsalis pedis pulses are 2+ on the right side and 2+ on the left side.  Pulmonary:     Effort: Pulmonary effort is normal. No respiratory distress.     Breath sounds: Rales (few soft R base) present. No wheezing.  Abdominal:     General: Bowel sounds are normal.     Palpations: Abdomen is soft.     Tenderness: There is no abdominal tenderness.  Musculoskeletal:     Right lower leg: Edema present.     Left lower leg: Edema present.  Lymphadenopathy:     Cervical: No cervical adenopathy.  Skin:    General: Skin is warm and dry.  Neurological:     Mental Status: He is alert and oriented to person, place, and time.  Psychiatric:        Behavior: Behavior normal.         Results for orders placed or performed during the hospital encounter of 11/13/18  Lipid panel  Result Value Ref Range   Cholesterol 116 0 - 200 mg/dL   Triglycerides 84 <270 mg/dL   HDL 35 (L) >35 mg/dL   Total CHOL/HDL Ratio 3.3 RATIO   VLDL 17 0 - 40 mg/dL   LDL Cholesterol 64 0 - 99 mg/dL  Comprehensive metabolic panel  Result Value Ref Range   Sodium 137 135 - 145 mmol/L   Potassium 3.9 3.5 - 5.1 mmol/L   Chloride 104 98 - 111 mmol/L   CO2 27 22 - 32 mmol/L   Glucose, Bld 94 70 - 99 mg/dL   BUN 11 8 - 23 mg/dL   Creatinine, Ser 0.09 0.61 - 1.24 mg/dL   Calcium 9.3 8.9 - 38.1 mg/dL   Total Protein 7.6 6.5 - 8.1 g/dL   Albumin 4.0 3.5 - 5.0 g/dL   AST 24 15 - 41 U/L   ALT 36 0 - 44 U/L   Alkaline Phosphatase 40 38 - 126 U/L   Total Bilirubin 0.7 0.3 - 1.2 mg/dL   GFR calc non Af Amer >60 >60 mL/min   GFR calc Af Amer >60 >60 mL/min   Anion gap 6 5 - 15  TSH  Result Value Ref Range   TSH 6.641 (H) 0.350 - 4.500 uIU/mL      Assessment & Plan:    Encounter Diagnoses  Name Primary?  . Essential hypertension Yes  . Edema, unspecified type   . Unable to read or write   . Tobacco use disorder   . Orthopnea     -Update CXR -Check bmp, bnp -Discussed fluid pills.  Pt declined at this  time.  -counseled on the significant need to stop smoking -pt to continue exercises at home -no medication changes today.  Pt reminded to bring all meds to every appointment -pt to check on [facility] -f/u with dr  Hermelinda MedicusSchwartz as scheduled -pt to follow up 1 month.  RTO sooner prn worsening or if he decides to start fluid pills

## 2018-12-31 ENCOUNTER — Ambulatory Visit (HOSPITAL_COMMUNITY)
Admission: RE | Admit: 2018-12-31 | Discharge: 2018-12-31 | Disposition: A | Discharge status: Home / Self Care | Payer: Medicaid Other | Source: Ambulatory Visit | Attending: Physician Assistant | Admitting: Physician Assistant

## 2018-12-31 ENCOUNTER — Other Ambulatory Visit (HOSPITAL_COMMUNITY)
Admission: RE | Admit: 2018-12-31 | Discharge: 2018-12-31 | Disposition: A | Discharge status: Home / Self Care | Payer: Medicaid Other | Source: Ambulatory Visit | Attending: Physician Assistant | Admitting: Physician Assistant

## 2018-12-31 DIAGNOSIS — I10 Essential (primary) hypertension: Secondary | ICD-10-CM

## 2018-12-31 DIAGNOSIS — R609 Edema, unspecified: Secondary | ICD-10-CM | POA: Insufficient documentation

## 2018-12-31 LAB — BASIC METABOLIC PANEL
Anion gap: 7 (ref 5–15)
BUN: 12 mg/dL (ref 8–23)
CO2: 24 mmol/L (ref 22–32)
Calcium: 8.9 mg/dL (ref 8.9–10.3)
Chloride: 106 mmol/L (ref 98–111)
Creatinine, Ser: 1.25 mg/dL — ABNORMAL HIGH (ref 0.61–1.24)
GFR calc non Af Amer: >60 mL/min (ref >60)
Glucose, Bld: 96 mg/dL (ref 70–99)
Potassium: 3.8 mmol/L (ref 3.5–5.1)
Sodium: 137 mmol/L (ref 135–145)

## 2018-12-31 LAB — BRAIN NATRIURETIC PEPTIDE: B Natriuretic Peptide: 57 pg/mL (ref 0.0–100.0)

## 2019-01-15 ENCOUNTER — Encounter: Payer: Medicaid Other | Attending: Registered Nurse | Admitting: Physical Medicine & Rehabilitation

## 2019-01-15 DIAGNOSIS — F17210 Nicotine dependence, cigarettes, uncomplicated: Secondary | ICD-10-CM | POA: Insufficient documentation

## 2019-01-15 DIAGNOSIS — I6350 Cerebral infarction due to unspecified occlusion or stenosis of unspecified cerebral artery: Secondary | ICD-10-CM | POA: Insufficient documentation

## 2019-01-15 DIAGNOSIS — I471 Supraventricular tachycardia: Secondary | ICD-10-CM | POA: Insufficient documentation

## 2019-01-15 DIAGNOSIS — H538 Other visual disturbances: Secondary | ICD-10-CM | POA: Insufficient documentation

## 2019-01-15 DIAGNOSIS — I10 Essential (primary) hypertension: Secondary | ICD-10-CM | POA: Insufficient documentation

## 2019-01-27 ENCOUNTER — Encounter: Payer: Self-pay | Admitting: Physician Assistant

## 2019-01-27 ENCOUNTER — Ambulatory Visit: Payer: Self-pay | Admitting: Physician Assistant

## 2019-01-27 VITALS — BP 157/89 | HR 68 | Temp 97.9°F | Ht 68.0 in | Wt 235.5 lb

## 2019-01-27 DIAGNOSIS — Z8673 Personal history of transient ischemic attack (TIA), and cerebral infarction without residual deficits: Secondary | ICD-10-CM

## 2019-01-27 DIAGNOSIS — Z550 Illiteracy and low-level literacy: Secondary | ICD-10-CM

## 2019-01-27 DIAGNOSIS — E785 Hyperlipidemia, unspecified: Secondary | ICD-10-CM

## 2019-01-27 DIAGNOSIS — N189 Chronic kidney disease, unspecified: Secondary | ICD-10-CM

## 2019-01-27 DIAGNOSIS — E039 Hypothyroidism, unspecified: Secondary | ICD-10-CM

## 2019-01-27 DIAGNOSIS — R609 Edema, unspecified: Secondary | ICD-10-CM

## 2019-01-27 DIAGNOSIS — I10 Essential (primary) hypertension: Secondary | ICD-10-CM

## 2019-01-27 DIAGNOSIS — J439 Emphysema, unspecified: Secondary | ICD-10-CM

## 2019-01-27 DIAGNOSIS — F17200 Nicotine dependence, unspecified, uncomplicated: Secondary | ICD-10-CM

## 2019-01-27 MED ORDER — FUROSEMIDE 20 MG PO TABS: 20.0000 mg | ORAL_TABLET | Freq: Every day | ORAL | 1 refills | Status: DC

## 2019-01-27 MED ORDER — LOSARTAN POTASSIUM 50 MG PO TABS: 50.0000 mg | ORAL_TABLET | Freq: Every day | ORAL | 1 refills | Status: DC

## 2019-01-27 NOTE — Progress Notes (Signed)
BP (!) 157/89 (BP Location: Right Arm, Patient Position: Sitting, Cuff Size: Normal)   Pulse 68   Temp 97.9 F (36.6 C)   Ht 5\' 8"  (1.727 m)   Wt 235 lb 8 oz (106.8 kg)   SpO2 96%   BMI 35.81 kg/m    Subjective:    Patient ID: David Mosley, male    DOB: 01-May-1956, 63 y.o.   MRN: [id number]  HPI: David Mosley is a 63 y.o. male presenting on 01/27/2019 for Edema; Shortness of Breath; and Hypertension   HPI Pt thinks his bp is up today because he ate 2 SPAM sandwiches this morning.  He is in process of applying to medicaid.  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  Pt has been given applications for [facility] which he doesn't have yet either.  Pt says he was supposed to follow up with vein specialist about his neck but he hasn't due to financial issues.    Pt says he is about the same as last time.  No new problems today.  He is still ambulating with his rolling walker.   Relevant past medical, surgical, family and social history reviewed and updated as indicated. Interim medical history since our last visit reviewed. Allergies and medications reviewed and updated.   Current Outpatient Medications:  .  aspirin 81 MG EC tablet, Take 1 tablet (81 mg total) by mouth daily., Disp: 100 tablet, Rfl: 0 .  atorvastatin (LIPITOR) 20 MG tablet, Take 1 tablet (20 mg total) by mouth daily., Disp: 30 tablet, Rfl: 3 .  diltiazem (CARDIZEM CD) 180 MG 24 hr capsule, Take 1 capsule (180 mg total) by mouth daily., Disp: 30 capsule, Rfl: 11 .  flecainide (TAMBOCOR) 50 MG tablet, Take 1 tablet (50 mg total) by mouth every 12 (twelve) hours., Disp: 180 tablet, Rfl: 3 .  levothyroxine (SYNTHROID, LEVOTHROID) 100 MCG tablet, Take 1 tablet (100 mcg total) by mouth daily., Disp: 30 tablet, Rfl: 4 .  metoprolol tartrate (LOPRESSOR) 50 MG tablet, Take 50 mg by mouth 2 (two) times daily. , Disp: , Rfl:  .  losartan (COZAAR) 50 MG  tablet, Take 1 tablet (50 mg total) by mouth daily. (Patient not taking: Reported on 01/27/2019), Disp: 30 tablet, Rfl: 3    Review of Systems  Respiratory: Negative for shortness of breath.   Cardiovascular: Negative for chest pain.  Gastrointestinal: Negative for abdominal pain.    Per HPI unless specifically indicated above     Objective:    BP (!) 157/89 (BP Location: Right Arm, Patient Position: Sitting, Cuff Size: Normal)   Pulse 68   Temp 97.9 F (36.6 C)   Ht 5\' 8"  (1.727 m)   Wt 235 lb 8 oz (106.8 kg)   SpO2 96%   BMI 35.81 kg/m   Wt Readings from Last 3 Encounters:  01/27/19 235 lb 8 oz (106.8 kg)  12/30/18 235 lb (106.6 kg)  12/20/18 228 lb (103.4 kg)    Physical Exam Vitals signs reviewed.  Constitutional:      Appearance: He is well-developed.  HENT:     Head: Normocephalic and atraumatic.  Neck:     Musculoskeletal: Neck supple.  Cardiovascular:     Rate and Rhythm: Normal rate and regular rhythm.  Pulmonary:     Effort: Pulmonary effort is normal.     Breath sounds: Normal breath sounds. No wheezing.  Abdominal:  General: Bowel sounds are normal.     Palpations: Abdomen is soft.     Tenderness: There is no abdominal tenderness.  Musculoskeletal:     Right lower leg: Edema present.     Left lower leg: Edema present.  Lymphadenopathy:     Cervical: No cervical adenopathy.  Skin:    General: Skin is warm and dry.  Neurological:     Mental Status: He is alert and oriented to person, place, and time.  Psychiatric:        Behavior: Behavior normal.     Results for orders placed or performed during the hospital encounter of 12/31/18  Basic metabolic panel  Result Value Ref Range   Sodium 137 135 - 145 mmol/L   Potassium 3.8 3.5 - 5.1 mmol/L   Chloride 106 98 - 111 mmol/L   CO2 24 22 - 32 mmol/L   Glucose, Bld 96 70 - 99 mg/dL   BUN 12 8 - 23 mg/dL   Creatinine, Ser 2.87 (H) 0.61 - 1.24 mg/dL   Calcium 8.9 8.9 - 68.1 mg/dL   GFR calc non  Af Amer >60 >60 mL/min   GFR calc Af Amer >60 >60 mL/min   Anion gap 7 5 - 15  B Nat Peptide  Result Value Ref Range   B Natriuretic Peptide 57.0 0.0 - 100.0 pg/mL      Assessment & Plan:    Encounter Diagnoses  Name Primary?  . Essential hypertension Yes  . Edema, unspecified type   . Unable to read or write   . Hyperlipidemia, unspecified hyperlipidemia type   . Hypothyroidism, unspecified type   . Tobacco use disorder   . Chronic kidney disease, unspecified CKD stage   . History of stroke   . Pulmonary emphysema, unspecified emphysema type (HCC)     -reviewed labs and CXR with pt -pt counseled to Get back on losartan  -Pt to continue PT exercises at home -counseled pt on smoking cessation -recommended pt Contact financial counselor to get help with his financial assistance -will start lasix for edema.  He is counseled to elevate the legs when seated and limit salt intake -pt counseled to get labs drawn 2 weeks after starting lasix.  Discussed with pt will need to monitor kidney function while on the lasix -pt to follow up here 1 month.  RTO sooner prn

## 2019-01-27 NOTE — Patient Instructions (Addendum)
Financial Counselor [phone]   ---------------------------------  2 weeks after starting lasix (furosemide), get labs/bloodwork done (fasting)

## 2019-02-17 ENCOUNTER — Other Ambulatory Visit (HOSPITAL_COMMUNITY)
Admission: RE | Admit: 2019-02-17 | Discharge: 2019-02-17 | Disposition: A | Discharge status: Home / Self Care | Payer: Medicaid Other | Source: Ambulatory Visit | Attending: Physician Assistant | Admitting: Physician Assistant

## 2019-02-17 DIAGNOSIS — E785 Hyperlipidemia, unspecified: Secondary | ICD-10-CM

## 2019-02-17 DIAGNOSIS — E039 Hypothyroidism, unspecified: Secondary | ICD-10-CM

## 2019-02-17 DIAGNOSIS — I10 Essential (primary) hypertension: Secondary | ICD-10-CM

## 2019-02-17 DIAGNOSIS — N189 Chronic kidney disease, unspecified: Secondary | ICD-10-CM | POA: Diagnosis present

## 2019-02-17 DIAGNOSIS — R609 Edema, unspecified: Secondary | ICD-10-CM | POA: Diagnosis present

## 2019-02-17 DIAGNOSIS — R69 Illness, unspecified: Secondary | ICD-10-CM | POA: Diagnosis present

## 2019-02-17 LAB — COMPREHENSIVE METABOLIC PANEL
ALBUMIN: 4 g/dL (ref 3.5–5.0)
ALT: 35 U/L (ref 0–44)
ANION GAP: 9 (ref 5–15)
AST: 28 U/L (ref 15–41)
Alkaline Phosphatase: 41 U/L (ref 38–126)
BUN: 13 mg/dL (ref 8–23)
CHLORIDE: 103 mmol/L (ref 98–111)
CO2: 23 mmol/L (ref 22–32)
Calcium: 9.1 mg/dL (ref 8.9–10.3)
Creatinine, Ser: 1.27 mg/dL — ABNORMAL HIGH (ref 0.61–1.24)
GFR calc Af Amer: >60 mL/min (ref >60)
GFR calc non Af Amer: >60 mL/min (ref >60)
Glucose, Bld: 102 mg/dL — ABNORMAL HIGH (ref 70–99)
Potassium: 3.9 mmol/L (ref 3.5–5.1)
Sodium: 135 mmol/L (ref 135–145)
Total Bilirubin: 0.6 mg/dL (ref 0.3–1.2)
Total Protein: 7.6 g/dL (ref 6.5–8.1)

## 2019-02-17 LAB — LIPID PANEL
Cholesterol: 120 mg/dL (ref 0–200)
HDL: 35 mg/dL — ABNORMAL LOW (ref >40)
LDL Cholesterol: 65 mg/dL (ref 0–99)
Total CHOL/HDL Ratio: 3.4 RATIO
Triglycerides: 100 mg/dL (ref <150)
VLDL: 20 mg/dL (ref 0–40)

## 2019-02-17 LAB — TSH: TSH: 5.978 u[IU]/mL — ABNORMAL HIGH (ref 0.350–4.500)

## 2019-02-24 ENCOUNTER — Ambulatory Visit: Payer: Self-pay | Admitting: Physician Assistant

## 2019-02-25 ENCOUNTER — Ambulatory Visit: Payer: Self-pay | Admitting: Physician Assistant

## 2019-02-25 ENCOUNTER — Encounter: Payer: Self-pay | Admitting: Physician Assistant

## 2019-02-25 VITALS — BP 160/72 | HR 71 | Temp 98.2°F | Ht 68.0 in

## 2019-02-25 DIAGNOSIS — Z550 Illiteracy and low-level literacy: Secondary | ICD-10-CM

## 2019-02-25 DIAGNOSIS — Z8673 Personal history of transient ischemic attack (TIA), and cerebral infarction without residual deficits: Secondary | ICD-10-CM

## 2019-02-25 DIAGNOSIS — E785 Hyperlipidemia, unspecified: Secondary | ICD-10-CM

## 2019-02-25 DIAGNOSIS — E039 Hypothyroidism, unspecified: Secondary | ICD-10-CM

## 2019-02-25 DIAGNOSIS — R000 Tachycardia, unspecified: Secondary | ICD-10-CM

## 2019-02-25 DIAGNOSIS — J439 Emphysema, unspecified: Secondary | ICD-10-CM

## 2019-02-25 DIAGNOSIS — I10 Essential (primary) hypertension: Secondary | ICD-10-CM

## 2019-02-25 DIAGNOSIS — F17200 Nicotine dependence, unspecified, uncomplicated: Secondary | ICD-10-CM

## 2019-02-25 DIAGNOSIS — R609 Edema, unspecified: Secondary | ICD-10-CM

## 2019-02-25 DIAGNOSIS — N189 Chronic kidney disease, unspecified: Secondary | ICD-10-CM

## 2019-02-25 DIAGNOSIS — I472 Ventricular tachycardia: Secondary | ICD-10-CM

## 2019-02-25 MED ORDER — ALBUTEROL SULFATE HFA 108 (90 BASE) MCG/ACT IN AERS: 2.0000 | INHALATION_SPRAY | Freq: Four times a day (QID) | RESPIRATORY_TRACT | 1 refills | Status: AC | PRN

## 2019-02-25 MED ORDER — LOSARTAN POTASSIUM 100 MG PO TABS: 100.0000 mg | ORAL_TABLET | Freq: Every day | ORAL | 1 refills | Status: DC

## 2019-02-25 NOTE — Progress Notes (Signed)
BP (!) 160/72 (BP Location: Left Arm, Patient Position: Sitting, Cuff Size: Normal)   Pulse 71   Temp 98.2 F (36.8 C)   Ht 5\' 8"  (1.727 m)   SpO2 96%   BMI 35.81 kg/m    Subjective:    Patient ID: David Mosley, male    DOB: March 27, 1956, 63 y.o.   MRN: [id number]  HPI: David Mosley is a 63 y.o. male presenting on 02/25/2019 for Follow-up (pt now has Medicaid. pt states he got his card last Thursday.)   HPI  Chief Complaint  Patient presents with  . Follow-up    pt now has Medicaid. pt states he got his card last Thursday.    Pt has not yet started looking for new PCP.    He is reminded that he will need to get one as [facility] does not accept insurance.    Pt says he is doing okay although he is disappointed that he will have to start with new medical provider now that he has gotten used to coming here.     In April 2019 this provider sent pt to ER after diagnosis of SVT.   Pt was seen by cardiology and felt to have wide QRS tachycardia.  He has been recommended to get ablation by cardiology but has not yet due to fears that he might end up needing a pacemaker.    More recently, in November 2019 pt had a stroke and continues with R sided weakness.    Pt continues with difficult to control HTN and he continues to smoke despite much counseling on cessation.    Pt care has been complicated by his non-reading status.  His pill bottles have been labeled with twice daily meds getting a sticker on the lid and once daily meds having no sticker.  He likes this and says it works well for him.  Pt gets help from his sister and his ex-wife.    Pt says he is doing okay today other than being disappointed as stated above.     Relevant past medical, surgical, family and social history reviewed and updated as indicated. Interim medical history since our last visit reviewed. Allergies and medications reviewed and updated.   Current Outpatient Medications:  .  aspirin 81 MG EC tablet, Take 1  tablet (81 mg total) by mouth daily., Disp: 100 tablet, Rfl: 0 .  atorvastatin (LIPITOR) 20 MG tablet, Take 1 tablet (20 mg total) by mouth daily., Disp: 30 tablet, Rfl: 3 .  diltiazem (CARDIZEM CD) 180 MG 24 hr capsule, Take 1 capsule (180 mg total) by mouth daily., Disp: 30 capsule, Rfl: 11 .  flecainide (TAMBOCOR) 50 MG tablet, Take 1 tablet (50 mg total) by mouth every 12 (twelve) hours., Disp: 180 tablet, Rfl: 3 .  furosemide (LASIX) 20 MG tablet, Take 1 tablet (20 mg total) by mouth daily., Disp: 90 tablet, Rfl: 1 .  levothyroxine (SYNTHROID, LEVOTHROID) 100 MCG tablet, Take 1 tablet (100 mcg total) by mouth daily., Disp: 30 tablet, Rfl: 4 .  losartan (COZAAR) 50 MG tablet, Take 1 tablet (50 mg total) by mouth daily., Disp: 90 tablet, Rfl: 1 .  metoprolol tartrate (LOPRESSOR) 50 MG tablet, Take 50 mg by mouth 2 (two) times daily. , Disp: , Rfl:     Review of Systems  Constitutional: Negative for appetite change, chills, diaphoresis, fatigue, fever and unexpected weight change.  HENT: Negative for congestion, dental problem, drooling, ear pain, facial swelling, hearing loss, mouth sores, sneezing,  sore throat, trouble swallowing and voice change.   Eyes: Negative for pain, discharge, redness, itching and visual disturbance.  Respiratory: Positive for wheezing. Negative for cough, choking and shortness of breath.   Cardiovascular: Negative for chest pain, palpitations and leg swelling.  Gastrointestinal: Negative for abdominal pain, blood in stool, constipation, diarrhea and vomiting.  Endocrine: Negative for cold intolerance, heat intolerance and polydipsia.  Genitourinary: Negative for decreased urine volume, dysuria and hematuria.  Musculoskeletal: Negative for arthralgias, back pain and gait problem.  Skin: Negative for rash.  Allergic/Immunologic: Negative for environmental allergies.  Neurological: Negative for seizures, syncope, light-headedness and headaches.  Hematological:  Negative for adenopathy.  Psychiatric/Behavioral: Negative for agitation, dysphoric mood and suicidal ideas. The patient is not nervous/anxious.     Per HPI unless specifically indicated above     Objective:    BP (!) 160/72 (BP Location: Left Arm, Patient Position: Sitting, Cuff Size: Normal)   Pulse 71   Temp 98.2 F (36.8 C)   Ht 5\' 8"  (1.727 m)   SpO2 96%   BMI 35.81 kg/m   Wt Readings from Last 3 Encounters:  01/27/19 235 lb 8 oz (106.8 kg)  12/30/18 235 lb (106.6 kg)  12/20/18 228 lb (103.4 kg)    Physical Exam Vitals signs and nursing note reviewed.  HENT:     Head: Normocephalic and atraumatic.  Cardiovascular:     Rate and Rhythm: Normal rate and regular rhythm.  Pulmonary:     Effort: Pulmonary effort is normal. No respiratory distress.     Breath sounds: Normal breath sounds.  Musculoskeletal:     Right lower leg: Edema present.     Left lower leg: Edema present.  Skin:    General: Skin is warm and dry.  Neurological:     Mental Status: He is alert and oriented to person, place, and time.     Comments: Ambulating with rolling walker  Psychiatric:        Mood and Affect: Mood normal.     Results for orders placed or performed during the hospital encounter of 02/17/19  TSH  Result Value Ref Range   TSH 5.978 (H) 0.350 - 4.500 uIU/mL  Lipid panel  Result Value Ref Range   Cholesterol 120 0 - 200 mg/dL   Triglycerides 818 <299 mg/dL   HDL 35 (L) >37 mg/dL   Total CHOL/HDL Ratio 3.4 RATIO   VLDL 20 0 - 40 mg/dL   LDL Cholesterol 65 0 - 99 mg/dL  Comprehensive metabolic panel  Result Value Ref Range   Sodium 135 135 - 145 mmol/L   Potassium 3.9 3.5 - 5.1 mmol/L   Chloride 103 98 - 111 mmol/L   CO2 23 22 - 32 mmol/L   Glucose, Bld 102 (H) 70 - 99 mg/dL   BUN 13 8 - 23 mg/dL   Creatinine, Ser 1.69 (H) 0.61 - 1.24 mg/dL   Calcium 9.1 8.9 - 67.8 mg/dL   Total Protein 7.6 6.5 - 8.1 g/dL   Albumin 4.0 3.5 - 5.0 g/dL   AST 28 15 - 41 U/L   ALT 35 0 -  44 U/L   Alkaline Phosphatase 41 38 - 126 U/L   Total Bilirubin 0.6 0.3 - 1.2 mg/dL   GFR calc non Af Amer >60 >60 mL/min   GFR calc Af Amer >60 >60 mL/min   Anion gap 9 5 - 15      Assessment & Plan:   Encounter Diagnoses  Name Primary?  Marland Kitchen  Essential hypertension Yes  . Unable to read or write   . Edema, unspecified type   . Hyperlipidemia, unspecified hyperlipidemia type   . Hypothyroidism, unspecified type   . Chronic kidney disease, unspecified CKD stage   . Tobacco use disorder   . Pulmonary emphysema, unspecified emphysema type (HCC)   . History of stroke   . Wide-complex tachycardia (HCC)     -reviewed labs with pt -pt given rx for inhaler to use as needed.  Encouraged smoking cessaiton -will increase losartan due to bp elevated.  Pt to continue other medications.  Discussed process of transferring his meds from [facility] to local pharmacy -pt encouraged to start calling today to get established with new PCP asap

## 2019-03-04 ENCOUNTER — Other Ambulatory Visit: Payer: Self-pay | Admitting: Physician Assistant

## 2019-03-04 MED ORDER — FUROSEMIDE 20 MG PO TABS: 20.0000 mg | ORAL_TABLET | Freq: Every day | ORAL | 1 refills | Status: DC

## 2019-03-13 ENCOUNTER — Ambulatory Visit: Payer: Self-pay | Admitting: Internal Medicine

## 2019-05-02 ENCOUNTER — Other Ambulatory Visit: Payer: Self-pay | Admitting: Physician Assistant

## 2019-06-11 ENCOUNTER — Other Ambulatory Visit: Payer: Self-pay

## 2019-06-11 ENCOUNTER — Ambulatory Visit: Payer: Medicaid Other | Admitting: Internal Medicine

## 2019-06-11 ENCOUNTER — Encounter: Payer: Self-pay | Admitting: Internal Medicine

## 2019-06-11 VITALS — BP 155/86 | HR 60 | Ht 69.0 in | Wt 246.0 lb

## 2019-06-11 DIAGNOSIS — I472 Ventricular tachycardia, unspecified: Secondary | ICD-10-CM

## 2019-06-11 DIAGNOSIS — I10 Essential (primary) hypertension: Secondary | ICD-10-CM

## 2019-06-11 DIAGNOSIS — Z720 Tobacco use: Secondary | ICD-10-CM | POA: Diagnosis not present

## 2019-06-11 NOTE — Progress Notes (Signed)
HPI [name] returns today for followup of his atrial arrhythmias. I saw him over a year ago for wide QRS tachycardia in the setting of normal LV function. He was asked to reduce his caffeine intake and was started on flecainide. His palpitations have been quiet. He has gained over 30 lbs. He has not stopped smoking. His BP is stable.but has been elevated.  Allergies  Allergen Reactions  . Amlodipine Nausea Only, Anxiety and Palpitations  . Penicillins Itching    Has patient had a PCN reaction causing immediate rash, facial/tongue/throat swelling, SOB or lightheadedness with hypotension: No Has patient had a PCN reaction causing severe rash involving mucus membranes or skin necrosis: No Has patient had a PCN reaction that required hospitalization: No Has patient had a PCN reaction occurring within the last 10 years: No\ If all of the above answers are "NO", then may proceed with Cephalosporin use.     Current Outpatient Medications  Medication Sig Dispense Refill  . albuterol (PROVENTIL HFA;VENTOLIN HFA) 108 (90 Base) MCG/ACT inhaler Inhale 2 puffs into the lungs every 6 (six) hours as needed for wheezing or shortness of breath. 1 Inhaler 1  . aspirin 81 MG EC tablet Take 1 tablet (81 mg total) by mouth daily. 100 tablet 0  . atorvastatin (LIPITOR) 20 MG tablet Take 1 tablet (20 mg total) by mouth daily. 30 tablet 3  . Calcium Citrate-Vitamin D 315-250 MG-UNIT TABS Take by mouth.    . diltiazem (CARDIZEM CD) 180 MG 24 hr capsule Take 1 capsule (180 mg total) by mouth daily. 30 capsule 11  . flecainide (TAMBOCOR) 50 MG tablet Take 1 tablet (50 mg total) by mouth every 12 (twelve) hours. 180 tablet 3  . furosemide (LASIX) 20 MG tablet Take 1 tablet by mouth once daily 30 tablet 0  . levothyroxine (SYNTHROID, LEVOTHROID) 100 MCG tablet Take 1 tablet (100 mcg total) by mouth daily. 30 tablet 4  . losartan (COZAAR) 100 MG tablet Take 1 tablet by mouth once daily 30 tablet 0  .  metoprolol tartrate (LOPRESSOR) 50 MG tablet Take 50 mg by mouth 2 (two) times daily.      No current facility-administered medications for this visit.      Past Medical History:  Diagnosis Date  . Hypertension   . Hypothyroidism   . Meningitis   . Pneumonia   . PSVT (paroxysmal supraventricular tachycardia) ([place])   . PVD (peripheral vascular disease) ([place])   . Stroke ([place])   . Tobacco use     ROS:   All systems reviewed and negative except as noted in the HPI.   Past Surgical History:  Procedure Laterality Date  . NO PAST SURGERIES       Family History  Problem Relation Age of Onset  . Alzheimer's disease Mother   . Prostate cancer Father   . Arrhythmia Sister      Social History   Socioeconomic History  . Marital status: Divorced    Spouse name: Not on file  . Number of children: Not on file  . Years of education: Not on file  . Highest education level: Not on file  Occupational History  . Not on file  Social Needs  . Financial resource strain: Not on file  . Food insecurity    Worry: Not on file    Inability: Not on file  . Transportation needs    Medical: Not on file    Non-medical: Not on file  Tobacco Use  . Smoking status: Light Tobacco Smoker    Packs/day: 0.25    Years: 39.00    Pack years: 9.75    Types: Cigarettes  . Smokeless tobacco: Never Used  . Tobacco comment: 10 cigarrettes/week  Substance and Sexual Activity  . Alcohol use: No    Comment: previous heavy drinker. quit 2001  . Drug use: No    Types: Marijuana  . Sexual activity: Not on file  Lifestyle  . Physical activity    Days per week: Not on file    Minutes per session: Not on file  . Stress: Not on file  Relationships  . Social Musicianconnections    Talks on phone: Not on file    Gets together: Not on file    Attends religious service: Not on file    Active member of club or organization: Not on file    Attends meetings of clubs or organizations: Not on file     Relationship status: Not on file  . Intimate partner violence    Fear of current or ex partner: Not on file    Emotionally abused: Not on file    Physically abused: Not on file    Forced sexual activity: Not on file  Other Topics Concern  . Not on file  Social History Narrative  . Not on file     BP (!) 155/86 (BP Location: Right Arm)   Pulse 60   Ht 5\' 9"  (1.753 m)   Wt 246 lb (111.6 kg)   SpO2 91%   BMI 36.33 kg/m   Physical Exam:  Well appearing NAD HEENT: Unremarkable Neck:  6 cm JVD, no thyromegally Lymphatics:  No adenopathy Back:  No CVA tenderness Lungs:  No increased work of breathing HEART:  Regular rate rhythm, no murmurs, no rubs, no clicks Abd:  soft, positive bowel sounds, no organomegally, no rebound, no guarding Ext:  2 plus pulses, no edema, no cyanosis, no clubbing Skin:  No rashes no nodules Neuro:  CN II through XII intact, motor grossly intact  Assess/Plan: 1. Wide QRS tachycardia - is symptoms are controlled. He will continue low dose flecainide and a beta blocker. No indication for catheter ablation at this time. 2. Tobacco abuse - I encouraged the patient to reduce his tobacco use. He is down to less than a half pack a day. 3. HTN - his systolic pressure is not controlled.I encouraged the patient to lose weight and reduce his salt intake.

## 2019-06-11 NOTE — Patient Instructions (Signed)
Medication Instructions:  Your physician recommends that you continue on your current medications as directed. Please refer to the Current Medication list given to you today.  If you need a refill on your cardiac medications before your next appointment, please call your pharmacy.   Lab work: NONE   If you have labs (blood work) drawn today and your tests are completely normal, you will receive your results only by: . MyChart Message (if you have MyChart) OR . A paper copy in the mail If you have any lab test that is abnormal or we need to change your treatment, we will call you to review the results.  Testing/Procedures: NONE   Follow-Up: At [facility], you and your health needs are our priority.  As part of our continuing mission to provide you with exceptional heart care, we have created designated Provider Care Teams.  These Care Teams include your primary Cardiologist (physician) and Advanced Practice Providers (APPs -  Physician Assistants and Nurse Practitioners) who all work together to provide you with the care you need, when you need it. You will need a follow up appointment in 1 years.  Please call our office 2 months in advance to schedule this appointment.  You may see  or one of the following Advanced Practice Providers on your designated Care Team:   Amber Seiler, NP . Renee Ursuy, PA-C  Any Other Special Instructions Will Be Listed Below (If Applicable). Thank you for choosing [facility]!     

## 2019-11-28 ENCOUNTER — Telehealth: Payer: Self-pay

## 2019-11-28 NOTE — Telephone Encounter (Signed)
Call received from funeral home.  Requesting signature for death certificate.  Per Dr. Corliss Parish he will sign.  They will mail to [address] office.

## 2019-12-04 ENCOUNTER — Telehealth: Payer: Self-pay | Admitting: Internal Medicine

## 2019-12-04 NOTE — Telephone Encounter (Signed)
Signed death certificate mailed to [facility] Vital Records at: [address], [place] [address] 12/04/19 vlm

## 2019-12-19 DEATH — deceased

## 2020-08-26 IMAGING — CT CT ANGIO NECK
1 of 9 series · 5 of 33 positions shown · IV contrast (Isovue)
Comparison: Head CT without contrast 6400 hours today.

CLINICAL DATA: 62-year-old code stroke presentation. Right side
weakness since 9299 hours.

EXAM:
CT ANGIOGRAPHY HEAD AND NECK
TECHNIQUE: Multidetector CT imaging of the head and neck was performed using
the standard protocol during bolus administration of intravenous
contrast. Multiplanar CT image reconstructions and MIPs were
obtained to evaluate the vascular anatomy. Carotid stenosis
measurements (when applicable) are obtained utilizing NASCET
criteria, using the distal internal carotid diameter as the
denominator.
CONTRAST:  75mL [id number] IOPAMIDOL ([id number]) INJECTION 76%

[Series 9: ax thin · axial · 0.39mm/px · z∈[-147,+98]mm · 5 of 369 slices shown]
[im 62/369  soft-tissue]
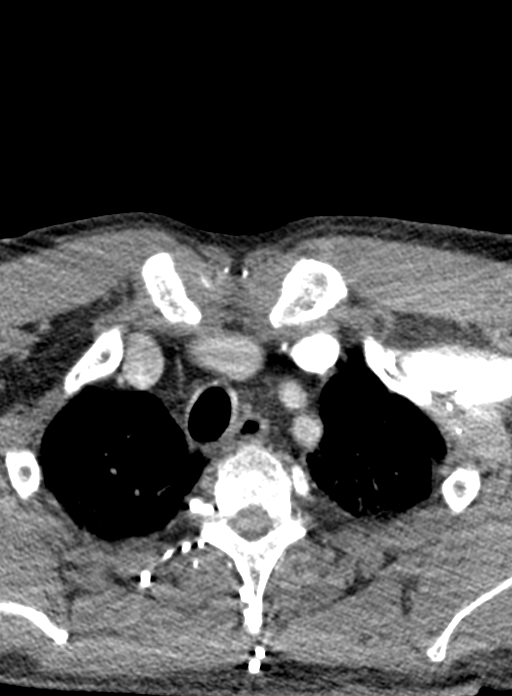
[im 123/369  bone]
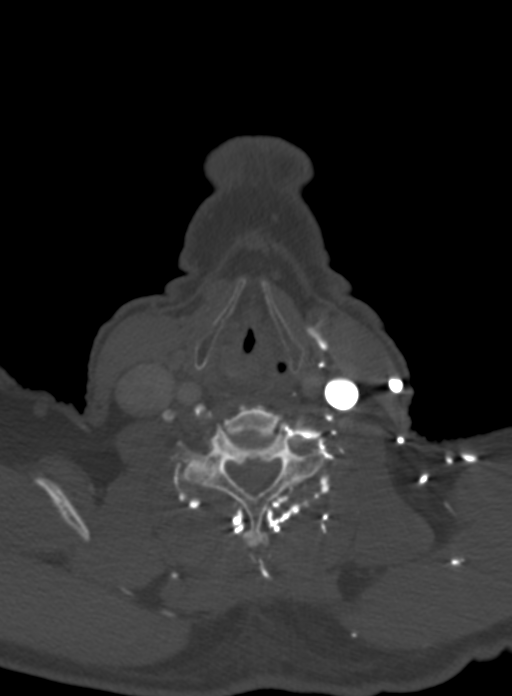
[im 185/369  soft-tissue]
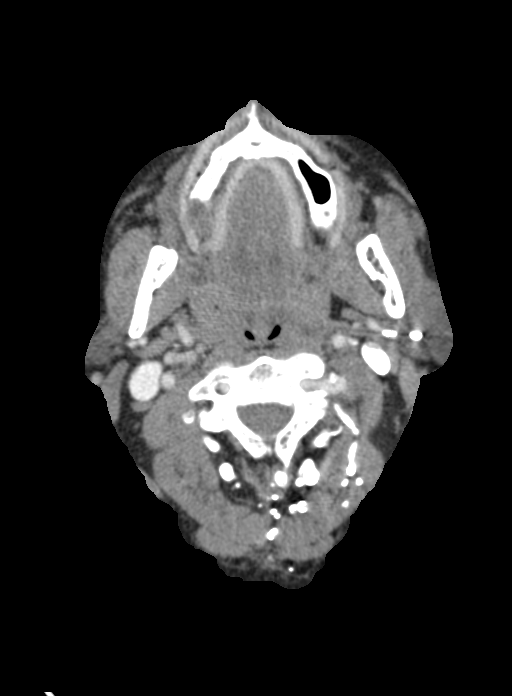
[im 246/369  bone]
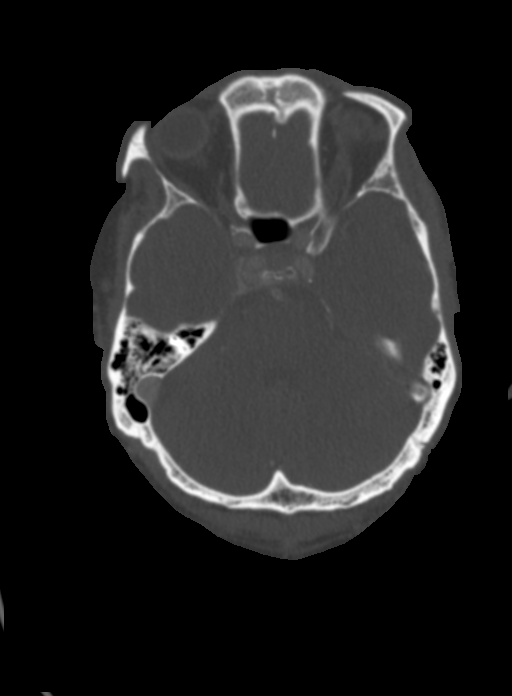
[im 307/369  soft-tissue]
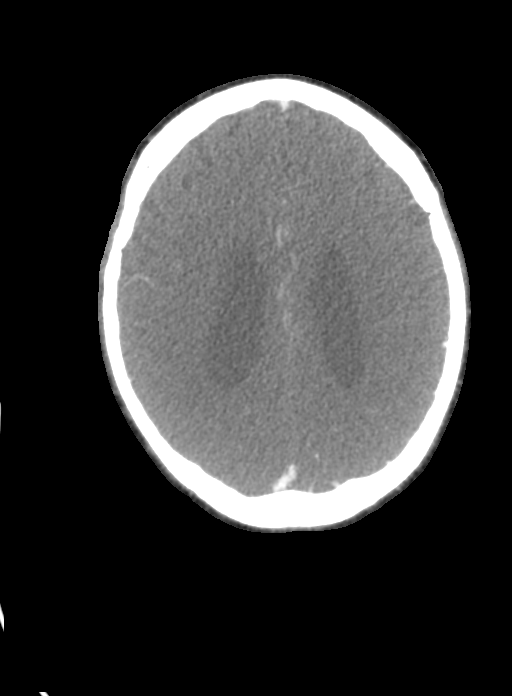

[5 of 33 positions shown; findings below may reference images not displayed]

FINDINGS: CTA NECK

Skeleton: Absent dentition. Lower cervical spine disc and endplate
degeneration. No acute osseous abnormality identified.

Upper chest: Left innominate vein stenosis (series 5, image 25)
which appears related to anatomy. The left upper extremity was
injected such that there is prominent reflux of venous contrast into
the neck and as far as the left sigmoid sinus. Prominent
paravertebral contrast reflux also.

Mild upper lung atelectasis. Visible mediastinal lymph nodes are
within normal limits.

Other neck: Negative. No neck mass or lymphadenopathy.

Aortic arch: Mild to moderate aortic arch atherosclerosis. Bovine
type arch configuration (normal variant).

Right carotid system: No brachiocephalic or right CCA origin
stenosis despite some atherosclerosis. Similar soft plaque in the
distal right CCA with no stenosis through the right carotid
bifurcation. Calcified plaque in the posterior right ICA bulb
without stenosis. Tortuous right ICA just below the skull base.

Left carotid system: Calcified plaque at the bovine type left CCA
origin with no significant stenosis. Prominent left IJ venous
contrast reflux and streak artifact mildly degrades detail of the
cervical left carotid. There is evidence of bulky soft plaque in the
left ICA origin and bulb on series 10, image 134 with suspected
high-grade left ICA origin stenosis approaching a radiographic
string sign. The left ICA remains patent to the skull base with mild
additional calcified plaque, no additional stenosis.

Vertebral arteries:

No proximal right subclavian artery or right vertebral artery origin
stenosis. Right vertebral artery detail in the neck limited by
paravertebral venous contrast reflux. No right vertebral artery
stenosis to the skull base is evident.

No proximal left subclavian artery or left vertebral artery origin
stenosis despite some atherosclerosis. Limited left vertebral detail
in the neck due to prominent venous contrast reflux. The left
vertebral is patent to the skull base with no stenosis evident.

CTA HEAD

Suboptimal intracranial arterial contrast bolus, probably related to
the innominate vein narrowing and venous reflux described above.

Posterior circulation:

Bilateral V4 segment calcified plaque without stenosis. Codominant
distal vertebral arteries. Patent PICA origins and vertebrobasilar
junction.

No basilar stenosis. Mild basilar ectasia. Normal SCA and PCA
origins. Posterior communicating arteries are diminutive or absent.
Bilateral PCA branches are within normal limits.

Anterior circulation: Both ICA siphons are patent with symmetric
enhancement. Both siphons are tortuous, ectatic especially in the
cavernous segments. Bilateral siphon calcified plaque but no siphon
stenosis. Normal ophthalmic artery origins. Patent carotid termini.
Normal MCA and ACA origins. Anterior communicating artery and
bilateral ACA branches are within normal limits. Right MCA M1
segment, bifurcation, and right MCA branches are within normal
limits.

Left MCA M1 segment, bifurcation, and left MCA branches are within
normal limits. No left MCA branch occlusion is identified to
correspond to the question Vessel hyperdensity by CT today.

Venous sinuses: Patent. Prominent reflux of the injected contrast
into the left IJ and even into the left sigmoid sinus.

Anatomic variants: Bovine aortic arch configuration.

Delayed phase: No abnormal enhancement identified. Gray-white matter
differentiation is stable since 6400 hours and within normal limits.

Review of the MIP images confirms the above findings
IMPRESSION: 1. Negative for large vessel occlusion. No left MCA branch occlusion
or stenosis is evident.
2. Suspected bulky soft plaque at the Left ICA origin and bulb with
High-Grade Left ICA origin stenosis approaching a RADIOGRAPHIC
STRING SIGN.
However, substantial venous contrast reflux in the left neck
(related to anatomic left innominate vein stenosis) with streak
artifact does limit detail of the left carotid.
Therefore corroboration of the high-grade stenosis with Carotid
Doppler US may be valuable.

3. No other hemodynamically significant stenosis in the head or
neck. Generalized arterial tortuosity.
4. Stable CT appearance of the brain since 6400 hours today.

Study discussed by telephone with Dr. DAYMI BARTH on 10/23/2018

## 2020-11-03 IMAGING — DX DG CHEST 2V
2 series · 2 of 2 positions shown · non-contrast
Comparison: 03/28/2018, 03/26/2018.

CLINICAL DATA: Acute onset of shortness of breath. Current smoker.

EXAM:
CHEST - 2 VIEW

[chest pa]
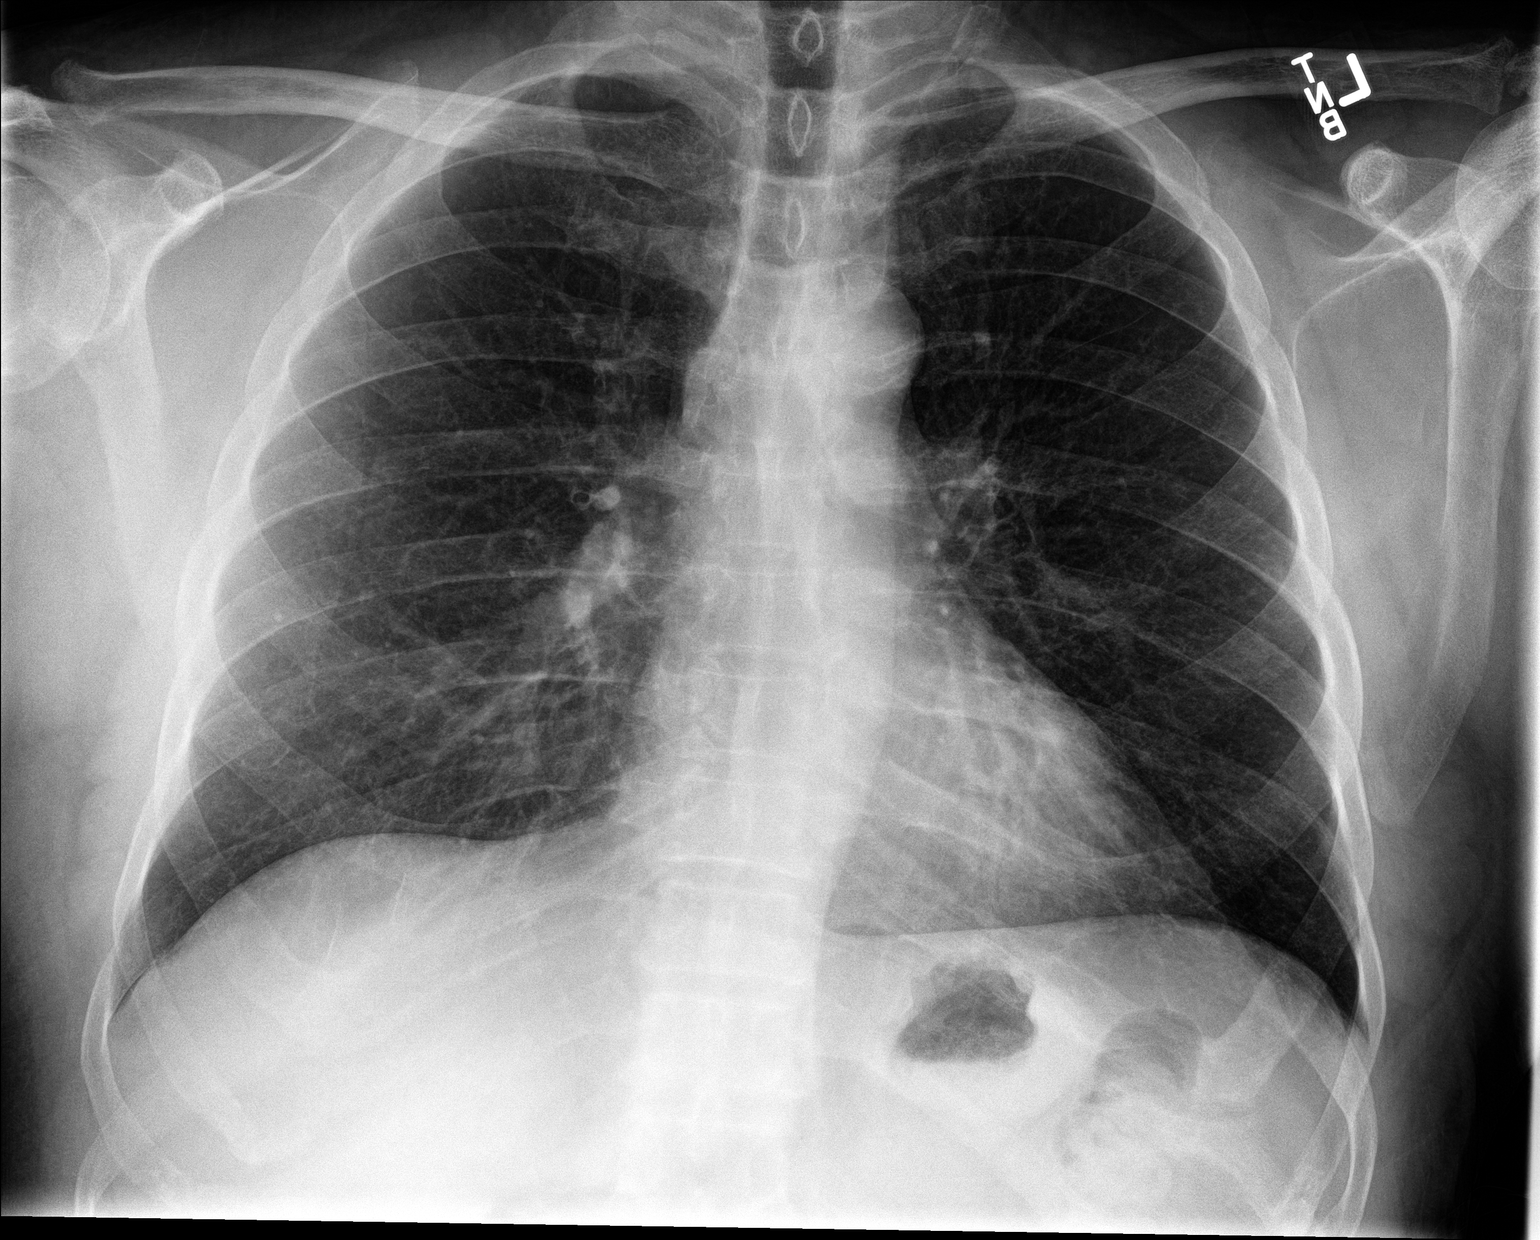

[chest lat]
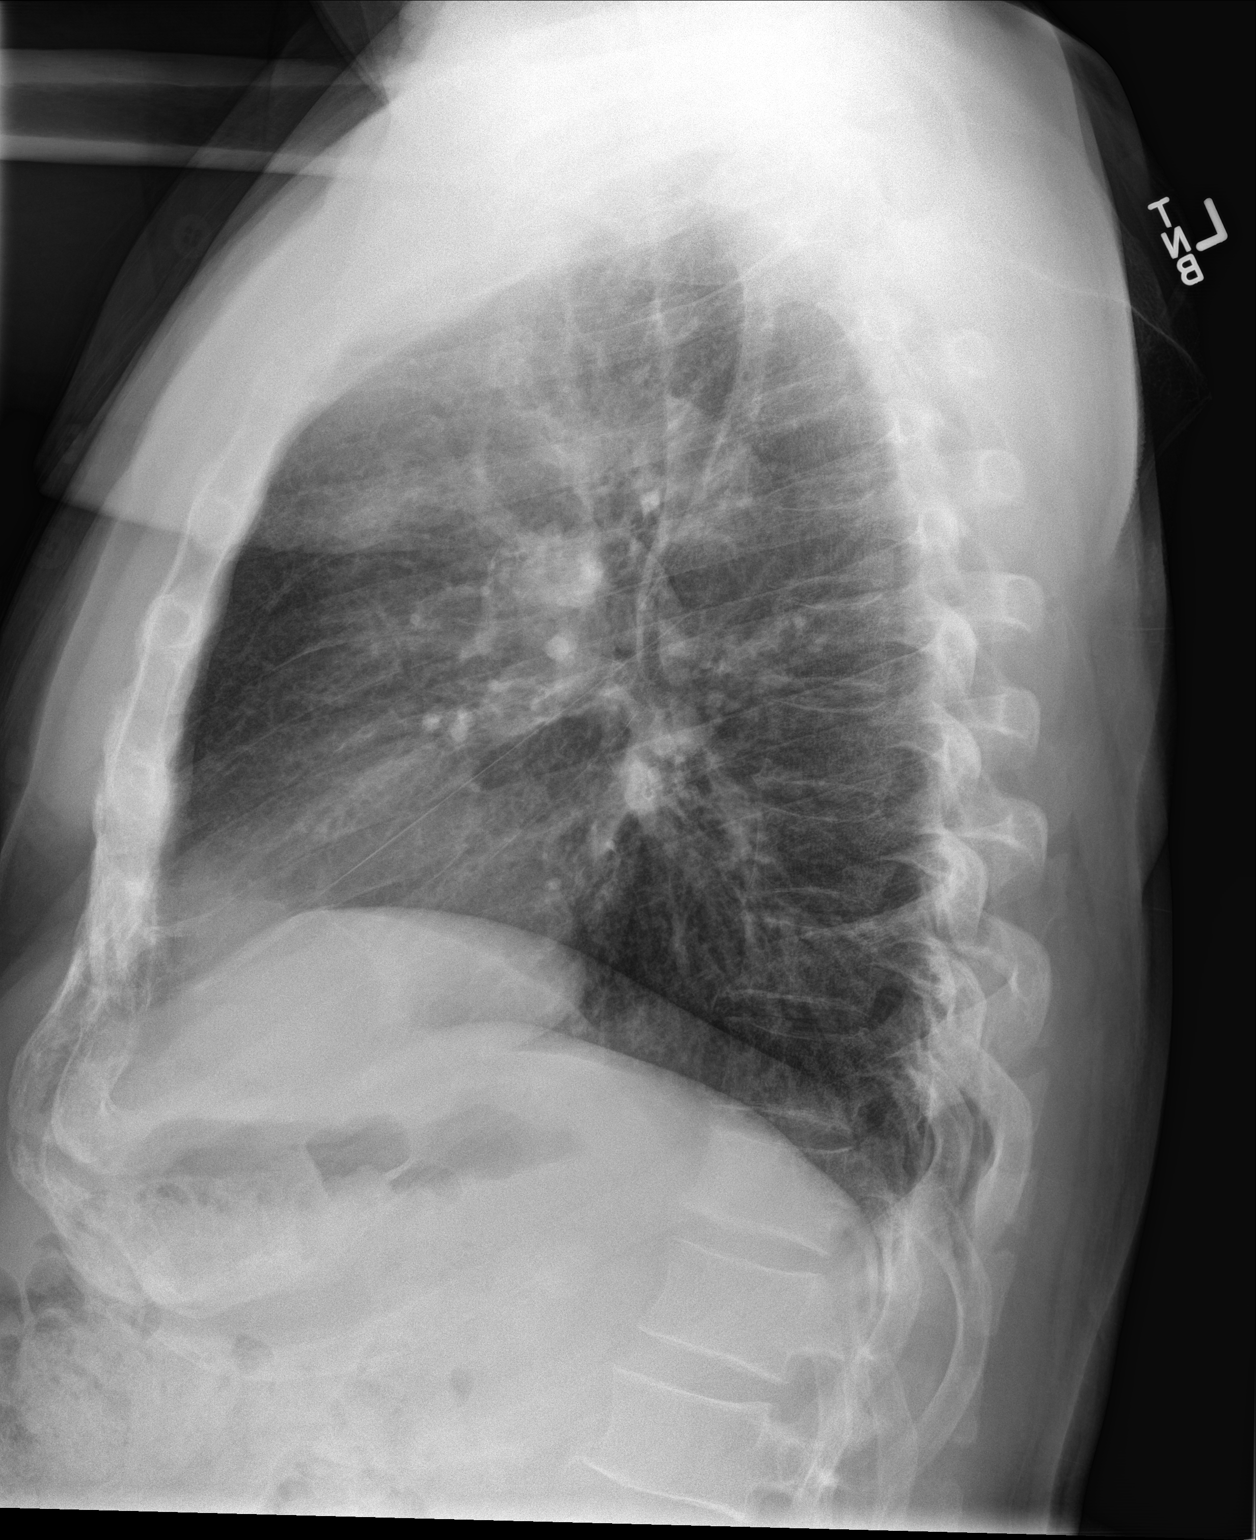

[2 of 2 positions shown; findings below may reference images not displayed]

FINDINGS: Cardiac silhouette normal in size, unchanged. Thoracic aorta mildly
atherosclerotic. Hilar and mediastinal contours otherwise
unremarkable. Emphysematous changes in the UPPER lobes. Mildly
prominent bronchovascular markings diffusely and moderate central
peribronchial thickening, unchanged. Lungs otherwise clear. No
localized airspace consolidation. No pleural effusions. No
pneumothorax. Normal pulmonary vascularity. Visualized bony thorax
intact.
IMPRESSION: 1. No acute cardiopulmonary disease.
2.  Emphysema. ([id number])
